# Patient Record
Sex: Female | Born: 1940 | Race: White | Hispanic: No | Smoking: Never smoker
Health system: Southern US, Community
[De-identification: ages and names within clinical notes are randomized; demographics above are authoritative.]

## PROBLEM LIST (undated history)

## (undated) DIAGNOSIS — IMO0002 Reserved for concepts with insufficient information to code with codable children: Secondary | ICD-10-CM

## (undated) DIAGNOSIS — E119 Type 2 diabetes mellitus without complications: Secondary | ICD-10-CM

## (undated) DIAGNOSIS — D649 Anemia, unspecified: Secondary | ICD-10-CM

## (undated) DIAGNOSIS — J302 Other seasonal allergic rhinitis: Secondary | ICD-10-CM

## (undated) DIAGNOSIS — T753XXA Motion sickness, initial encounter: Secondary | ICD-10-CM

## (undated) DIAGNOSIS — I219 Acute myocardial infarction, unspecified: Secondary | ICD-10-CM

## (undated) DIAGNOSIS — K219 Gastro-esophageal reflux disease without esophagitis: Secondary | ICD-10-CM

## (undated) DIAGNOSIS — N189 Chronic kidney disease, unspecified: Secondary | ICD-10-CM

## (undated) DIAGNOSIS — H269 Unspecified cataract: Secondary | ICD-10-CM

## (undated) DIAGNOSIS — I10 Essential (primary) hypertension: Secondary | ICD-10-CM

## (undated) DIAGNOSIS — T7840XA Allergy, unspecified, initial encounter: Secondary | ICD-10-CM

## (undated) DIAGNOSIS — M1990 Unspecified osteoarthritis, unspecified site: Secondary | ICD-10-CM

## (undated) DIAGNOSIS — Z5189 Encounter for other specified aftercare: Secondary | ICD-10-CM

## (undated) HISTORY — PX: BACK SURGERY: SHX140

## (undated) HISTORY — DX: Type 2 diabetes mellitus without complications: E11.9

## (undated) HISTORY — PX: ABDOMINAL HYSTERECTOMY: SHX81

## (undated) HISTORY — PX: BREAST CYST ASPIRATION: SHX578

## (undated) HISTORY — DX: Unspecified cataract: H26.9

## (undated) HISTORY — DX: Acute myocardial infarction, unspecified: I21.9

## (undated) HISTORY — DX: Allergy, unspecified, initial encounter: T78.40XA

## (undated) HISTORY — PX: OTHER SURGICAL HISTORY: SHX169

## (undated) HISTORY — PX: SPINE SURGERY: SHX786

## (undated) HISTORY — DX: Encounter for other specified aftercare: Z51.89

## (undated) HISTORY — DX: Reserved for concepts with insufficient information to code with codable children: IMO0002

## (undated) HISTORY — PX: DILATION AND CURETTAGE OF UTERUS: SHX78

---
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
## 1960-10-11 HISTORY — PX: NOSE SURGERY: SHX723

## 2005-01-19 ENCOUNTER — Ambulatory Visit: Payer: Self-pay | Admitting: Family Medicine

## 2005-04-23 ENCOUNTER — Ambulatory Visit: Payer: Self-pay | Admitting: Internal Medicine

## 2005-04-23 LAB — HM COLONOSCOPY

## 2006-02-18 ENCOUNTER — Ambulatory Visit: Payer: Self-pay | Admitting: Family Medicine

## 2006-06-30 ENCOUNTER — Ambulatory Visit: Payer: Self-pay | Admitting: Gastroenterology

## 2006-08-02 ENCOUNTER — Ambulatory Visit: Payer: Self-pay | Admitting: Gastroenterology

## 2007-04-11 ENCOUNTER — Ambulatory Visit: Payer: Self-pay | Admitting: Family Medicine

## 2007-09-14 ENCOUNTER — Ambulatory Visit: Payer: Self-pay | Admitting: Family Medicine

## 2007-09-20 ENCOUNTER — Ambulatory Visit: Payer: Self-pay | Admitting: Family Medicine

## 2007-10-25 ENCOUNTER — Ambulatory Visit: Payer: Self-pay | Admitting: Pain Medicine

## 2007-11-07 ENCOUNTER — Ambulatory Visit: Payer: Self-pay | Admitting: Pain Medicine

## 2007-11-23 ENCOUNTER — Ambulatory Visit: Payer: Self-pay | Admitting: Physician Assistant

## 2007-12-05 ENCOUNTER — Ambulatory Visit: Payer: Self-pay | Admitting: Pain Medicine

## 2007-12-19 ENCOUNTER — Ambulatory Visit: Payer: Self-pay | Admitting: Pain Medicine

## 2008-01-09 ENCOUNTER — Ambulatory Visit: Payer: Self-pay | Admitting: Physician Assistant

## 2008-01-23 ENCOUNTER — Ambulatory Visit: Payer: Self-pay | Admitting: Pain Medicine

## 2008-02-13 ENCOUNTER — Ambulatory Visit: Payer: Self-pay | Admitting: Pain Medicine

## 2008-04-05 ENCOUNTER — Ambulatory Visit: Payer: Self-pay | Admitting: Urology

## 2008-04-16 ENCOUNTER — Ambulatory Visit: Payer: Self-pay | Admitting: Family Medicine

## 2008-04-26 ENCOUNTER — Ambulatory Visit: Payer: Self-pay | Admitting: Urology

## 2009-04-09 LAB — HM DEXA SCAN

## 2009-04-17 ENCOUNTER — Ambulatory Visit: Payer: Self-pay | Admitting: Family Medicine

## 2010-07-06 ENCOUNTER — Ambulatory Visit: Payer: Self-pay | Admitting: Family Medicine

## 2011-08-31 ENCOUNTER — Ambulatory Visit: Payer: Self-pay | Admitting: Family Medicine

## 2012-09-05 ENCOUNTER — Ambulatory Visit: Payer: Self-pay | Admitting: Family Medicine

## 2012-09-19 ENCOUNTER — Ambulatory Visit: Payer: Self-pay | Admitting: Family Medicine

## 2013-05-11 ENCOUNTER — Inpatient Hospital Stay: Payer: Self-pay | Admitting: Cardiology

## 2013-05-11 ENCOUNTER — Other Ambulatory Visit: Payer: Self-pay | Admitting: Cardiology

## 2013-05-11 LAB — BASIC METABOLIC PANEL
Anion Gap: 2 — ABNORMAL LOW (ref 7–16)
Chloride: 105 mmol/L (ref 98–107)
Co2: 33 mmol/L — ABNORMAL HIGH (ref 21–32)
EGFR (African American): 56 — ABNORMAL LOW
Glucose: 103 mg/dL — ABNORMAL HIGH (ref 65–99)
Sodium: 140 mmol/L (ref 136–145)

## 2013-05-11 LAB — CBC WITH DIFFERENTIAL/PLATELET
Eosinophil %: 3.2 %
HCT: 39.1 % (ref 35.0–47.0)
HGB: 13.8 g/dL (ref 12.0–16.0)
Lymphocyte %: 22.4 %
MCV: 95 fL (ref 80–100)
Monocyte #: 0.8 x10 3/mm (ref 0.2–0.9)
Monocyte %: 9 %
Neutrophil #: 5.7 10*3/uL (ref 1.4–6.5)
Neutrophil %: 64.6 %
Platelet: 321 10*3/uL (ref 150–440)
RBC: 4.13 10*6/uL (ref 3.80–5.20)
RDW: 13.5 % (ref 11.5–14.5)
WBC: 8.8 10*3/uL (ref 3.6–11.0)

## 2013-05-11 LAB — CK TOTAL AND CKMB (NOT AT ARMC)
CK, Total: 71 U/L (ref 21–215)
CK, Total: 54 U/L (ref 21–215)
CK-MB: 1.4 ng/mL (ref 0.5–3.6)
CK-MB: 1.3 ng/mL (ref 0.5–3.6)

## 2013-05-11 LAB — CK: CK, Total: 77 U/L (ref 21–215)

## 2013-05-11 LAB — TROPONIN I: Troponin-I: 4.05 ng/mL — ABNORMAL HIGH

## 2013-05-12 LAB — CK TOTAL AND CKMB (NOT AT ARMC): CK, Total: 45 U/L (ref 21–215)

## 2013-05-12 LAB — TROPONIN I: Troponin-I: 3.1 ng/mL — ABNORMAL HIGH

## 2013-05-13 LAB — BASIC METABOLIC PANEL
BUN: 15 mg/dL (ref 7–18)
Calcium, Total: 8.4 mg/dL — ABNORMAL LOW (ref 8.5–10.1)
Creatinine: 0.99 mg/dL (ref 0.60–1.30)
EGFR (African American): >60
Glucose: 95 mg/dL (ref 65–99)
Osmolality: 284 (ref 275–301)

## 2013-05-13 LAB — CBC WITH DIFFERENTIAL/PLATELET
Basophil %: 0.9 %
Eosinophil #: 0.3 10*3/uL (ref 0.0–0.7)
Eosinophil %: 2.9 %
HGB: 12.2 g/dL (ref 12.0–16.0)
Lymphocyte #: 2.3 10*3/uL (ref 1.0–3.6)
Lymphocyte %: 21.1 %
MCH: 33.2 pg (ref 26.0–34.0)
MCV: 96 fL (ref 80–100)
Monocyte %: 9.4 %
Platelet: 295 10*3/uL (ref 150–440)
RBC: 3.66 10*6/uL — ABNORMAL LOW (ref 3.80–5.20)
RDW: 13.6 % (ref 11.5–14.5)

## 2013-05-14 DIAGNOSIS — I2510 Atherosclerotic heart disease of native coronary artery without angina pectoris: Secondary | ICD-10-CM | POA: Insufficient documentation

## 2013-05-16 DIAGNOSIS — Z9889 Other specified postprocedural states: Secondary | ICD-10-CM | POA: Insufficient documentation

## 2013-05-17 DIAGNOSIS — Z951 Presence of aortocoronary bypass graft: Secondary | ICD-10-CM | POA: Insufficient documentation

## 2013-05-17 HISTORY — PX: CORONARY ARTERY BYPASS GRAFT: SHX141

## 2013-05-20 DIAGNOSIS — I519 Heart disease, unspecified: Secondary | ICD-10-CM | POA: Insufficient documentation

## 2013-05-21 DIAGNOSIS — D696 Thrombocytopenia, unspecified: Secondary | ICD-10-CM | POA: Insufficient documentation

## 2013-05-21 DIAGNOSIS — IMO0002 Reserved for concepts with insufficient information to code with codable children: Secondary | ICD-10-CM | POA: Insufficient documentation

## 2013-06-08 DIAGNOSIS — J90 Pleural effusion, not elsewhere classified: Secondary | ICD-10-CM | POA: Insufficient documentation

## 2013-06-29 ENCOUNTER — Encounter: Payer: Self-pay | Admitting: Cardiology

## 2013-07-11 ENCOUNTER — Encounter: Payer: Self-pay | Admitting: Cardiology

## 2013-08-11 ENCOUNTER — Encounter: Payer: Self-pay | Admitting: Cardiology

## 2013-10-11 HISTORY — PX: EYE SURGERY: SHX253

## 2013-12-13 ENCOUNTER — Ambulatory Visit: Payer: Self-pay | Admitting: Ophthalmology

## 2013-12-13 DIAGNOSIS — Z01810 Encounter for preprocedural cardiovascular examination: Secondary | ICD-10-CM

## 2013-12-13 DIAGNOSIS — I10 Essential (primary) hypertension: Secondary | ICD-10-CM

## 2013-12-13 LAB — HEMOGLOBIN: HGB: 13.9 g/dL (ref 12.0–16.0)

## 2013-12-13 LAB — POTASSIUM: POTASSIUM: 4.5 mmol/L (ref 3.5–5.1)

## 2013-12-24 ENCOUNTER — Ambulatory Visit: Payer: Self-pay | Admitting: Ophthalmology

## 2014-01-09 ENCOUNTER — Ambulatory Visit: Payer: Self-pay | Admitting: Ophthalmology

## 2014-01-09 LAB — POTASSIUM: Potassium: 4.3 mmol/L (ref 3.5–5.1)

## 2014-01-14 ENCOUNTER — Ambulatory Visit: Payer: Self-pay | Admitting: Ophthalmology

## 2014-02-21 ENCOUNTER — Ambulatory Visit: Payer: Self-pay | Admitting: Family Medicine

## 2014-02-21 LAB — HM MAMMOGRAPHY

## 2014-04-15 DIAGNOSIS — I219 Acute myocardial infarction, unspecified: Secondary | ICD-10-CM | POA: Insufficient documentation

## 2014-04-15 DIAGNOSIS — I252 Old myocardial infarction: Secondary | ICD-10-CM | POA: Insufficient documentation

## 2014-05-08 LAB — HEMOGLOBIN A1C: Hgb A1c MFr Bld: 5.5 % (ref 4.0–6.0)

## 2014-05-10 LAB — BASIC METABOLIC PANEL
BUN: 19 mg/dL (ref 4–21)
Creatinine: 1.2 mg/dL — AB (ref 0.5–1.1)
GLUCOSE: 94 mg/dL
POTASSIUM: 4.8 mmol/L (ref 3.4–5.3)
Sodium: 138 mmol/L (ref 137–147)

## 2014-05-10 LAB — CBC AND DIFFERENTIAL
HCT: 43 % (ref 36–46)
Hemoglobin: 15.2 g/dL (ref 12.0–16.0)
NEUTROS ABS: 3 /uL
PLATELETS: 317 10*3/uL (ref 150–399)
WBC: 6.1 10^3/mL

## 2014-05-10 LAB — HEPATIC FUNCTION PANEL
ALT: 13 U/L (ref 7–35)
AST: 21 U/L (ref 13–35)
Alkaline Phosphatase: 44 U/L (ref 25–125)
BILIRUBIN, TOTAL: 0.8 mg/dL

## 2014-05-10 LAB — LIPID PANEL
CHOLESTEROL: 185 mg/dL (ref 0–200)
HDL: 55 mg/dL (ref 35–70)
LDL Cholesterol: 106 mg/dL
Triglycerides: 118 mg/dL (ref 40–160)

## 2014-05-10 LAB — TSH: TSH: 1.34 u[IU]/mL (ref 0.41–5.90)

## 2014-10-23 DIAGNOSIS — Z951 Presence of aortocoronary bypass graft: Secondary | ICD-10-CM | POA: Diagnosis not present

## 2014-10-23 DIAGNOSIS — I214 Non-ST elevation (NSTEMI) myocardial infarction: Secondary | ICD-10-CM | POA: Diagnosis not present

## 2014-10-23 DIAGNOSIS — I25810 Atherosclerosis of coronary artery bypass graft(s) without angina pectoris: Secondary | ICD-10-CM | POA: Diagnosis not present

## 2014-10-23 DIAGNOSIS — I10 Essential (primary) hypertension: Secondary | ICD-10-CM | POA: Diagnosis not present

## 2015-01-31 NOTE — Discharge Summary (Signed)
PATIENT NAME:  Sheila Kim, GARROW MR#:  [id number] DATE OF BIRTH:  1941/08/12  DISCHARGE / TRANSFER SUMMARY  DATE OF ADMISSION:  05/11/2013 DATE OF DISCHARGE:  05/14/2013  PRIMARY CARE PHYSICIAN:  Venia Minks.  FINAL DIAGNOSES: 1.  Probable anterior myocardial infarction  2.  Hyperlipidemia.  3.  Gastroesophageal reflux disease.  4.  Osteoarthritis.   PROCEDURES: Cardiac catheterization, with selective coronary arteriography on 05/14/2013.   HISTORY OF PRESENT ILLNESS: Please see Admission H and [place] COURSE: The patient was admitted on 05/11/2013 with a recent history of chest pain while vacationing in [place], [place]. The patient returned home only to experience a recurrent episode of chest pain at rest and with minimal exertion. Initial troponin was 4.05 and the patient was admitted to telemetry. Admission EKG was notable for T wave inversions in the anterior leads.   The patient underwent cardiac catheterization on 05/14/2013 which revealed a very complex high-grade 99% stenosis in the mid-left anterior descending coronary artery involving the takeoff of the second anterolateral branch. In addition, there was a 95% stenosis in the ostium of the first anterolateral branch, a 75% stenosis in the proximal left circumflex, and an 80% stenosis in the mid-posterior descending artery.   In summary, the patient has significant three-vessel coronary artery disease. Left ventriculography revealed preserved left ventricular function with mild apical wall akinesis. The patient is being transferred to [facility] for urgent coronary artery bypass graft surgery. She is transferred in stable condition.    ____________________________ Isaias Cowman, MD ap:dm D: 05/14/2013 08:45:00 ET T: 05/14/2013 09:08:24 ET JOB#: [id number]  cc: Isaias Cowman, MD, <Dictator> Isaias Cowman MD ELECTRONICALLY SIGNED 06/06/2013 15:59

## 2015-01-31 NOTE — Consult Note (Signed)
Chief Complaint:  Subjective/Chief Complaint Doing well denies further cp symptoms. States to Sheila Kim the risks and benefits to cath.   VITAL SIGNS/ANCILLARY NOTES: **Vital Signs.:   03-Aug-14 12:32  Pulse Pulse 80  Systolic BP Systolic BP 257  Diastolic BP (mmHg) Diastolic BP (mmHg) 76  Mean BP 92  *Intake and Output.:   03-Aug-14 11:38  Grand Totals Intake:   Output:  300    Net:  -300 24 Hr.:  -300  Urine ml     Out:  300  Urinary Method  Up to BR  Stool  large bowel movement   Brief Assessment:  GEN well developed, well nourished, no acute distress   Cardiac Regular  murmur present  --Gallop   Respiratory normal resp effort  clear BS   Gastrointestinal Normal   Gastrointestinal details normal Soft   Lab Results: Routine Chem:  03-Aug-14 04:52   Glucose, Serum 95  BUN 15  Creatinine (comp) 0.99  Sodium, Serum 142  Potassium, Serum 3.9  Chloride, Serum  109  CO2, Serum 29  Calcium (Total), Serum  8.4  Anion Gap  4  Osmolality (calc) 284  eGFR (African American) >60  eGFR (Non-African American)  56 (eGFR values <66m/min/1.73 m2 may be an indication of chronic kidney disease (CKD). Calculated eGFR is useful in patients with stable renal function. The eGFR calculation will not be reliable in acutely ill patients when serum creatinine is changing rapidly. It is not useful in  patients on dialysis. The eGFR calculation may not be applicable to patients at the low and high extremes of body sizes, pregnant women, and vegetarians.)  Routine Hem:  03-Aug-14 04:52   WBC (CBC) 11.0  RBC (CBC)  3.66  Hemoglobin (CBC) 12.2  Hematocrit (CBC) 35.1  Platelet Count (CBC) 295  MCV 96  MCH 33.2  MCHC 34.7  RDW 13.6  Neutrophil % 65.7  Lymphocyte % 21.1  Monocyte % 9.4  Eosinophil % 2.9  Basophil % 0.9  Neutrophil #  7.2  Lymphocyte # 2.3  Monocyte #  1.0  Eosinophil # 0.3  Basophil # 0.1 (Result(s) reported on 13 May 2013 at 0[facility])   Radiology  Results: XRay:    01-Aug-14 13:48, Chest PA and Lateral  Chest PA and Lateral   REASON FOR EXAM:    SOB  COMMENTS:       PROCEDURE: DXR - DXR CHEST PA (OR AP) AND LATERAL  - May 11 2013  1:48PM     RESULT: Atherosclerotic calcification is present within the aorta. The   heart size is normal. There is some lobulation on the rightside of the   diaphragm. There is no edema, infiltrate, effusion or pneumothorax. The   bony structures appear intact. Cardiac monitoring electrodes are present.    IMPRESSION:   1. No acute cardiopulmonary disease evident.    Dictation Site: 2    Verified By: GSundra Aland M.D., MD   Assessment/Plan:  Invasive Device Daily Assessment of Necessity:  Does the patient currently have any of the following indwelling devices? none   Assessment/Plan:  Assessment IMP S/P Acute MI anterior Abn Ekg HTN Possible hyperlipidemia Elevated troponin .   Plan PLAN Tele ASA 81 mg daily Plavix 75 daily Low dose B-blockers ACE low dose DVT PROPHYLASIX Hold off on NTG since no cp currently ECHO Cardiac cath MOnday with AP   Electronic Signatures: CYolonda Kida(MD)  (Signed 04-Aug-14 07:02)  Authored: Chief Complaint, VITAL SIGNS/ANCILLARY NOTES, Brief Assessment,  Lab Results, Radiology Results, Assessment/Plan   Last Updated: 04-Aug-14 07:02 by Lujean Amel D (MD)

## 2015-01-31 NOTE — H&P (Signed)
PATIENT NAME:  Sheila Kim, Sheila Kim MR#:  [id number] DATE OF BIRTH:  [birth year]  DATE OF ADMISSION:  05/11/2013  PRIMARY CARE PHYSICIAN: Jerrell Belfast, MD  CHIEF COMPLAINT: Chest pain.   REASON FOR ADMISSION: The patient admitted for probable anterior myocardial infarction.   HISTORY OF PRESENT ILLNESS: The patient is a 74 year old female without history of prior coronary artery disease. The patient apparently was in her usual state of health until recently, when she was vacationing in [place], [place], from 04/27/2013 to 05/08/2013. The patient started to experience chest discomfort when she first boarded the airplane to [place], [place]. While she there, she was experiencing exertional substernal chest discomfort. On her return to the airport, the patient had another prolonged episode of chest pain which lasted several hours. After returning home, the patient has had some recurrent episodes of chest pain with exertion. Yesterday, she experienced an episode of chest pain which lasted less than 10 minutes while walking on the treadmill. She has noted associated mild exertional dyspnea   SOCIAL HISTORY: The patient is married. She lives with her husband. She has 2 children. She has never smoked.   FAMILY HISTORY: Father died status post MI at age 11. Mother died at age 85 with a history of heart disease.   REVIEW OF SYSTEMS:  CONSTITUTIONAL: No fever or chills.  EYES: No blurry vision.  EARS: No hearing loss.  RESPIRATORY: No shortness of breath.  CARDIOVASCULAR: Chest pain as described above.  GASTROINTESTINAL: No nausea, vomiting or diarrhea.  GENITOURINARY: No dysuria or hematuria.  ENDOCRINE: No polyuria or polydipsia.  MUSCULOSKELETAL: No arthralgias or myalgias.  NEUROLOGICAL: No focal muscle weakness or numbness.  PSYCHOLOGICAL: No depression or anxiety.   PHYSICAL EXAMINATION:  VITAL SIGNS: Weight 117.4, height 4 feet 11 inches, BMI 24. Blood pressure 122/62 left arm, pulse 64  and regular.  HEENT: Pupils equally reactive to light and accommodation.  NECK: Supple without thyromegaly.  LUNGS: Clear.  CARDIOVASCULAR: Normal JVP. Normal PMI. Regular rate and rhythm. Normal S1, S2. No appreciable gallop, murmur or rub.  ABDOMEN: Soft and nontender. Pulses were intact bilaterally.  MUSCULOSKELETAL: Normal muscle tone.  NEUROLOGICAL: The patient is alert and oriented x3. Motor and sensory both grossly intact.   ACCESSORY DATA: EKG reveals sinus rhythm at a rate of 65 BPM with evidence of septal infarct and T wave inversions in the anterior leads.   IMPRESSION: A 74 year old female with 1-week history of recurrent episodes of chest pain with exertion and a prolonged episode at rest. Electrocardiogram reveals evidence for old anteroseptal myocardial infarction with T wave inversions suggestive of an evolving myocardial infarction. Laboratory work today included a troponin of 4.05 with negative CPK-MB. Myocardial infarction may have occurred over a week ago, with residual elevation in troponin; however, the patient has continued to experience exertional chest pain.   RECOMMENDATIONS:  1. Admit to telemetry. 2. Cycle cardiac enzymes.  3. A 2-D echocardiogram to evaluate left ventricular function.  4. Proceed to cardiac catheterization with selective coronary arteriography on 05/14/2013. The risks, benefits and alternatives were explained and informed written consent obtained.   ____________________________ Isaias Cowman, MD ap:OSi D: 05/11/2013 11:55:30 ET T: 05/11/2013 12:21:15 ET JOB#: [id number]  cc: Isaias Cowman, MD, <Dictator> Isaias Cowman MD ELECTRONICALLY SIGNED 06/06/2013 15:59

## 2015-01-31 NOTE — Consult Note (Signed)
Chief Complaint:  Subjective/Chief Complaint Denies cp no sob feels ok at rest   VITAL SIGNS/ANCILLARY NOTES: **Vital Signs.:   02-Aug-14 11:49  Vital Signs Type Q 4hr  Temperature Temperature (F) 97.8  Celsius 36.5  Temperature Source oral  Pulse Pulse 71  Respirations Respirations 16  Systolic BP Systolic BP 025  Diastolic BP (mmHg) Diastolic BP (mmHg) 82  Mean BP 100  Pulse Ox % Pulse Ox % 99  Pulse Ox Activity Level  At rest  Oxygen Delivery Room Air/ 21 %  *Intake and Output.:   02-Aug-14 11:51  Grand Totals Intake:   Output:  500    Net:  -78 24 Hr.:  -140  Urine ml     Out:  500  Urinary Method  Up to BR  Stool  moderate amount of stool   Brief Assessment:  GEN well developed, well nourished, no acute distress   Cardiac Regular  murmur present  --Gallop   Respiratory normal resp effort  clear BS   Gastrointestinal Normal   Gastrointestinal details normal Soft   Lab Results: LabObservation:  01-Aug-14 14:10   OBSERVATION Reason for Test  Routine Chem:  01-Aug-14 12:43   Result Comment TROPONIN - RESULTS VERIFIED BY REPEAT TESTING.  - C/ SUSAN PIESTO @1435  05/11/13 BY K[facility]  - READ-BACK PROCESS PERFORMED.  Result(s) reported on 11 May 2013 at 02:37PM.  Glucose, Serum  103  BUN  22  Creatinine (comp) 1.12  Sodium, Serum 140  Potassium, Serum 4.3  Chloride, Serum 105  CO2, Serum  33  Calcium (Total), Serum 9.1  Anion Gap  2  Osmolality (calc) 283  eGFR (African American)  56  eGFR (Non-African American)  48 (eGFR values <35m/min/1.73 m2 may be an indication of chronic kidney disease (CKD). Calculated eGFR is useful in patients with stable renal function. The eGFR calculation will not be reliable in acutely ill patients when serum creatinine is changing rapidly. It is not useful in  patients on dialysis. The eGFR calculation may not be applicable to patients at the low and high extremes of body sizes, pregnant women, and vegetarians.)    20:54    Result Comment TROPONIN - RESULTS VERIFIED BY REPEAT TESTING.  - ELEVATED TROPONIN PREVIOUSLY CALLED AT  - 1042 05/11/13.PMH  Result(s) reported on 11 May 2013 at 09:54PM.  02-Aug-14 04:52   Result Comment TROPONIN - RESULTS VERIFIED BY REPEAT TESTING.  - ELEVATED TROPONIN PREVIOUSLY CALLED AT  - 1042 05/11/13.PMH  Result(s) reported on 12 May 2013 at 05:57AM.  Cardiac:  01-Aug-14 12:43   Troponin I  3.61 (0.00-0.05 0.05 ng/mL or less: NEGATIVE  Repeat testing in 3-6 hrs  if clinically indicated. >0.05 ng/mL: POTENTIAL  MYOCARDIAL INJURY. Repeat  testing in 3-6 hrs if  clinically indicated. NOTE: An increase or decrease  of 30% or more on serial  testing suggests a  clinically important change)  CK, Total 71  CPK-MB, Serum 1.4 (Result(s) reported on 11 May 2013 at 01:11PM.)    20:54   Troponin I  4.10 (0.00-0.05 0.05 ng/mL or less: NEGATIVE  Repeat testing in 3-6 hrs  if clinically indicated. >0.05 ng/mL: POTENTIAL  MYOCARDIAL INJURY. Repeat  testing in 3-6 hrs if  clinically indicated. NOTE: An increase or decrease  of 30% or more on serial  testing suggests a  clinically important change)  CK, Total 54  CPK-MB, Serum 1.3 (Result(s) reported on 11 May 2013 at 09:44PM.)  02-Aug-14 04:52   Troponin I  3.10 (  0.00-0.05 0.05 ng/mL or less: NEGATIVE  Repeat testing in 3-6 hrs  if clinically indicated. >0.05 ng/mL: POTENTIAL  MYOCARDIAL INJURY. Repeat  testing in 3-6 hrs if  clinically indicated. NOTE: An increase or decrease  of 30% or more on serial  testing suggests a  clinically important change)  CK, Total 45  CPK-MB, Serum 1.3 (Result(s) reported on 12 May 2013 at 05:56AM.)  Routine Hem:  01-Aug-14 12:43   WBC (CBC) 8.8  RBC (CBC) 4.13  Hemoglobin (CBC) 13.8  Hematocrit (CBC) 39.1  Platelet Count (CBC) 321  MCV 95  MCH 33.4  MCHC 35.3  RDW 13.5  Neutrophil % 64.6  Lymphocyte % 22.4  Monocyte % 9.0  Eosinophil % 3.2  Basophil % 0.8  Neutrophil # 5.7   Lymphocyte # 2.0  Monocyte # 0.8  Eosinophil # 0.3  Basophil # 0.1 (Result(s) reported on 11 May 2013 at 01:11PM.)   Radiology Results: XRay:    01-Aug-14 13:48, Chest PA and Lateral  Chest PA and Lateral   REASON FOR EXAM:    SOB  COMMENTS:       PROCEDURE: DXR - DXR CHEST PA (OR AP) AND LATERAL  - May 11 2013  1:48PM     RESULT: Atherosclerotic calcification is present within the aorta. The   heart size is normal. There is some lobulation on the rightside of the   diaphragm. There is no edema, infiltrate, effusion or pneumothorax. The   bony structures appear intact. Cardiac monitoring electrodes are present.    IMPRESSION:   1. No acute cardiopulmonary disease evident.    Dictation Site: 2    Verified By: Sundra Aland, M.D., MD   Assessment/Plan:  Invasive Device Daily Assessment of Necessity:  Does the patient currently have any of the following indwelling devices? none   Assessment/Plan:  Assessment IMP S/P Acute MI anterior Abn Ekg HTN Possible hyperlipidemia Elevated troponin .   Plan PLAN Tele ASA 81 mg daily Plavix 75 daily Low dose B-blockers ACE low dose DVT PROPHYLASIX Hold off on NTG since no cp currently ECHO Cardiac cath MOnday with AP   Electronic Signatures: Yolonda Kida (MD)  (Signed 02-Aug-14 23:17)  Authored: Chief Complaint, VITAL SIGNS/ANCILLARY NOTES, Brief Assessment, Lab Results, Radiology Results, Assessment/Plan   Last Updated: 02-Aug-14 23:17 by Lujean Amel D (MD)

## 2015-02-01 NOTE — Op Note (Signed)
PATIENT NAME:  Sheila Kim, Sheila Kim MR#:  [id number] DATE OF BIRTH:  1941/01/13  DATE OF PROCEDURE:  12/24/2013  PREOPERATIVE DIAGNOSIS: Cataract, right eye.   POSTOPERATIVE DIAGNOSIS: Cataract, right eye.   PROCEDURE PERFORMED: Extracapsular cataract extraction using phacoemulsification with placement of Alcon SN6CWS, 23.5-diopter posterior chamber lens, serial number [id number].   SURGEON: Loura Back. Clio Gerhart, M.D.   ANESTHESIA: 4% lidocaine and 0.75% Marcaine in a 50-50 mixture with 10 units/mL of Hylenex added, given as a peribulbar.   ANESTHESIOLOGIST: Amy M. Rice, MD  COMPLICATIONS: None.   ESTIMATED BLOOD LOSS: Less than 1 mL.   DESCRIPTION OF PROCEDURE: The patient was brought to the operating room and given a peribulbar block after IV sedation. She was then prepped and draped in the usual fashion. BECAUSE OF A LATEX ALLERGY, ALL LATEX-FREE MATERIALS WERE USED. The patient was prepped and draped in the usual fashion. The vertical rectus muscles were imbricated using 5-0 silk sutures, bridle sutures, and cautery was performed at the limbus superiorly. Partial-thickness scleral groove was made at the posterior surgical limbus and dissected anteriorly into clear cornea with an Alcon crescent knife. The anterior chamber was entered superonasally through clear cornea and through the lamellar dissection with a 2.6 mm keratome. DisCoVisc was used to place the aqueous. Continuous tear capsulorrhexis was carried out. Phacoemulsification was carried out in divide-and-conquer technique. Ultrasound time was 1 minute 23 seconds with an average power of 25.5%, CDE of 36.38. The intraocular lens was inserted into the capsule. Capsular bag was inflated with DisCoVisc and the intraocular lens was inserted in the capsular bag using an AcrySert delivery system. Irrigation-aspiration was used to remove the residual DisCoVisc. The wound was inflated with balanced salt and Miochol was injected through the  paracentesis track. Cefuroxime 0.1 mg containing 1 mg of drug was also injected via the paracentesis track. The wound was checked for leaks. None were found. The bridle sutures were removed and 2 drops of Vigamox were placed in the eye. A shield was placed on the eye and the patient was discharged to the recovery room in good condition.    ____________________________ Loura Back Consuelo Thayne, MD sad:jcm D: 12/24/2013 13:45:13 ET T: 12/24/2013 21:35:23 ET JOB#: [id number]  cc: Remo Lipps A. Franciszek Platten, MD, <Dictator> Martie Lee MD ELECTRONICALLY SIGNED 12/31/2013 13:55

## 2015-02-01 NOTE — Op Note (Signed)
PATIENT NAME:  Sheila Kim, Sheila Kim MR#:  [id number] DATE OF BIRTH:  01-Aug-1941  DATE OF PROCEDURE:  01/14/2014   PREOPERATIVE DIAGNOSIS: Cataract, left eye.   POSTOPERATIVE DIAGNOSIS: Cataract, left eye.  PROCEDURE PERFORMED: Extracapsular cataract extraction using phacoemulsification with placement of Alcon SN6CWS, 23.0-diopter posterior chamber lens, serial number [id number].   SURGEON: Loura Back. Galaxy Borden, M.D.   ASSISTANT:  None.  ANESTHESIA: 4% lidocaine with 0.75% Marcaine with 50-50 mixture of 10 units/mL of Hylenex added, given as a peribulbar.   ANESTHESIOLOGIST: Dr. Benjamine Mola.   COMPLICATIONS: None.   ESTIMATED BLOOD LOSS: Less than 1 mL.   DESCRIPTION OF PROCEDURE: Latex-free material was used throughout the procedure due LATEX ALLERGY.  The patient was brought to the operating room, placed supine and given IV sedation. A peribulbar block was then performed. The patient was then prepped and draped in the usual fashion. The vertical rectus muscles were imbricated using 5-0 silk sutures, bridle sutures. Hemostasis was obtained at the limbus with cautery and a partial thickness scleral groove was made at the surgical limbus. This was dissected anteriorly into clear cornea with an Alcon crescent knife. The anterior chamber was entered superotemporally through clear cornea with a paracentesis knife and through the lamellar dissection with a 2.6 mm keratome. DisCoVisc was used to replace the aqueous and a continuous tear of circular capsulorrhexis was carried out. Hydrodissection was used to loosen the nucleus and phacoemulsification was carried out in a divide-and-conquer technique. Ultrasound time was 1 minute and 34 seconds. Average power was 23.8 and CDE was 43.17. Irrigation and aspiration were used to remove the residual cortex. The capsular bag was inflated with DisCoVisc and the intraocular lens was inserted into the capsular bag under direct visualization using a Librarian, academic.  Irrigation and aspiration was used to remove residual DisCoVisc. The wound was inflated with balanced salt and Miochol was injected via the paracentesis track.  0.1 mL of cefuroxime containing 1 mg of drug was injected through the paracentesis as well. The wound was checked for leaks and none were found. The bridle sutures were removed. Two drops of Vigamox were placed in the eye, a shield was placed on the eye, and the patient was discharged to the recovery area in good condition.    ____________________________ Loura Back Kyrese Gartman, MD sad:dmm D: 01/14/2014 12:09:45 ET T: 01/14/2014 12:24:52 ET JOB#: [id number]  cc: Remo Lipps A. Zaeda Mcferran, MD, <Dictator> Martie Lee MD ELECTRONICALLY SIGNED 01/21/2014 12:23

## 2015-02-12 DIAGNOSIS — K449 Diaphragmatic hernia without obstruction or gangrene: Secondary | ICD-10-CM

## 2015-02-12 DIAGNOSIS — N289 Disorder of kidney and ureter, unspecified: Secondary | ICD-10-CM | POA: Insufficient documentation

## 2015-02-12 DIAGNOSIS — E559 Vitamin D deficiency, unspecified: Secondary | ICD-10-CM | POA: Insufficient documentation

## 2015-02-12 DIAGNOSIS — Z981 Arthrodesis status: Secondary | ICD-10-CM | POA: Insufficient documentation

## 2015-02-12 DIAGNOSIS — J309 Allergic rhinitis, unspecified: Secondary | ICD-10-CM | POA: Insufficient documentation

## 2015-02-12 DIAGNOSIS — E7800 Pure hypercholesterolemia, unspecified: Secondary | ICD-10-CM | POA: Insufficient documentation

## 2015-02-12 DIAGNOSIS — I2510 Atherosclerotic heart disease of native coronary artery without angina pectoris: Secondary | ICD-10-CM | POA: Insufficient documentation

## 2015-02-12 DIAGNOSIS — M810 Age-related osteoporosis without current pathological fracture: Secondary | ICD-10-CM | POA: Insufficient documentation

## 2015-02-12 DIAGNOSIS — M1990 Unspecified osteoarthritis, unspecified site: Secondary | ICD-10-CM | POA: Insufficient documentation

## 2015-02-12 DIAGNOSIS — R739 Hyperglycemia, unspecified: Secondary | ICD-10-CM | POA: Insufficient documentation

## 2015-02-12 DIAGNOSIS — K219 Gastro-esophageal reflux disease without esophagitis: Secondary | ICD-10-CM | POA: Insufficient documentation

## 2015-02-12 DIAGNOSIS — N182 Chronic kidney disease, stage 2 (mild): Secondary | ICD-10-CM | POA: Insufficient documentation

## 2015-02-12 DIAGNOSIS — I130 Hypertensive heart and chronic kidney disease with heart failure and stage 1 through stage 4 chronic kidney disease, or unspecified chronic kidney disease: Secondary | ICD-10-CM | POA: Insufficient documentation

## 2015-02-12 DIAGNOSIS — N189 Chronic kidney disease, unspecified: Secondary | ICD-10-CM | POA: Insufficient documentation

## 2015-02-12 DIAGNOSIS — R7303 Prediabetes: Secondary | ICD-10-CM | POA: Insufficient documentation

## 2015-02-12 DIAGNOSIS — I10 Essential (primary) hypertension: Secondary | ICD-10-CM | POA: Insufficient documentation

## 2015-02-12 DIAGNOSIS — K5790 Diverticulosis of intestine, part unspecified, without perforation or abscess without bleeding: Secondary | ICD-10-CM | POA: Insufficient documentation

## 2015-02-12 DIAGNOSIS — K529 Noninfective gastroenteritis and colitis, unspecified: Secondary | ICD-10-CM | POA: Insufficient documentation

## 2015-02-12 DIAGNOSIS — Z8614 Personal history of Methicillin resistant Staphylococcus aureus infection: Secondary | ICD-10-CM | POA: Insufficient documentation

## 2015-03-18 ENCOUNTER — Ambulatory Visit (INDEPENDENT_AMBULATORY_CARE_PROVIDER_SITE_OTHER): Payer: Medicare Other | Admitting: Family Medicine

## 2015-03-18 ENCOUNTER — Encounter: Payer: Self-pay | Admitting: Family Medicine

## 2015-03-18 VITALS — BP 120/80 | HR 74 | Temp 98.1°F | Resp 16 | Ht <= 58 in | Wt 111.0 lb

## 2015-03-18 DIAGNOSIS — R739 Hyperglycemia, unspecified: Secondary | ICD-10-CM

## 2015-03-18 DIAGNOSIS — Z0000 Encounter for general adult medical examination without abnormal findings: Secondary | ICD-10-CM | POA: Diagnosis not present

## 2015-03-18 DIAGNOSIS — I10 Essential (primary) hypertension: Secondary | ICD-10-CM | POA: Diagnosis not present

## 2015-03-18 DIAGNOSIS — E7800 Pure hypercholesterolemia, unspecified: Secondary | ICD-10-CM

## 2015-03-18 DIAGNOSIS — E559 Vitamin D deficiency, unspecified: Secondary | ICD-10-CM

## 2015-03-18 DIAGNOSIS — N182 Chronic kidney disease, stage 2 (mild): Secondary | ICD-10-CM

## 2015-03-18 NOTE — Progress Notes (Signed)
Patient ID: Sheila Kim, female   DOB: Aug 29, 1941, 74 y.o.   MRN: [id number] Patient: Sheila Kim, Female    DOB: August 13, 1941, 74 y.o.   MRN: [id number] Visit Date: 03/18/2015  Today's Provider: Margarita Rana, MD   Chief Complaint  Patient presents with  . Medicare Wellness   Subjective:    Annual wellness visit Sheila Kim is a 74 y.o. female who presents today for her Subsequent Annual Wellness Visit. She feels well. She reports exercising 6 days a week. She reports she is sleeping fairly well. 04/09/2009 BMD: osteoporosis 02/21/2014 Mammo: BI-RADS 1 07/16/2014 Prevnar 13 08/21/2005 Pneumovax 07/01/2011 Tdap 10/01/2011 Zoster    Review of Systems  Constitutional: Negative.   HENT: Negative.   Eyes: Positive for redness and itching.  Respiratory: Negative.   Cardiovascular: Negative.   Gastrointestinal: Negative.   Endocrine: Negative.   Genitourinary: Positive for enuresis.  Musculoskeletal: Positive for myalgias and back pain.  Skin: Negative.   Allergic/Immunologic: Positive for environmental allergies.  Neurological: Positive for dizziness and tremors.  Hematological: Negative.   Psychiatric/Behavioral: Negative.     History   Social History  . Marital Status: Married    Spouse Name: Hassell Done  . Number of Children: 2  . Years of Education: N/A   Occupational History  . retired    Social History Main Topics  . Smoking status: Never Smoker   . Smokeless tobacco: Never Used  . Alcohol Use: No  . Drug Use: No  . Sexual Activity: Not on file   Other Topics Concern  . Not on file   Social History Narrative    Patient Active Problem List   Diagnosis Date Noted  . Allergic rhinitis 02/12/2015  . Atherosclerosis of coronary artery 02/12/2015  . Chronic kidney disease (CKD), stage II (mild) 02/12/2015  . Colitis 02/12/2015  . DD (diverticular disease) 02/12/2015  . Gastroesophageal reflux disease with hiatal hernia 02/12/2015  .  Personal history of methicillin resistant Staphylococcus aureus 02/12/2015  . H/O arthrodesis 02/12/2015  . Hypercholesteremia 02/12/2015  . Blood glucose elevated 02/12/2015  . BP (high blood pressure) 02/12/2015  . Heart & renal disease, hypertensive, with heart failure 02/12/2015  . Arthritis, degenerative 02/12/2015  . OP (osteoporosis) 02/12/2015  . Impaired renal function 02/12/2015  . Avitaminosis D 02/12/2015  . Heart attack 04/15/2014  . Pleural cavity effusion 06/08/2013  . Change in blood platelet count 05/21/2013  . Disorder of right ventricle of heart 05/20/2013  . H/O coronary artery bypass surgery 05/17/2013  . Arteriosclerosis of coronary artery 05/14/2013    Past Surgical History  Procedure Laterality Date  . 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 Back surgery    . Ureteropelvic junction obstruction    . Abdominal hysterectomy    . Nose surgery  1962    deviated septum, hemorrhaged- pt had to return to hospital  . Tonsillectomy  1944  . Dilation and curettage of uterus    . Eye surgery Bilateral 2015    cataract    Her family history includes COPD in her father; Diabetes in her father, mother, and sister; Heart attack in her father and mother; Heart disease in her mother and sister; Stroke in her brother.    Previous Medications   ACETAMINOPHEN (TYLENOL) 500 MG TABLET    Take by mouth.   ALPHA-D-GALACTOSIDASE (BEANO PO)    Take by mouth.   ALPHA-D-GALACTOSIDASE (BEANO TO GO) TABS    Take by mouth.  ASPIRIN 81 MG TABLET    Take by mouth.   CALCIUM CITRATE PO    Take by mouth.   CHOLECALCIFEROL (VITAMIN D) 1000 UNITS TABLET    Take by mouth.   COENZYME Q10 (COQ10) 100 MG CAPS    Take by mouth.   CYANOCOBALAMIN 1000 MCG TABLET    Take by mouth.   DIFLUPREDNATE 0.05 % EMUL    Apply to eye.   FERROUS SULFATE 325 (65 FE) MG TABLET    Take by mouth.   FOLIC ACID (FOLVITE) 1 MG TABLET    Take by mouth.   FUROSEMIDE (LASIX) 40 MG TABLET     Take by mouth.   KETOTIFEN (ZADITOR) 0.025 % OPHTHALMIC SOLUTION    1 drop 2 (two) times daily.   LIDOCAINE (LIDODERM) 5 %    LIDODERM, 5% (External Patch)  one to three Patch Patch Patch may apply up to 3 x 12 hours then off 12 hours for 0 days  Quantity: 30;  Refills: 5   Ordered :28-Sep-2013  [name] ;  Started 28-Sep-2013 Active   LORATADINE (CLARITIN) 10 MG TABLET    Take by mouth.   MELATONIN EXTRA STRENGTH PO    Take 0.25 tablets by mouth at bedtime as needed.   MOMETASONE (NASONEX) 50 MCG/ACT NASAL SPRAY    2 sprays every evening.   MULTIPLE VITAMINS-MINERALS ([facility] SPECTRAVITE ADULT 50+ PO)    Education officer, community - Historical Medication  daily Active   NEPAFENAC (ILEVRO) 0.3 % OPHTHALMIC SUSPENSION    Apply to eye.   PANTOPRAZOLE (PROTONIX) 40 MG TABLET    Take by mouth.   POLYETHYLENE GLYCOL 3350 POWD    Take by mouth.   POTASSIUM CHLORIDE SA (K-DUR,KLOR-CON) 20 MEQ TABLET    Take by mouth.   PROBIOTIC PRODUCT ([facility]S SENIOR PROBIOTIC) CAPS    Take by mouth.   PROPRANOLOL (INDERAL) 40 MG TABLET    Take by mouth.   PSYLLIUM HUSK POWD    Take by mouth.   PYRIDOXINE (B-6) 100 MG TABLET    Take by mouth.   RANITIDINE (ZANTAC) 150 MG TABLET    Take by mouth.   ROSUVASTATIN (CRESTOR) 5 MG TABLET    Take by mouth.    Patient Care Team: Margarita Rana, MD as PCP - General (Family Medicine)     Objective:   Vitals: BP 120/80 mmHg  Pulse 74  Temp(Src) 98.1 F (36.7 C) (Oral)  Resp 16  Ht 4' (1.219 m)  Wt 111 lb (50.349 kg)  BMI 33.88 kg/m2  Physical Exam  Constitutional: She is oriented to person, place, and time. She appears well-developed and well-nourished.  HENT:  Head: Normocephalic and atraumatic.  Right Ear: Tympanic membrane, external ear and ear canal normal.  Left Ear: Tympanic membrane, external ear and ear canal normal.  Nose: Nose normal.  Mouth/Throat: Uvula is midline, oropharynx is clear and moist and mucous membranes are normal.  Eyes:  Conjunctivae, EOM and lids are normal. Pupils are equal, round, and reactive to light.  Neck: Trachea normal and normal range of motion. Neck supple. Carotid bruit is not present. No thyroid mass and no thyromegaly present.  Cardiovascular: Normal rate, regular rhythm and normal heart sounds.   Pulmonary/Chest: Effort normal and breath sounds normal.  Abdominal: Soft. Normal appearance and bowel sounds are normal. There is no hepatosplenomegaly. There is no tenderness.  Genitourinary: No breast swelling, tenderness or discharge.  Musculoskeletal: Normal range of motion.  Lymphadenopathy:  She has no cervical adenopathy.    She has no axillary adenopathy.  Neurological: She is alert and oriented to person, place, and time. She has normal strength. No cranial nerve deficit.  Skin: Skin is warm, dry and intact.  Well healed surgical scar on sternum  Psychiatric: She has a normal mood and affect. Her speech is normal and behavior is normal. Judgment and thought content normal. Cognition and memory are normal.    Activities of Daily Living In your present state of health, do you have any difficulty performing the following activities: 03/18/2015 03/18/2015  Hearing? - N  Vision? N N  Difficulty concentrating or making decisions? N N  Walking or climbing stairs? N N  Dressing or bathing? N N  Doing errands, shopping? N N    Fall Risk Assessment Fall Risk  03/18/2015  Falls in the past year? No     Depression Screen PHQ 2/9 Scores 03/18/2015  PHQ - 2 Score 0    Cognitive Testing - 6-CIT    Year: 0 4 points  Month: 0 3 points  Memorize "Pia Mau, [address], [place]"  Time (within 1 hour:) 0 3 points  Count backwars from 20: 0 2 4 points  Name months of year: 0 2 4 points  Repeat Address: 0 2 4 6 8 10  points   Total Score: 0/28  Interpretation : Normal (0-7) Abnormal (8-28)    Assessment & Plan:    1. Essential hypertension - TSH - Comprehensive metabolic panel - CBC  with Differential/Platelet  2. Chronic kidney disease (CKD), stage II (mild)    Stable  3. Blood glucose elevated  - Hemoglobin A1c  4. Avitaminosis D  - Vit D  25 hydroxy (rtn osteoporosis monitoring)  5. Hypercholesteremia  - Lipid panel  6. Medicare annual wellness visit, subsequent    Stable. Patient advised to continue eating healthy and exercise daily.  - MM DIGITAL SCREENING BILATERAL   Annual Wellness Visit  Reviewed patient's Family Medical History Reviewed and updated list of patient's medical providers Assessment of cognitive impairment was done Assessed patient's functional ability Established a written schedule for health screening Bee Completed and Reviewed  Exercise Activities and Dietary recommendations Goals    None       There is no immunization history on file for this patient.  Health Maintenance  Topic Date Due  . TETANUS/TDAP  12/04/1957  . ZOSTAVAX  12/04/1998  . PNA vac Low Risk Adult (1 of 2 - PCV13) 12/05/2003  . COLONOSCOPY  04/24/2015  . INFLUENZA VACCINE  05/12/2015  . DEXA SCAN  Completed      Discussed health benefits of physical activity, and encouraged her to engage in regular exercise appropriate for her age and condition.    ------------------------------------------------------------------------------------------------------------   Patient seen and examined by Dr. Jerrell Belfast, and note scribed by Philbert Riser. Dimas, CMA.    I have reviewed the document for accuracy and completeness and I agree with above. Jerrell Belfast, MD

## 2015-03-19 LAB — CBC WITH DIFFERENTIAL/PLATELET
BASOS ABS: 0.1 10*3/uL (ref 0.0–0.2)
Basos: 1 %
EOS (ABSOLUTE): 0.3 10*3/uL (ref 0.0–0.4)
Eos: 5 %
Hematocrit: 41.6 % (ref 34.0–46.6)
Hemoglobin: 14.6 g/dL (ref 11.1–15.9)
IMMATURE GRANS (ABS): 0 10*3/uL (ref 0.0–0.1)
IMMATURE GRANULOCYTES: 0 %
LYMPHS ABS: 2.3 10*3/uL (ref 0.7–3.1)
Lymphs: 35 %
MCH: 33.5 pg — AB (ref 26.6–33.0)
MCHC: 35.1 g/dL (ref 31.5–35.7)
MCV: 95 fL (ref 79–97)
Monocytes Absolute: 0.7 10*3/uL (ref 0.1–0.9)
Monocytes: 10 %
NEUTROS ABS: 3.2 10*3/uL (ref 1.4–7.0)
NEUTROS PCT: 49 %
PLATELETS: 308 10*3/uL (ref 150–379)
RBC: 4.36 x10E6/uL (ref 3.77–5.28)
RDW: 13.3 % (ref 12.3–15.4)
WBC: 6.5 10*3/uL (ref 3.4–10.8)

## 2015-03-19 LAB — HEMOGLOBIN A1C
Est. average glucose Bld gHb Est-mCnc: 117 mg/dL
Hgb A1c MFr Bld: 5.7 % — ABNORMAL HIGH (ref 4.8–5.6)

## 2015-03-20 ENCOUNTER — Telehealth: Payer: Self-pay

## 2015-03-20 LAB — LIPID PANEL
CHOL/HDL RATIO: 4 ratio (ref 0.0–4.4)
CHOLESTEROL TOTAL: 186 mg/dL (ref 100–199)
HDL: 46 mg/dL (ref >39)
LDL CALC: 102 mg/dL — AB (ref 0–99)
TRIGLYCERIDES: 192 mg/dL — AB (ref 0–149)

## 2015-03-20 LAB — COMPREHENSIVE METABOLIC PANEL
ALT: 12 IU/L (ref 0–32)
AST: 20 IU/L (ref 0–40)
Albumin/Globulin Ratio: 2.2 (ref 1.1–2.5)
Albumin: 4.2 g/dL (ref 3.5–4.8)
Alkaline Phosphatase: 49 IU/L (ref 39–117)
BILIRUBIN TOTAL: 0.7 mg/dL (ref 0.0–1.2)
BUN/Creatinine Ratio: 19 (ref 11–26)
BUN: 21 mg/dL (ref 8–27)
CHLORIDE: 97 mmol/L (ref 97–108)
CO2: 26 mmol/L (ref 18–29)
Calcium: 9.8 mg/dL (ref 8.7–10.3)
Creatinine, Ser: 1.08 mg/dL — ABNORMAL HIGH (ref 0.57–1.00)
GFR calc Af Amer: 58 mL/min/{1.73_m2} — ABNORMAL LOW (ref >59)
GFR, EST NON AFRICAN AMERICAN: 50 mL/min/{1.73_m2} — AB (ref >59)
GLUCOSE: 94 mg/dL (ref 65–99)
Globulin, Total: 1.9 g/dL (ref 1.5–4.5)
Potassium: 5 mmol/L (ref 3.5–5.2)
Sodium: 141 mmol/L (ref 134–144)
Total Protein: 6.1 g/dL (ref 6.0–8.5)

## 2015-03-20 LAB — TSH: TSH: 1.26 u[IU]/mL (ref 0.450–4.500)

## 2015-03-20 LAB — VITAMIN D 25 HYDROXY (VIT D DEFICIENCY, FRACTURES): VIT D 25 HYDROXY: 72.9 ng/mL (ref 30.0–100.0)

## 2015-03-20 NOTE — Telephone Encounter (Signed)
-----   Message from Margarita Rana, MD sent at 03/20/2015  6:30 AM EDT ----- Labs look great. HgA1c 5.7. Please notify patient. Thanks.

## 2015-03-20 NOTE — Telephone Encounter (Signed)
Informed pt. Renaldo Fiddler, CMA

## 2015-03-25 ENCOUNTER — Ambulatory Visit
Admission: RE | Admit: 2015-03-25 | Discharge: 2015-03-25 | Disposition: A | Discharge status: Home / Self Care | Payer: Medicare Other | Source: Ambulatory Visit | Attending: Family Medicine | Admitting: Family Medicine

## 2015-03-25 DIAGNOSIS — Z1231 Encounter for screening mammogram for malignant neoplasm of breast: Secondary | ICD-10-CM | POA: Diagnosis present

## 2015-03-25 DIAGNOSIS — Z0000 Encounter for general adult medical examination without abnormal findings: Secondary | ICD-10-CM | POA: Diagnosis present

## 2015-06-19 ENCOUNTER — Other Ambulatory Visit: Payer: Self-pay | Admitting: Family Medicine

## 2015-06-19 DIAGNOSIS — K449 Diaphragmatic hernia without obstruction or gangrene: Secondary | ICD-10-CM

## 2015-06-19 DIAGNOSIS — K219 Gastro-esophageal reflux disease without esophagitis: Secondary | ICD-10-CM

## 2015-06-19 DIAGNOSIS — E7800 Pure hypercholesterolemia, unspecified: Secondary | ICD-10-CM

## 2015-07-26 ENCOUNTER — Ambulatory Visit (INDEPENDENT_AMBULATORY_CARE_PROVIDER_SITE_OTHER): Payer: Medicare Other

## 2015-07-26 DIAGNOSIS — Z23 Encounter for immunization: Secondary | ICD-10-CM | POA: Diagnosis not present

## 2015-09-17 ENCOUNTER — Ambulatory Visit (INDEPENDENT_AMBULATORY_CARE_PROVIDER_SITE_OTHER): Payer: Medicare Other | Admitting: Family Medicine

## 2015-09-17 ENCOUNTER — Encounter: Payer: Self-pay | Admitting: Family Medicine

## 2015-09-17 VITALS — BP 128/88 | HR 72 | Temp 98.1°F | Resp 16 | Wt 117.0 lb

## 2015-09-17 DIAGNOSIS — R739 Hyperglycemia, unspecified: Secondary | ICD-10-CM

## 2015-09-17 DIAGNOSIS — Z1211 Encounter for screening for malignant neoplasm of colon: Secondary | ICD-10-CM

## 2015-09-17 DIAGNOSIS — B354 Tinea corporis: Secondary | ICD-10-CM

## 2015-09-17 LAB — POCT GLYCOSYLATED HEMOGLOBIN (HGB A1C)
Est. average glucose Bld gHb Est-mCnc: 111
Hemoglobin A1C: 5.5

## 2015-09-17 MED ORDER — NYSTATIN 100000 UNIT/GM EX CREA: 1.0000 "application " | TOPICAL_CREAM | Freq: Two times a day (BID) | CUTANEOUS | Status: DC

## 2015-09-17 NOTE — Progress Notes (Signed)
Subjective:    Patient ID: Sheila Kim, female    DOB: [birth year], 74 y.o.   MRN: [id number]  Hypertension This is a chronic problem. The problem is controlled (Monitors it at home. Has been excellent. ). Associated symptoms include anxiety, headaches, malaise/fatigue, neck pain, peripheral edema and sweats. Pertinent negatives include no blurred vision, chest pain, orthopnea, palpitations or shortness of breath. There are no associated agents to hypertension. Risk factors for coronary artery disease include dyslipidemia, family history and post-menopausal state. Past treatments include beta blockers (Propanolol 40 mg.). The current treatment provides moderate improvement. There is no history of angina or CVA.  Hyperglycemia This is a chronic (Last A1C 03/20/2015 and was 5.7%) problem. The problem has been gradually improving. Associated symptoms include headaches, neck pain, a rash and a visual change. Pertinent negatives include no chest pain, fever or urinary symptoms. Nothing aggravates the symptoms. She has tried nothing (except lifesytle.  ) for the symptoms.  Rash This is a new problem. The current episode started more than 1 month ago (since summer. Has tried OTC remedies and comes back. ). The problem has been waxing and waning since onset. The affected locations include the chest (between her breasts. ). The rash is characterized by redness. She was exposed to nothing (aggravated by heat.). Pertinent negatives include no fever or shortness of breath. Treatments tried: baby powder, ant-fungal cream, gold-bond powder, Listerene. The treatment provided mild relief. There is no history of asthma or eczema.      Review of Systems  Constitutional: Positive for malaise/fatigue. Negative for fever, activity change, appetite change and unexpected weight change.  Eyes: Negative for blurred vision.  Respiratory: Negative for apnea and shortness of breath.   Cardiovascular: Negative for  chest pain, palpitations and orthopnea.  Musculoskeletal: Positive for neck pain.  Skin: Positive for rash.  Neurological: Positive for headaches.  Hematological: Bruises/bleeds easily.   BP 128/88 mmHg  Pulse 72  Temp(Src) 98.1 F (36.7 C) (Oral)  Resp 16  Wt 117 lb (53.071 kg)   Patient Active Problem List   Diagnosis Date Noted  . Allergic rhinitis 02/12/2015  . Atherosclerosis of coronary artery 02/12/2015  . Chronic kidney disease (CKD), stage II (mild) 02/12/2015  . Colitis 02/12/2015  . DD (diverticular disease) 02/12/2015  . Gastroesophageal reflux disease with hiatal hernia 02/12/2015  . Personal history of methicillin resistant Staphylococcus aureus 02/12/2015  . H/O arthrodesis 02/12/2015  . Hypercholesteremia 02/12/2015  . Blood glucose elevated 02/12/2015  . BP (high blood pressure) 02/12/2015  . Heart & renal disease, hypertensive, 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[place]) 02/12/2015  . Arthritis, degenerative 02/12/2015  . OP (osteoporosis) 02/12/2015  . Impaired renal function 02/12/2015  . Avitaminosis D 02/12/2015  . Heart attack ([place]) 04/15/2014  . Myocardial infarction ([place]) 04/15/2014  . Pleural cavity effusion 06/08/2013  . Change in blood platelet count 05/21/2013  . Thrombocytopenia ([place]) 05/21/2013  . Disorder of right ventricle of heart 05/20/2013  . Heart disease 05/20/2013  . H/O coronary artery bypass surgery 05/17/2013  . History of knee surgery 05/16/2013  . Arteriosclerosis of coronary artery 05/14/2013   No past medical history on file. Current Outpatient Prescriptions on File Prior to Visit  Medication Sig  . acetaminophen (TYLENOL) 500 MG tablet Take by mouth.  . Alpha-D-Galactosidase (BEANO PO) Take by mouth.  . Alpha-D-Galactosidase (BEANO TO GO) TABS Take by mouth.  Marland Kitchen aspirin 81 MG tablet Take by mouth.  Marland Kitchen CALCIUM CITRATE PO Take by mouth.  . cholecalciferol (  VITAMIN D) 1000 UNITS tablet Take by mouth.  . Coenzyme Q10 (COQ10) 100 MG CAPS Take by  mouth.  . CRESTOR 5 MG tablet TAKE 1 TABLET DAILY EXCEPT THURSDAY AND SUNDAY  . cyanocobalamin 1000 MCG tablet Take by mouth.  . ferrous sulfate 325 (65 FE) MG tablet Take by mouth.  . folic acid (FOLVITE) 1 MG tablet Take by mouth.  . furosemide (LASIX) 40 MG tablet Take by mouth.  Marland Kitchen ketotifen (ZADITOR) 0.025 % ophthalmic solution 1 drop 2 (two) times daily.  Marland Kitchen lidocaine (LIDODERM) 5 % LIDODERM, 5% (External Patch)  one to three Patch Patch Patch may apply up to 3 x 12 hours then off 12 hours for 0 days  Quantity: 30;  Refills: 5   Ordered :28-Sep-2013  [name] ;  Started 28-Sep-2013 Active  . loratadine (CLARITIN) 10 MG tablet Take by mouth.  . MELATONIN EXTRA STRENGTH PO Take 0.25 tablets by mouth at bedtime as needed.  . mometasone (NASONEX) 50 MCG/ACT nasal spray 2 sprays every evening.  . Multiple Vitamins-Minerals ([employer]S SPECTRAVITE ADULT 50+ PO) Education officer, community - Historical Medication  daily Active  . pantoprazole (PROTONIX) 40 MG tablet TAKE 1 TABLET TWO TIMES DAILY  . Polyethylene Glycol 3350 POWD Take by mouth.  . potassium chloride SA (K-DUR,KLOR-CON) 20 MEQ tablet Take by mouth.  . Probiotic Product ([employer]S SENIOR PROBIOTIC) CAPS Take by mouth.  . propranolol (INDERAL) 40 MG tablet Take by mouth.  . Psyllium Husk POWD Take by mouth.  . pyridoxine (B-6) 100 MG tablet Take by mouth.  . ranitidine (ZANTAC) 150 MG tablet Take by mouth.  . Difluprednate 0.05 % EMUL Apply to eye.  . nepafenac (ILEVRO) 0.3 % ophthalmic suspension Apply to eye.   No current facility-administered medications on file prior to visit.   Allergies  Allergen Reactions  . Alcohol     Gastric pain  . Gemfibrozil     Stomach pains, urine retention and muscle weakness.  . Gelatin Capsule Empty  [Gelatin]     Stomach pains  . Meloxicam     Drastically alters taste  . Nsaids Other (See Comments)    Tolerates aspirin stomach pains and reflux  . Oxycodone Itching    Trembling  .  Simvastatin Other (See Comments)    Feet and leg pain and general body weakness.  . Sulfa Antibiotics Nausea And Vomiting and Other (See Comments)  . Clarithromycin Rash and Nausea And Vomiting    Terrible taste in mouth and stomach ache.  . Latex Rash  . Lopressor  [Metoprolol] Palpitations  . Niacin Other (See Comments) and Rash    Flushing  . Ofloxacin Rash and Nausea Only   Past Surgical History  Procedure Laterality Date  . 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 Back surgery    . Ureteropelvic junction obstruction    . Abdominal hysterectomy    . Nose surgery  1962    deviated septum, hemorrhaged- pt had to return to hospital  . Tonsillectomy  1944  . Dilation and curettage of uterus    . Eye surgery Bilateral 2015    cataract  . Breast cyst aspiration Left    Social History   Social History  . Marital Status: Married    Spouse Name: Hassell Done  . Number of Children: 2  . Years of Education: N/A   Occupational History  . retired    Social History Main Topics  . Smoking status:  Never Smoker   . Smokeless tobacco: Never Used  . Alcohol Use: No  . Drug Use: No  . Sexual Activity: Not on file   Other Topics Concern  . Not on file   Social History Narrative   Family History  Problem Relation Age of Onset  . Heart attack Mother     3 MI's  . Diabetes Mother   . Heart disease Mother   . Diabetes Father   . Heart attack Father   . COPD Father   . Stroke Brother   . Diabetes Sister   . Heart disease Sister     2 stents      Objective:   Physical Exam  Constitutional: She is oriented to person, place, and time. She appears well-developed and well-nourished.  Cardiovascular: Normal rate and regular rhythm.   Pulmonary/Chest: Effort normal and breath sounds normal.  Neurological: She is alert and oriented to person, place, and time.  Skin: Skin is warm. Rash noted. There is erythema.  Between breasts, satellite lesions noted.      Psychiatric: She has a normal mood and affect. Her behavior is normal. Judgment and thought content normal.   BP 128/88 mmHg  Pulse 72  Temp(Src) 98.1 F (36.7 C) (Oral)  Resp 16  Wt 117 lb (53.071 kg)        Assessment & Plan:  1. Blood glucose elevated Improved.  She has done a great job with diet and exercise.   Recheck in 3 months. Continue healthy lifestyle.  - POCT glycosylated hemoglobin (Hb A1C) - nystatin cream (MYCOSTATIN); Apply 1 application topically 2 (two) times daily.  Dispense: 30 g; Refill: 0 Results for orders placed or performed in visit on 09/17/15  POCT glycosylated hemoglobin (Hb A1C)  Result Value Ref Range   Hemoglobin A1C 5.5    Est. average glucose Bld gHb Est-mCnc 111     2. Tinea corporis New problem Will treat as above. Patient instructed to call back if condition worsens or does not improve.     3. Colon cancer screening Needs colonoscopy. Cleared by cardiologist.   - Ambulatory referral to Gastroenterology  Margarita Rana, MD

## 2015-09-18 ENCOUNTER — Other Ambulatory Visit: Payer: Self-pay

## 2015-09-18 ENCOUNTER — Telehealth: Payer: Self-pay

## 2015-09-18 NOTE — Telephone Encounter (Signed)
Gastroenterology Pre-Procedure Review  Request Date: 10/03/15 Requesting Physician: Dr. Venia Minks  PATIENT REVIEW QUESTIONS: The patient responded to the following health history questions as indicated:    1. Are you having any GI issues? no 2. Do you have a personal history of Polyps? no 3. Do you have a family history of Colon Cancer or Polyps? no 4. 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 Joint replacements in the past 12 months?no 6. Major health problems in the past 3 months?no 7. Any artificial heart valves, MVP, or defibrillator?yes (quad bypass 2014)    MEDICATIONS & ALLERGIES:    Patient reports the following regarding taking any anticoagulation/antiplatelet therapy:   Plavix, Coumadin, Eliquis, Xarelto, Lovenox, Pradaxa, Brilinta, or Effient? no Aspirin? yes (ASA 81mg )  Patient confirms/reports the following medications:  Current Outpatient Prescriptions  Medication Sig Dispense Refill  . acetaminophen (TYLENOL) 500 MG tablet Take by mouth.    . Alpha-D-Galactosidase (BEANO PO) Take by mouth.    . Alpha-D-Galactosidase (BEANO TO GO) TABS Take by mouth.    Marland Kitchen aspirin 81 MG tablet Take by mouth.    Marland Kitchen CALCIUM CITRATE PO Take by mouth.    . cholecalciferol (VITAMIN D) 1000 UNITS tablet Take by mouth.    . Coenzyme Q10 (COQ10) 100 MG CAPS Take by mouth.    . CRESTOR 5 MG tablet TAKE 1 TABLET DAILY EXCEPT THURSDAY AND SUNDAY 90 tablet 2  . cyanocobalamin 1000 MCG tablet Take by mouth.    . Difluprednate 0.05 % EMUL Apply to eye.    . ferrous sulfate 325 (65 FE) MG tablet Take by mouth.    . folic acid (FOLVITE) 1 MG tablet Take by mouth.    . furosemide (LASIX) 40 MG tablet Take by mouth.    Marland Kitchen ketotifen (ZADITOR) 0.025 % ophthalmic solution 1 drop 2 (two) times daily.    Marland Kitchen lidocaine (LIDODERM) 5 % LIDODERM, 5% (External Patch)  one to three Patch Patch Patch may apply up to 3 x 12 hours then off 12 hours for 0 days  Quantity: 30;  Refills: 5   Ordered :28-Sep-2013  [name]  ;  Started 28-Sep-2013 Active    . loratadine (CLARITIN) 10 MG tablet Take by mouth.    . MELATONIN EXTRA STRENGTH PO Take 0.25 tablets by mouth at bedtime as needed.    . mometasone (NASONEX) 50 MCG/ACT nasal spray 2 sprays every evening.    . Multiple Vitamins-Minerals ([employer]S SPECTRAVITE ADULT 50+ PO) Education officer, community - Historical Medication  daily Active    . nepafenac (ILEVRO) 0.3 % ophthalmic suspension Apply to eye.    . nystatin cream (MYCOSTATIN) Apply 1 application topically 2 (two) times daily. 30 g 0  . pantoprazole (PROTONIX) 40 MG tablet TAKE 1 TABLET TWO TIMES DAILY 180 tablet 3  . Polyethyl Glycol-Propyl Glycol (SYSTANE) 0.4-0.3 % SOLN Apply to eye.    . Polyethylene Glycol 3350 POWD Take by mouth.    . potassium chloride SA (K-DUR,KLOR-CON) 20 MEQ tablet Take by mouth.    . Probiotic Product ([employer]S SENIOR PROBIOTIC) CAPS Take by mouth.    . propranolol (INDERAL) 40 MG tablet Take by mouth.    . Psyllium Husk POWD Take by mouth.    . pyridoxine (B-6) 100 MG tablet Take by mouth.    . ranitidine (ZANTAC) 150 MG tablet Take by mouth.     No current facility-administered medications for this visit.    Patient confirms/reports the following allergies:  Allergies  Allergen Reactions  .  Alcohol     Gastric pain  . Gemfibrozil     Stomach pains, urine retention and muscle weakness.  . Gelatin Capsule Empty  [Gelatin]     Stomach pains  . Meloxicam     Drastically alters taste  . Nsaids Other (See Comments)    Tolerates aspirin stomach pains and reflux  . Oxycodone Itching    Trembling  . Simvastatin Other (See Comments)    Feet and leg pain and general body weakness.  . Sulfa Antibiotics Nausea And Vomiting and Other (See Comments)  . Clarithromycin Rash and Nausea And Vomiting    Terrible taste in mouth and stomach ache.  . Latex Rash  . Lopressor  [Metoprolol] Palpitations  . Niacin Other (See Comments) and Rash    Flushing  . Ofloxacin Rash  and Nausea Only    No orders of the defined types were placed in this encounter.    AUTHORIZATION INFORMATION Primary Insurance: 1D#: Group #:  Secondary Insurance: 1D#: Group #:  SCHEDULE INFORMATION: Date: 10/03/15 Time: Location: [place]

## 2015-09-26 ENCOUNTER — Telehealth: Payer: Self-pay | Admitting: Gastroenterology

## 2015-09-26 NOTE — Telephone Encounter (Signed)
No authorization is required with [employer]. Decision ID #: [id number].

## 2015-09-29 ENCOUNTER — Encounter: Payer: Self-pay | Admitting: *Deleted

## 2015-09-29 NOTE — Anesthesia Preprocedure Evaluation (Addendum)
Anesthesia Evaluation  Patient identified by MRN, date of birth, ID band Patient awake    Reviewed: Allergy & Precautions, H&P , NPO status , Patient's Chart, lab work & pertinent test results, reviewed documented beta blocker date and time   Airway Mallampati: II  TM Distance: >3 FB Neck ROM: full    Dental no notable dental hx.    Pulmonary neg pulmonary ROS,    Pulmonary exam normal breath sounds clear to auscultation       Cardiovascular Exercise Tolerance: Good hypertension, + CAD and + Past MI   Rhythm:regular Rate:Normal  Per cardiology note, pt is active using the treadmill 6 times per week.  No chest pain.  No med changes at last cardiology visit 04/2015   Neuro/Psych negative neurological ROS  negative psych ROS   GI/Hepatic Neg liver ROS, GERD  ,  Endo/Other  negative endocrine ROS  Renal/GU Renal disease (stage 2 CRI)  negative genitourinary   Musculoskeletal   Abdominal   Peds  Hematology negative hematology ROS (+)   Anesthesia Other Findings   Reproductive/Obstetrics negative OB ROS                             Anesthesia Physical Anesthesia Plan  ASA: III  Anesthesia Plan: MAC   Post-op Pain Management:    Induction:   Airway Management Planned:   Additional Equipment:   Intra-op Plan:   Post-operative Plan:   Informed Consent: I have reviewed the patients History and Physical, chart, labs and discussed the procedure including the risks, benefits and alternatives for the proposed anesthesia with the patient or authorized representative who has indicated his/her understanding and acceptance.   Dental Advisory Given  Plan Discussed with: CRNA  Anesthesia Plan Comments:         Anesthesia Quick Evaluation

## 2015-09-30 NOTE — Discharge Instructions (Signed)

## 2015-10-03 ENCOUNTER — Ambulatory Visit: Payer: Medicare Other | Admitting: Anesthesiology

## 2015-10-03 ENCOUNTER — Ambulatory Visit
Admission: RE | Admit: 2015-10-03 | Discharge: 2015-10-03 | Disposition: A | Discharge status: Home / Self Care | Payer: Medicare Other | Source: Ambulatory Visit | Attending: Gastroenterology | Admitting: Gastroenterology

## 2015-10-03 ENCOUNTER — Encounter
Admission: RE | Disposition: A | Discharge status: Home / Self Care | Payer: Self-pay | Source: Ambulatory Visit | Attending: Gastroenterology

## 2015-10-03 ENCOUNTER — Encounter: Payer: Self-pay | Admitting: Anesthesiology

## 2015-10-03 DIAGNOSIS — Z1211 Encounter for screening for malignant neoplasm of colon: Secondary | ICD-10-CM | POA: Insufficient documentation

## 2015-10-03 DIAGNOSIS — Z833 Family history of diabetes mellitus: Secondary | ICD-10-CM | POA: Insufficient documentation

## 2015-10-03 DIAGNOSIS — Z825 Family history of asthma and other chronic lower respiratory diseases: Secondary | ICD-10-CM | POA: Insufficient documentation

## 2015-10-03 DIAGNOSIS — Z9841 Cataract extraction status, right eye: Secondary | ICD-10-CM | POA: Insufficient documentation

## 2015-10-03 DIAGNOSIS — K219 Gastro-esophageal reflux disease without esophagitis: Secondary | ICD-10-CM | POA: Insufficient documentation

## 2015-10-03 DIAGNOSIS — Z882 Allergy status to sulfonamides status: Secondary | ICD-10-CM | POA: Diagnosis not present

## 2015-10-03 DIAGNOSIS — K641 Second degree hemorrhoids: Secondary | ICD-10-CM | POA: Diagnosis not present

## 2015-10-03 DIAGNOSIS — I252 Old myocardial infarction: Secondary | ICD-10-CM | POA: Insufficient documentation

## 2015-10-03 DIAGNOSIS — Z888 Allergy status to other drugs, medicaments and biological substances status: Secondary | ICD-10-CM | POA: Diagnosis not present

## 2015-10-03 DIAGNOSIS — Z9842 Cataract extraction status, left eye: Secondary | ICD-10-CM | POA: Diagnosis not present

## 2015-10-03 DIAGNOSIS — Z951 Presence of aortocoronary bypass graft: Secondary | ICD-10-CM | POA: Diagnosis not present

## 2015-10-03 DIAGNOSIS — K5730 Diverticulosis of large intestine without perforation or abscess without bleeding: Secondary | ICD-10-CM | POA: Diagnosis not present

## 2015-10-03 DIAGNOSIS — N189 Chronic kidney disease, unspecified: Secondary | ICD-10-CM | POA: Diagnosis not present

## 2015-10-03 DIAGNOSIS — Z79899 Other long term (current) drug therapy: Secondary | ICD-10-CM | POA: Diagnosis not present

## 2015-10-03 DIAGNOSIS — I129 Hypertensive chronic kidney disease with stage 1 through stage 4 chronic kidney disease, or unspecified chronic kidney disease: Secondary | ICD-10-CM | POA: Diagnosis not present

## 2015-10-03 DIAGNOSIS — Z7982 Long term (current) use of aspirin: Secondary | ICD-10-CM | POA: Insufficient documentation

## 2015-10-03 DIAGNOSIS — Z885 Allergy status to narcotic agent status: Secondary | ICD-10-CM | POA: Insufficient documentation

## 2015-10-03 DIAGNOSIS — Z8249 Family history of ischemic heart disease and other diseases of the circulatory system: Secondary | ICD-10-CM | POA: Diagnosis not present

## 2015-10-03 DIAGNOSIS — Z90710 Acquired absence of both cervix and uterus: Secondary | ICD-10-CM | POA: Diagnosis not present

## 2015-10-03 DIAGNOSIS — Z823 Family history of stroke: Secondary | ICD-10-CM | POA: Diagnosis not present

## 2015-10-03 DIAGNOSIS — Z91040 Latex allergy status: Secondary | ICD-10-CM | POA: Diagnosis not present

## 2015-10-03 HISTORY — DX: Essential (primary) hypertension: I10

## 2015-10-03 HISTORY — DX: Unspecified osteoarthritis, unspecified site: M19.90

## 2015-10-03 HISTORY — DX: Gastro-esophageal reflux disease without esophagitis: K21.9

## 2015-10-03 HISTORY — PX: COLONOSCOPY WITH PROPOFOL: SHX5780

## 2015-10-03 HISTORY — DX: Chronic kidney disease, unspecified: N18.9

## 2015-10-03 HISTORY — DX: Motion sickness, initial encounter: T75.3XXA

## 2015-10-03 HISTORY — DX: Anemia, unspecified: D64.9

## 2015-10-03 HISTORY — DX: Other seasonal allergic rhinitis: J30.2

## 2015-10-03 SURGERY — COLONOSCOPY WITH PROPOFOL
Anesthesia: Monitor Anesthesia Care | Wound class: Contaminated

## 2015-10-03 MED ORDER — LACTATED RINGERS IV SOLN
INTRAVENOUS | Status: DC
  Administered 2015-10-03: 09:00:00 via INTRAVENOUS

## 2015-10-03 MED ORDER — LIDOCAINE HCL (CARDIAC) 20 MG/ML IV SOLN
INTRAVENOUS | Status: DC | PRN
  Administered 2015-10-03: 40 mg via INTRAVENOUS

## 2015-10-03 MED ORDER — STERILE WATER FOR IRRIGATION IR SOLN
Status: DC | PRN
  Administered 2015-10-03: 09:00:00

## 2015-10-03 MED ORDER — PROPOFOL 10 MG/ML IV BOLUS
INTRAVENOUS | Status: DC | PRN
  Administered 2015-10-03: 20 mg via INTRAVENOUS
  Administered 2015-10-03: 10 mg via INTRAVENOUS
  Administered 2015-10-03: 20 mg via INTRAVENOUS
  Administered 2015-10-03 (×2): 10 mg via INTRAVENOUS
  Administered 2015-10-03: 50 mg via INTRAVENOUS
  Administered 2015-10-03: 10 mg via INTRAVENOUS

## 2015-10-03 SURGICAL SUPPLY — 28 items

## 2015-10-03 NOTE — H&P (Signed)
[facility]  [address], [place], [place] [address] Phone: [phone] Fax : [phone]  Primary Care Physician:  Margarita Rana, MD Primary Gastroenterologist:  Dr. Allen Norris  Pre-Procedure History & Physical: HPI:  Sheila Kim is a 74 y.o. female is here for a screening colonoscopy.   Past Medical History  Diagnosis Date  . Hypertension   . Anemia     history  . Arthritis     hands, spine  . Chronic kidney disease     reports one kidney "doesn't work well"  . GERD (gastroesophageal reflux disease)   . Seasonal allergies   . Motion sickness     boats    Past Surgical History  Procedure Laterality Date  . 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 Back surgery    . Ureteropelvic junction obstruction    . Abdominal hysterectomy    . Nose surgery  1962    deviated septum, hemorrhaged- pt had to return to hospital  . Tonsillectomy  1944  . Dilation and curettage of uterus    . Eye surgery Bilateral 2015    cataract  . Breast cyst aspiration Left     Prior to Admission medications   Medication Sig Start Date End Date Taking? Authorizing Provider  Alpha-D-Galactosidase (BEANO PO) Take by mouth.   Yes Historical Provider, MD  furosemide (LASIX) 40 MG tablet Take by mouth. 10/21/14  Yes Historical Provider, MD  ketotifen (ZADITOR) 0.025 % ophthalmic solution 1 drop 2 (two) times daily.   Yes Historical Provider, MD  lidocaine (LIDODERM) 5 % LIDODERM, 5% (External Patch)  one to three Patch Patch Patch may apply up to 3 x 12 hours then off 12 hours for 0 days  Quantity: 30;  Refills: 5   Ordered :28-Sep-2013  [name] ;  Started 28-Sep-2013 Active 09/28/13  Yes Historical Provider, MD  loratadine (CLARITIN) 10 MG tablet Take by mouth. 06/07/11  Yes Historical Provider, MD  MELATONIN EXTRA STRENGTH PO Take 0.25 tablets by mouth at bedtime as needed.   Yes Historical Provider, MD  mometasone (NASONEX) 50 MCG/ACT nasal spray 2  sprays every evening.   Yes Historical Provider, MD  nystatin cream (MYCOSTATIN) Apply 1 application topically 2 (two) times daily. 09/17/15  Yes Margarita Rana, MD  pantoprazole (PROTONIX) 40 MG tablet TAKE 1 TABLET TWO TIMES DAILY 06/19/15  Yes Margarita Rana, MD  Polyethyl Glycol-Propyl Glycol (SYSTANE) 0.4-0.3 % SOLN Apply to eye.   Yes Historical Provider, MD  Probiotic Product ([employer] SENIOR PROBIOTIC) CAPS Take by mouth.   Yes Historical Provider, MD  propranolol (INDERAL) 40 MG tablet Take by mouth. 10/21/14  Yes Historical Provider, MD  ranitidine (ZANTAC) 150 MG tablet Take by mouth.   Yes Historical Provider, MD  acetaminophen (TYLENOL) 500 MG tablet Take by mouth.    Historical Provider, MD  Alpha-D-Galactosidase (BEANO TO GO) TABS Take by mouth.    Historical Provider, MD  aspirin 81 MG tablet Take by mouth.    Historical Provider, MD  CALCIUM CITRATE PO Take by mouth.    Historical Provider, MD  cholecalciferol (VITAMIN D) 1000 UNITS tablet Take by mouth.    Historical Provider, MD  Coenzyme Q10 (COQ10) 100 MG CAPS Take by mouth.    Historical Provider, MD  CRESTOR 5 MG tablet TAKE 1 TABLET DAILY EXCEPT THURSDAY AND SUNDAY 06/19/15   Margarita Rana, MD  cyanocobalamin 1000 MCG tablet Take by mouth.    Historical Provider, MD  Difluprednate 0.05 % EMUL Apply to eye.    Historical Provider, MD  ferrous sulfate 325 (65 FE) MG tablet Take by mouth. 06/07/11   Historical Provider, MD  folic acid (FOLVITE) 1 MG tablet Take by mouth.    Historical Provider, MD  Multiple Vitamins-Minerals ([employer] SPECTRAVITE ADULT 50+ PO) Education officer, community - Historical Medication  daily Active    Historical Provider, MD  nepafenac (ILEVRO) 0.3 % ophthalmic suspension Apply to eye.    Historical Provider, MD  Polyethylene Glycol 3350 POWD Take by mouth.    Historical Provider, MD  potassium chloride SA (K-DUR,KLOR-CON) 20 MEQ tablet Take by mouth. 09/16/14   Historical Provider, MD  Psyllium Husk POWD  Take by mouth.    Historical Provider, MD  pyridoxine (B-6) 100 MG tablet Take by mouth.    Historical Provider, MD    Allergies as of 09/18/2015 - Review Complete 09/18/2015  Allergen Reaction Noted  . Alcohol  02/12/2015  . Gemfibrozil  02/12/2015  . Gelatin capsule empty  [gelatin]  02/12/2015  . Meloxicam  02/12/2015  . Nsaids Other (See Comments) 03/18/2015  . Oxycodone Itching 02/12/2015  . Simvastatin Other (See Comments) 02/12/2015  . Sulfa antibiotics Nausea And Vomiting and Other (See Comments) 02/12/2015  . Clarithromycin Rash and Nausea And Vomiting 02/12/2015  . Latex Rash 02/12/2015  . Lopressor  [metoprolol] Palpitations 02/12/2015  . Niacin Other (See Comments) and Rash 02/12/2015  . Ofloxacin Rash and Nausea Only 02/12/2015    Family History  Problem Relation Age of Onset  . Heart attack Mother     3 MI's  . Diabetes Mother   . Heart disease Mother   . Diabetes Father   . Heart attack Father   . COPD Father   . Stroke Brother   . Diabetes Sister   . Heart disease Sister     2 stents    Social History   Social History  . Marital Status: Married    Spouse Name: Hassell Done  . Number of Children: 2  . Years of Education: N/A   Occupational History  . retired    Social History Main Topics  . Smoking status: Never Smoker   . Smokeless tobacco: Never Used  . Alcohol Use: No  . Drug Use: No  . Sexual Activity: Not on file   Other Topics Concern  . Not on file   Social History Narrative    Review of Systems: See HPI, otherwise negative ROS  Physical Exam: BP 145/81 mmHg  Pulse 53  Temp(Src) 97 F (36.1 C) (Temporal)  Resp 17  Ht 4\' 10"  (1.473 m)  Wt 117 lb (53.071 kg)  BMI 24.46 kg/m2  SpO2 100% General:   Alert,  pleasant and cooperative in NAD Head:  Normocephalic and atraumatic. Neck:  Supple; no masses or thyromegaly. Lungs:  Clear throughout to auscultation.    Heart:  Regular rate and rhythm. Abdomen:  Soft, nontender and  nondistended. Normal bowel sounds, without guarding, and without rebound.   Neurologic:  Alert and  oriented x4;  grossly normal neurologically.  Impression/Plan: Sheila Kim is now here to undergo a screening colonoscopy.  Risks, benefits, and alternatives regarding colonoscopy have been reviewed with the patient.  Questions have been answered.  All parties agreeable.

## 2015-10-03 NOTE — Anesthesia Procedure Notes (Signed)
Procedure Name: MAC Performed by: Yadir Zentner Pre-anesthesia Checklist: Patient identified, Emergency Drugs available, Suction available, Timeout performed and Patient being monitored Patient Re-evaluated:Patient Re-evaluated prior to inductionOxygen Delivery Method: Nasal cannula Placement Confirmation: positive ETCO2     

## 2015-10-03 NOTE — Op Note (Signed)
[facility] Patient Name: Sheila Kim Procedure Date: 10/03/2015 9:13 AM MRN: [id number] Account #: [id number] Date of Birth: 1941-02-20 Admit Type: Outpatient Age: 74 Room: [facility] OR ROOM 01 Gender: Female Note Status: Finalized Procedure:         Colonoscopy Indications:       Screening for colorectal malignant neoplasm Providers:         Lucilla Lame, MD Referring MD:      Jerrell Belfast, MD (Referring MD) Medicines:         Propofol per Anesthesia Complications:     No immediate complications. Procedure:         Pre-Anesthesia Assessment:                    - Prior to the procedure, a History and Physical was                     performed, and patient medications and allergies were                     reviewed. The patient's tolerance of previous anesthesia                     was also reviewed. The risks and benefits of the procedure                     and the sedation options and risks were discussed with the                     patient. All questions were answered, and informed consent                     was obtained. Prior Anticoagulants: The patient has taken                     no previous anticoagulant or antiplatelet agents. ASA                     Grade Assessment: II - A patient with mild systemic                     disease. After reviewing the risks and benefits, the                     patient was deemed in satisfactory condition to undergo                     the procedure.                    After obtaining informed consent, the colonoscope was                     passed under direct vision. Throughout the procedure, the                     patient's blood pressure, pulse, and oxygen saturations                     were monitored continuously. The Olympus CF-HQ190L                     Colonoscope (S#. [id number]) was introduced through the anus  and advanced to the the cecum, identified by appendiceal                      orifice and ileocecal valve. The colonoscopy was performed                     without difficulty. The patient tolerated the procedure                     well. The quality of the bowel preparation was excellent. Findings:      The perianal and digital rectal examinations were normal.      Multiple small-mouthed diverticula were found in the sigmoid colon and       in the descending colon.      Non-bleeding internal hemorrhoids were found during retroflexion. The       hemorrhoids were Grade II (internal hemorrhoids that prolapse but reduce       spontaneously). Impression:        - Diverticulosis in the sigmoid colon and in the                     descending colon.                    - Non-bleeding internal hemorrhoids.                    - No specimens collected. Recommendation:    - Return to referring physician as previously scheduled. Procedure Code(s): --- Professional ---                    [phone], Colonoscopy, flexible; diagnostic, including                     collection of specimen(s) by brushing or washing, when                     performed (separate procedure) Diagnosis Code(s): --- Professional ---                    Z12.11, Encounter for screening for malignant neoplasm of                     colon CPT copyright 2014 American Medical Association. All rights reserved. The codes documented in this report are preliminary and upon coder review may  be revised to meet current compliance requirements. Lucilla Lame, MD 10/03/2015 9:33:17 AM This report has been signed electronically. Number of Addenda: 0 Note Initiated On: 10/03/2015 9:13 AM Scope Withdrawal Time: 0 hours 6 minutes 21 seconds  Total Procedure Duration: 0 hours 9 minutes 4 seconds       [facility]

## 2015-10-03 NOTE — Transfer of Care (Signed)
Immediate Anesthesia Transfer of Care Note  Patient: Sheila Kim  Procedure(s) Performed: Procedure(s): COLONOSCOPY WITH PROPOFOL (N/A)  Patient Location: PACU  Anesthesia Type: MAC  Level of Consciousness: awake, alert  and patient cooperative  Airway and Oxygen Therapy: Patient Spontanous Breathing and Patient connected to supplemental oxygen  Post-op Assessment: Post-op Vital signs reviewed, Patient's Cardiovascular Status Stable, Respiratory Function Stable, Patent Airway and No signs of Nausea or vomiting  Post-op Vital Signs: Reviewed and stable  Complications: No apparent anesthesia complications

## 2015-10-03 NOTE — Anesthesia Postprocedure Evaluation (Signed)
Anesthesia Post Note  Patient: Sheila Kim  Procedure(s) Performed: Procedure(s) (LRB): COLONOSCOPY WITH PROPOFOL (N/A)  Patient location during evaluation: PACU Anesthesia Type: MAC Level of consciousness: awake and alert Pain management: pain level controlled Vital Signs Assessment: post-procedure vital signs reviewed and stable Respiratory status: spontaneous breathing, nonlabored ventilation, respiratory function stable and patient connected to nasal cannula oxygen Cardiovascular status: blood pressure returned to baseline and stable Postop Assessment: no signs of nausea or vomiting Anesthetic complications: no    Alisa Graff

## 2015-10-15 DIAGNOSIS — I25810 Atherosclerosis of coronary artery bypass graft(s) without angina pectoris: Secondary | ICD-10-CM | POA: Diagnosis not present

## 2015-10-15 DIAGNOSIS — E7800 Pure hypercholesterolemia, unspecified: Secondary | ICD-10-CM | POA: Diagnosis not present

## 2015-10-15 DIAGNOSIS — I213 ST elevation (STEMI) myocardial infarction of unspecified site: Secondary | ICD-10-CM | POA: Diagnosis not present

## 2015-10-15 DIAGNOSIS — I214 Non-ST elevation (NSTEMI) myocardial infarction: Secondary | ICD-10-CM | POA: Diagnosis not present

## 2015-10-15 DIAGNOSIS — I10 Essential (primary) hypertension: Secondary | ICD-10-CM | POA: Diagnosis not present

## 2015-11-12 ENCOUNTER — Encounter: Payer: Self-pay | Admitting: Family Medicine

## 2016-03-05 ENCOUNTER — Other Ambulatory Visit: Payer: Self-pay | Admitting: Family Medicine

## 2016-03-05 DIAGNOSIS — I10 Essential (primary) hypertension: Secondary | ICD-10-CM

## 2016-03-17 ENCOUNTER — Ambulatory Visit (INDEPENDENT_AMBULATORY_CARE_PROVIDER_SITE_OTHER): Payer: Medicare Other | Admitting: Family Medicine

## 2016-03-17 ENCOUNTER — Encounter: Payer: Self-pay | Admitting: Family Medicine

## 2016-03-17 VITALS — BP 138/72 | HR 76 | Temp 98.5°F | Resp 16 | Wt 120.0 lb

## 2016-03-17 DIAGNOSIS — E559 Vitamin D deficiency, unspecified: Secondary | ICD-10-CM | POA: Diagnosis not present

## 2016-03-17 DIAGNOSIS — I10 Essential (primary) hypertension: Secondary | ICD-10-CM

## 2016-03-17 DIAGNOSIS — E7800 Pure hypercholesterolemia, unspecified: Secondary | ICD-10-CM

## 2016-03-17 DIAGNOSIS — Z1231 Encounter for screening mammogram for malignant neoplasm of breast: Secondary | ICD-10-CM

## 2016-03-17 DIAGNOSIS — R739 Hyperglycemia, unspecified: Secondary | ICD-10-CM

## 2016-03-17 LAB — POCT GLYCOSYLATED HEMOGLOBIN (HGB A1C): Hemoglobin A1C: 6

## 2016-03-17 NOTE — Progress Notes (Signed)
Patient ID: Sheila Kim, female   DOB: [birth year], 75 y.o.   MRN: [id number]         Patient: Sheila Kim Female    DOB: December 17, 1940   75 y.o.   MRN: [id number] Visit Date: 03/17/2016  Today's Provider: Margarita Rana, MD   Chief Complaint  Patient presents with  . Hyperglycemia  . Hypertension  . Hyperlipidemia   Subjective:    HPI    Hyperglycemia Follow-up:   Lab Results  Component Value Date   HGBA1C 6.0 03/17/2016   HGBA1C 5.5 09/17/2015   HGBA1C 5.7* 03/19/2015   Last seen for hyperglycemia 6 months ago.  Management since then includes None. She reports fair compliance with treatment. She is not having side effects.  Current symptoms include none and have been stable. Home blood sugar records: Not being checked at home.  Episodes of hypoglycemia? no   Weight trend: stable Current diet: in general, a "healthy" diet   Current exercise: Pt exercises regularly  ------------------------------------------------------------------------   Hypertension, follow-up:  BP Readings from Last 3 Encounters:  03/17/16 138/72  10/03/15 145/81  09/17/15 128/88    She was last seen for hypertension 6 months ago.  Management since that visit includes None .She reports excellent compliance with treatment. She is not having side effects.  She is exercising. She is adherent to low salt diet.   Outside blood pressures are 110-120's/ 60/70's. She is experiencing lower extremity edema.  Patient denies chest pain, fatigue and palpitations.   Cardiovascular risk factors include advanced age (older than 63 for men, 77 for women), dyslipidemia and hypertension.  ------------------------------------------------------------------------    Lipid/Cholesterol, Follow-up:   Last seen for this 6 months ago.  Management since that visit includes None.  Last Lipid Panel:    Component Value Date/Time   CHOL 186 03/19/2015 0811   CHOL 185 05/10/2014   TRIG  192* 03/19/2015 0811   HDL 46 03/19/2015 0811   HDL 55 05/10/2014   CHOLHDL 4.0 03/19/2015 0811   LDLCALC 102* 03/19/2015 0811   LDLCALC 106 05/10/2014    She reports excellent compliance with treatment. She is not having side effects.   Wt Readings from Last 3 Encounters:  03/17/16 120 lb (54.432 kg)  10/03/15 117 lb (53.071 kg)  09/17/15 117 lb (53.071 kg)    ------------------------------------------------------------------------    Allergies  Allergen Reactions  . Alcohol     Gastric pain  . Gemfibrozil     Stomach pains, urine retention and muscle weakness.  . Gelatin Capsule Empty  [Gelatin]     Stomach pains  . Meloxicam     Drastically alters taste  . Nsaids Other (See Comments)    Tolerates aspirin stomach pains and reflux  . Oxycodone Itching    Trembling  . Simvastatin Other (See Comments)    Feet and leg pain and general body weakness.  . Sulfa Antibiotics Nausea And Vomiting and Other (See Comments)  . Clarithromycin Rash and Nausea And Vomiting    Terrible taste in mouth and stomach ache.  . Lopressor  [Metoprolol] Palpitations  . Niacin Other (See Comments) and Rash    Flushing  . Ofloxacin Rash and Nausea Only   Current Meds  Medication Sig  . acetaminophen (TYLENOL) 500 MG tablet Take by mouth.  . Alpha-D-Galactosidase (BEANO TO GO) TABS Take by mouth.  Marland Kitchen aspirin 81 MG tablet Take by mouth.  Marland Kitchen CALCIUM CITRATE PO Take by mouth.  . cholecalciferol (VITAMIN D) 1000 UNITS tablet  Take by mouth.  . Coenzyme Q10 (COQ10) 100 MG CAPS Take by mouth.  . CRESTOR 5 MG tablet TAKE 1 TABLET DAILY EXCEPT THURSDAY AND SUNDAY  . cyanocobalamin 1000 MCG tablet Take by mouth.  . Difluprednate 0.05 % EMUL Apply to eye.  . ferrous sulfate 325 (65 FE) MG tablet Take by mouth.  . folic acid (FOLVITE) 1 MG tablet Take by mouth.  . furosemide (LASIX) 40 MG tablet TAKE 1/2 TABLET BY MOUTH TWICE A DAY  . ketotifen (ZADITOR) 0.025 % ophthalmic solution 1 drop 2 (two)  times daily.  Marland Kitchen KLOR-CON M20 20 MEQ tablet TAKE 1 TABLET TWICE A DAY  . lidocaine (LIDODERM) 5 % LIDODERM, 5% (External Patch)  one to three Patch Patch Patch may apply up to 3 x 12 hours then off 12 hours for 0 days  Quantity: 30;  Refills: 5   Ordered :28-Sep-2013  [name] ;  Started 28-Sep-2013 Active  . loratadine (CLARITIN) 10 MG tablet Take by mouth.  . MELATONIN EXTRA STRENGTH PO Take 0.25 tablets by mouth at bedtime as needed.  . mometasone (NASONEX) 50 MCG/ACT nasal spray 2 sprays every evening.  . Multiple Vitamins-Minerals ([employer] SPECTRAVITE ADULT 50+ PO) Education officer, community - Historical Medication  daily Active  . nepafenac (ILEVRO) 0.3 % ophthalmic suspension Apply to eye.  . nystatin cream (MYCOSTATIN) Apply 1 application topically 2 (two) times daily.  . pantoprazole (PROTONIX) 40 MG tablet TAKE 1 TABLET TWO TIMES DAILY  . Polyethyl Glycol-Propyl Glycol (SYSTANE) 0.4-0.3 % SOLN Apply to eye.  . Polyethylene Glycol 3350 POWD Take by mouth.  . Probiotic Product ([employer]S SENIOR PROBIOTIC) CAPS Take by mouth.  . propranolol (INDERAL) 40 MG tablet TAKE 1/2 TABLET BY MOUTH TWICE A DAY  . Psyllium Husk POWD Take by mouth.  . pyridoxine (B-6) 100 MG tablet Take by mouth.  . ranitidine (ZANTAC) 150 MG tablet Take by mouth.  . [DISCONTINUED] Alpha-D-Galactosidase (BEANO PO) Take by mouth.    Review of Systems  Constitutional: Negative.   Respiratory: Negative.   Cardiovascular: Positive for leg swelling. Negative for chest pain and palpitations.  Gastrointestinal: Negative.   Musculoskeletal: Positive for myalgias (Occasional muscle cramps. ). Negative for back pain, joint swelling, arthralgias, gait problem, neck pain and neck stiffness.  Neurological: Positive for tremors. Negative for dizziness, weakness, light-headedness, numbness and headaches.    Social History  Substance Use Topics  . Smoking status: Never Smoker   . Smokeless tobacco: Never Used  .  Alcohol Use: No   Objective:   BP 138/72 mmHg  Pulse 76  Temp(Src) 98.5 F (36.9 C) (Oral)  Resp 16  Wt 120 lb (54.432 kg)  Physical Exam  Constitutional: She is oriented to person, place, and time. She appears well-developed and well-nourished.  Cardiovascular: Normal rate, regular rhythm and normal heart sounds.   Pulmonary/Chest: Effort normal and breath sounds normal.  Neurological: She is alert and oriented to person, place, and time.  Skin: Skin is warm and dry.  Psychiatric: She has a normal mood and affect. Her behavior is normal. Judgment and thought content normal.        Assessment & Plan:     1. Essential hypertension Stable; continue current medications.  - CBC with Differential/Platelet - TSH  2. Blood glucose elevated A1C slightly elevated at 6.0%; no chance in medications.  Will work on lifestyle changes.  Recheck in six months with Tawanna Sat.  - POCT glycosylated hemoglobin (Hb A1C) - Comprehensive metabolic  panel  3. Hypercholesteremia Stable; - Lipid panel  4. Avitaminosis D Stable will check labs.  - CBC with Differential/Platelet - VITAMIN D 25 Hydroxy (Vit-D Deficiency, Fractures)  5. Encounter for screening mammogram for breast cancer Ordered Mammogram   - MM Digital Screening  Patient was seen and examined by Jerrell Belfast, MD, and note scribed by Ashley Royalty, CMA.  I have reviewed the document for accuracy and completeness and I agree with above. - Jerrell Belfast, MD       Margarita Rana, MD  [facility]

## 2016-03-23 DIAGNOSIS — I10 Essential (primary) hypertension: Secondary | ICD-10-CM | POA: Diagnosis not present

## 2016-03-23 DIAGNOSIS — R739 Hyperglycemia, unspecified: Secondary | ICD-10-CM | POA: Diagnosis not present

## 2016-03-23 DIAGNOSIS — E7800 Pure hypercholesterolemia, unspecified: Secondary | ICD-10-CM | POA: Diagnosis not present

## 2016-03-23 DIAGNOSIS — E559 Vitamin D deficiency, unspecified: Secondary | ICD-10-CM | POA: Diagnosis not present

## 2016-03-24 LAB — COMPREHENSIVE METABOLIC PANEL
ALT: 17 IU/L (ref 0–32)
AST: 19 IU/L (ref 0–40)
Albumin/Globulin Ratio: 1.7 (ref 1.2–2.2)
Albumin: 3.9 g/dL (ref 3.5–4.8)
Alkaline Phosphatase: 51 IU/L (ref 39–117)
BUN/Creatinine Ratio: 21 (ref 12–28)
BUN: 23 mg/dL (ref 8–27)
Bilirubin Total: 0.7 mg/dL (ref 0.0–1.2)
CO2: 26 mmol/L (ref 18–29)
Calcium: 9.7 mg/dL (ref 8.7–10.3)
Chloride: 99 mmol/L (ref 96–106)
Creatinine, Ser: 1.11 mg/dL — ABNORMAL HIGH (ref 0.57–1.00)
GFR calc Af Amer: 55 mL/min/{1.73_m2} — ABNORMAL LOW (ref >59)
GFR calc non Af Amer: 48 mL/min/{1.73_m2} — ABNORMAL LOW (ref >59)
Globulin, Total: 2.3 g/dL (ref 1.5–4.5)
Glucose: 100 mg/dL — ABNORMAL HIGH (ref 65–99)
Potassium: 4.7 mmol/L (ref 3.5–5.2)
Sodium: 140 mmol/L (ref 134–144)
Total Protein: 6.2 g/dL (ref 6.0–8.5)

## 2016-03-24 LAB — CBC WITH DIFFERENTIAL/PLATELET
BASOS: 1 %
Basophils Absolute: 0.1 10*3/uL (ref 0.0–0.2)
EOS (ABSOLUTE): 0.4 10*3/uL (ref 0.0–0.4)
EOS: 6 %
HEMATOCRIT: 41.8 % (ref 34.0–46.6)
Hemoglobin: 14.4 g/dL (ref 11.1–15.9)
IMMATURE GRANS (ABS): 0 10*3/uL (ref 0.0–0.1)
IMMATURE GRANULOCYTES: 0 %
Lymphocytes Absolute: 1.8 10*3/uL (ref 0.7–3.1)
Lymphs: 30 %
MCH: 33 pg (ref 26.6–33.0)
MCHC: 34.4 g/dL (ref 31.5–35.7)
MCV: 96 fL (ref 79–97)
MONOS ABS: 0.5 10*3/uL (ref 0.1–0.9)
Monocytes: 9 %
NEUTROS PCT: 54 %
Neutrophils Absolute: 3.1 10*3/uL (ref 1.4–7.0)
Platelets: 290 10*3/uL (ref 150–379)
RBC: 4.36 x10E6/uL (ref 3.77–5.28)
RDW: 13.1 % (ref 12.3–15.4)
WBC: 5.9 10*3/uL (ref 3.4–10.8)

## 2016-03-24 LAB — LIPID PANEL
Chol/HDL Ratio: 4.4 ratio units (ref 0.0–4.4)
Cholesterol, Total: 223 mg/dL — ABNORMAL HIGH (ref 100–199)
HDL: 51 mg/dL (ref >39)
LDL Calculated: 134 mg/dL — ABNORMAL HIGH (ref 0–99)
Triglycerides: 191 mg/dL — ABNORMAL HIGH (ref 0–149)
VLDL Cholesterol Cal: 38 mg/dL (ref 5–40)

## 2016-03-24 LAB — TSH: TSH: 0.865 u[IU]/mL (ref 0.450–4.500)

## 2016-03-24 LAB — VITAMIN D 25 HYDROXY (VIT D DEFICIENCY, FRACTURES): Vit D, 25-Hydroxy: 55.6 ng/mL (ref 30.0–100.0)

## 2016-03-25 ENCOUNTER — Telehealth: Payer: Self-pay

## 2016-03-25 NOTE — Telephone Encounter (Signed)
Pt advised as directed below.  She is going to work on lifestyle changes first.  She had a question about her kidney function.  She was worried that it was a little off.   Thanks,   -Mickel Baas

## 2016-03-25 NOTE — Telephone Encounter (Signed)
-----   Message from Margarita Rana, MD sent at 03/24/2016  1:11 PM EDT ----- Labs stable except cholesterol not as good as previous.  Suspect related to stress of last 6 months. Can continue current medications, adjust lifestyle and recheck in 3 to 6 months or increase medication now. Thanks.

## 2016-03-25 NOTE — Telephone Encounter (Signed)
Just a little off.   Stay well hydrated and recheck in 3 months.   Not concerning. See if frequently as the kidneys get older. Thanks.

## 2016-03-25 NOTE — Telephone Encounter (Signed)
Informed pt as below. Elexia Friedt Drozdowski, CMA  

## 2016-03-30 ENCOUNTER — Ambulatory Visit
Admission: RE | Admit: 2016-03-30 | Discharge: 2016-03-30 | Disposition: A | Discharge status: Home / Self Care | Payer: Medicare Other | Source: Ambulatory Visit | Attending: Family Medicine | Admitting: Family Medicine

## 2016-03-30 DIAGNOSIS — Z1231 Encounter for screening mammogram for malignant neoplasm of breast: Secondary | ICD-10-CM | POA: Insufficient documentation

## 2016-04-08 DIAGNOSIS — H35372 Puckering of macula, left eye: Secondary | ICD-10-CM | POA: Diagnosis not present

## 2016-06-07 ENCOUNTER — Other Ambulatory Visit: Payer: Self-pay | Admitting: Family Medicine

## 2016-06-07 DIAGNOSIS — E7800 Pure hypercholesterolemia, unspecified: Secondary | ICD-10-CM

## 2016-06-07 NOTE — Telephone Encounter (Signed)
Jenni's pt.   Thanks,   -Annah Jasko  

## 2016-06-08 ENCOUNTER — Other Ambulatory Visit: Payer: Self-pay | Admitting: Family Medicine

## 2016-06-08 DIAGNOSIS — K449 Diaphragmatic hernia without obstruction or gangrene: Principal | ICD-10-CM

## 2016-06-08 DIAGNOSIS — K219 Gastro-esophageal reflux disease without esophagitis: Secondary | ICD-10-CM

## 2016-06-09 NOTE — Telephone Encounter (Signed)
Jenni's pt.   Thanks,   -Laura  

## 2016-07-06 ENCOUNTER — Encounter: Payer: Self-pay | Admitting: Family Medicine

## 2016-07-06 ENCOUNTER — Ambulatory Visit (INDEPENDENT_AMBULATORY_CARE_PROVIDER_SITE_OTHER): Payer: Medicare Other | Admitting: Family Medicine

## 2016-07-06 VITALS — BP 110/78 | HR 68 | Temp 97.9°F | Wt 119.2 lb

## 2016-07-06 DIAGNOSIS — A09 Infectious gastroenteritis and colitis, unspecified: Secondary | ICD-10-CM

## 2016-07-06 DIAGNOSIS — R197 Diarrhea, unspecified: Secondary | ICD-10-CM

## 2016-07-06 MED ORDER — DIPHENOXYLATE-ATROPINE 2.5-0.025 MG PO TABS
1.0000 | ORAL_TABLET | Freq: Four times a day (QID) | ORAL | 0 refills | Status: DC | PRN
Start: 2016-07-06 — End: 2016-09-15

## 2016-07-06 NOTE — Patient Instructions (Signed)
Food Choices to Help Relieve Diarrhea, Adult When you have diarrhea, the foods you eat and your eating habits are very important. Choosing the right foods and drinks can help relieve diarrhea. Also, because diarrhea can last up to 7 days, you need to replace lost fluids and electrolytes (such as sodium, potassium, and chloride) in order to help prevent dehydration.  WHAT GENERAL GUIDELINES DO I NEED TO FOLLOW?  Slowly drink 1 cup (8 oz) of fluid for each episode of diarrhea. If you are getting enough fluid, your urine will be clear or pale yellow.  Eat starchy foods. Some good choices include white rice, white toast, pasta, low-fiber cereal, baked potatoes (without the skin), saltine crackers, and bagels.  Avoid large servings of any cooked vegetables.  Limit fruit to two servings per day. A serving is  cup or 1 small piece.  Choose foods with less than 2 g of fiber per serving.  Limit fats to less than 8 tsp (38 g) per day.  Avoid fried foods.  Eat foods that have probiotics in them. Probiotics can be found in certain dairy products.  Avoid foods and beverages that may increase the speed at which food moves through the stomach and intestines (gastrointestinal tract). Things to avoid include:  High-fiber foods, such as dried fruit, raw fruits and vegetables, nuts, seeds, and whole grain foods.  Spicy foods and high-fat foods.  Foods and beverages sweetened with high-fructose corn syrup, honey, or sugar alcohols such as xylitol, sorbitol, and mannitol. WHAT FOODS ARE RECOMMENDED? Grains White rice. White, French, or pita breads (fresh or toasted), including plain rolls, buns, or bagels. White pasta. Saltine, soda, or graham crackers. Pretzels. Low-fiber cereal. Cooked cereals made with water (such as cornmeal, farina, or cream cereals). Plain muffins. Matzo. Melba toast. Zwieback.  Vegetables Potatoes (without the skin). Strained tomato and vegetable juices. Most well-cooked and canned  vegetables without seeds. Tender lettuce. Fruits Cooked or canned applesauce, apricots, cherries, fruit cocktail, grapefruit, peaches, pears, or plums. Fresh bananas, apples without skin, cherries, grapes, cantaloupe, grapefruit, peaches, oranges, or plums.  Meat and Other Protein Products Baked or boiled chicken. Eggs. Tofu. Fish. Seafood. Smooth peanut butter. Ground or well-cooked tender beef, ham, veal, lamb, pork, or poultry.  Dairy Plain yogurt, kefir, and unsweetened liquid yogurt. Lactose-free milk, buttermilk, or soy milk. Plain hard cheese. Beverages Sport drinks. Clear broths. Diluted fruit juices (except prune). Regular, caffeine-free sodas such as ginger ale. Water. Decaffeinated teas. Oral rehydration solutions. Sugar-free beverages not sweetened with sugar alcohols. Other Bouillon, broth, or soups made from recommended foods.  The items listed above may not be a complete list of recommended foods or beverages. Contact your dietitian for more options. WHAT FOODS ARE NOT RECOMMENDED? Grains Whole grain, whole wheat, bran, or rye breads, rolls, pastas, crackers, and cereals. Wild or brown rice. Cereals that contain more than 2 g of fiber per serving. Corn tortillas or taco shells. Cooked or dry oatmeal. Granola. Popcorn. Vegetables Raw vegetables. Cabbage, broccoli, Brussels sprouts, artichokes, baked beans, beet greens, corn, kale, legumes, peas, sweet potatoes, and yams. Potato skins. Cooked spinach and cabbage. Fruits Dried fruit, including raisins and dates. Raw fruits. Stewed or dried prunes. Fresh apples with skin, apricots, mangoes, pears, raspberries, and strawberries.  Meat and Other Protein Products Chunky peanut butter. Nuts and seeds. Beans and lentils. Bacon.  Dairy High-fat cheeses. Milk, chocolate milk, and beverages made with milk, such as milk shakes. Cream. Ice cream. Sweets and Desserts Sweet rolls, doughnuts, and sweet breads.   Pancakes and waffles. Fats and  Oils Butter. Cream sauces. Margarine. Salad oils. Plain salad dressings. Olives. Avocados.  Beverages Caffeinated beverages (such as coffee, tea, soda, or energy drinks). Alcoholic beverages. Fruit juices with pulp. Prune juice. Soft drinks sweetened with high-fructose corn syrup or sugar alcohols. Other Coconut. Hot sauce. Chili powder. Mayonnaise. Gravy. Cream-based or milk-based soups.  The items listed above may not be a complete list of foods and beverages to avoid. Contact your dietitian for more information. WHAT SHOULD I DO IF I BECOME DEHYDRATED? Diarrhea can sometimes lead to dehydration. Signs of dehydration include dark urine and dry mouth and skin. If you think you are dehydrated, you should rehydrate with an oral rehydration solution. These solutions can be purchased at pharmacies, retail stores, or online.  Drink -1 cup (120-240 mL) of oral rehydration solution each time you have an episode of diarrhea. If drinking this amount makes your diarrhea worse, try drinking smaller amounts more often. For example, drink 1-3 tsp (5-15 mL) every 5-10 minutes.  A general rule for staying hydrated is to drink 1-2 L of fluid per day. Talk to your health care provider about the specific amount you should be drinking each day. Drink enough fluids to keep your urine clear or pale yellow.   This information is not intended to replace advice given to you by your health care provider. Make sure you discuss any questions you have with your health care provider.   Document Released: 12/18/2003 Document Revised: 10/18/2014 Document Reviewed: 08/20/2013 Elsevier Interactive Patient Education 2016 [employer] Diarrhea Diarrhea is frequent loose and watery bowel movements. It can cause you to feel weak and dehydrated. Dehydration can cause you to become tired and thirsty, have a dry mouth, and have decreased urination that often is dark yellow. Diarrhea is a sign of another problem, most often an  infection that will not last long. In most cases, diarrhea typically lasts 2-3 days. However, it can last longer if it is a sign of something more serious. It is important to treat your diarrhea as directed by your caregiver to lessen or prevent future episodes of diarrhea. CAUSES  Some common causes include:  Gastrointestinal infections caused by viruses, bacteria, or parasites.  Food poisoning or food allergies.  Certain medicines, such as antibiotics, chemotherapy, and laxatives.  Artificial sweeteners and fructose.  Digestive disorders. HOME CARE INSTRUCTIONS  Ensure adequate fluid intake (hydration): Have 1 cup (8 oz) of fluid for each diarrhea episode. Avoid fluids that contain simple sugars or sports drinks, fruit juices, whole milk products, and sodas. Your urine should be clear or pale yellow if you are drinking enough fluids. Hydrate with an oral rehydration solution that you can purchase at pharmacies, retail stores, and online. You can prepare an oral rehydration solution at home by mixing the following ingredients together:   - tsp table salt.   tsp baking soda.   tsp salt substitute containing potassium chloride.  1  tablespoons sugar.  1 L (34 oz) of water.  Certain foods and beverages may increase the speed at which food moves through the gastrointestinal (GI) tract. These foods and beverages should be avoided and include:  Caffeinated and alcoholic beverages.  High-fiber foods, such as raw fruits and vegetables, nuts, seeds, and whole grain breads and cereals.  Foods and beverages sweetened with sugar alcohols, such as xylitol, sorbitol, and mannitol.  Some foods may be well tolerated and may help thicken stool including:  Starchy foods, such as rice, toast, pasta,   low-sugar cereal, oatmeal, grits, baked potatoes, crackers, and bagels.  Bananas.  Applesauce.  Add probiotic-rich foods to help increase healthy bacteria in the GI tract, such as yogurt and  fermented milk products.  Wash your hands well after each diarrhea episode.  Only take over-the-counter or prescription medicines as directed by your caregiver.  Take a warm bath to relieve any burning or pain from frequent diarrhea episodes. SEEK IMMEDIATE MEDICAL CARE IF:   You are unable to keep fluids down.  You have persistent vomiting.  You have blood in your stool, or your stools are black and tarry.  You do not urinate in 6-8 hours, or there is only a small amount of very dark urine.  You have abdominal pain that increases or localizes.  You have weakness, dizziness, confusion, or light-headedness.  You have a severe headache.  Your diarrhea gets worse or does not get better.  You have a fever or persistent symptoms for more than 2-3 days.  You have a fever and your symptoms suddenly get worse. MAKE SURE YOU:   Understand these instructions.  Will watch your condition.  Will get help right away if you are not doing well or get worse.   This information is not intended to replace advice given to you by your health care provider. Make sure you discuss any questions you have with your health care provider.   Document Released: 09/17/2002 Document Revised: 10/18/2014 Document Reviewed: 06/04/2012 Elsevier Interactive Patient Education 2016 Elsevier Inc.  

## 2016-07-06 NOTE — Progress Notes (Signed)
Patient: Sheila Kim Female    DOB: [birth year]   75 y.o.   MRN: [id number] Visit Date: 07/06/2016  Today's Provider: Vernie Murders, PA   Chief Complaint  Patient presents with  . Diarrhea   Subjective:    Diarrhea   This is a new problem. The current episode started in the past 7 days. The problem occurs 5 to 10 times per day. The problem has been gradually worsening. The stool consistency is described as bloody, mucous and watery. The patient states that diarrhea awakens her from sleep. Associated symptoms include abdominal pain (cramping). Associated symptoms comments: Abdominal cramps . Exacerbated by: eating. She has tried anti-motility drug for the symptoms. The treatment provided no relief.   Past Medical History:  Diagnosis Date  . Anemia    history  . Arthritis    hands, spine  . Chronic kidney disease    reports one kidney "doesn't work well"  . GERD (gastroesophageal reflux disease)   . Hypertension   . Motion sickness    boats  . Seasonal allergies    Past Surgical History:  Procedure Laterality Date  . ABDOMINAL HYSTERECTOMY    . BACK SURGERY    . BREAST CYST ASPIRATION Left   . COLONOSCOPY WITH PROPOFOL N/A 10/03/2015   Procedure: COLONOSCOPY WITH PROPOFOL;  Surgeon: Lucilla Lame, MD;  Location: [facility];  Service: Endoscopy;  Laterality: N/A;  . 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 DILATION AND CURETTAGE OF UTERUS    . EYE SURGERY Bilateral 2015   cataract  . NOSE SURGERY  1962   deviated septum, hemorrhaged- pt had to return to hospital  . [place]  . ureteropelvic junction obstruction     Family History  Problem Relation Age of Onset  . Heart attack Mother     3 MI's  . Diabetes Mother   . Heart disease Mother   . Diabetes Father   . Heart attack Father   . COPD Father   . Stroke Brother   . Diabetes Sister   . Heart disease Sister     2 stents  . Breast cancer Neg Hx    Allergies    Allergen Reactions  . Alcohol     Gastric pain  . Gelatin Capsule Empty  [Gelatin]     Stomach pains  . Gemfibrozil     Stomach pains, urine retention and muscle weakness.  . Meloxicam     Drastically alters taste  . Nsaids Other (See Comments)    Tolerates aspirin stomach pains and reflux  . Oxycodone Itching    Trembling  . Simvastatin Other (See Comments)    Feet and leg pain and general body weakness.  . Sulfa Antibiotics Nausea And Vomiting and Other (See Comments)  . Clarithromycin Rash and Nausea And Vomiting    Terrible taste in mouth and stomach ache.  . Lopressor  [Metoprolol] Palpitations  . Niacin Other (See Comments) and Rash    Flushing  . Ofloxacin Rash and Nausea Only  .   Previous Medications   ACETAMINOPHEN (TYLENOL) 500 MG TABLET    Take by mouth.   ALPHA-D-GALACTOSIDASE (BEANO TO GO) TABS    Take by mouth.   ASPIRIN 81 MG TABLET    Take by mouth.   CALCIUM CITRATE PO    Take by mouth.   CHOLECALCIFEROL (VITAMIN D) 1000 UNITS TABLET    Take by mouth.   COENZYME Q10 (COQ10) 100 MG  CAPS    Take by mouth.   CYANOCOBALAMIN 1000 MCG TABLET    Take by mouth.   DIFLUPREDNATE 0.05 % EMUL    Apply to eye.   FERROUS SULFATE 325 (65 FE) MG TABLET    Take by mouth.   FOLIC ACID (FOLVITE) 1 MG TABLET    Take by mouth.   FUROSEMIDE (LASIX) 40 MG TABLET    TAKE 1/2 TABLET BY MOUTH TWICE A DAY   KETOTIFEN (ZADITOR) 0.025 % OPHTHALMIC SOLUTION    1 drop 2 (two) times daily.   KLOR-CON M20 20 MEQ TABLET    TAKE 1 TABLET TWICE A DAY   LIDOCAINE (LIDODERM) 5 %    LIDODERM, 5% (External Patch)  one to three Patch Patch Patch may apply up to 3 x 12 hours then off 12 hours for 0 days  Quantity: 30;  Refills: 5   Ordered :28-Sep-2013  [name] ;  Started 28-Sep-2013 Active   LORATADINE (CLARITIN) 10 MG TABLET    Take by mouth.   MELATONIN EXTRA STRENGTH PO    Take 0.25 tablets by mouth at bedtime as needed.   MOMETASONE (NASONEX) 50 MCG/ACT NASAL SPRAY    2 sprays every  evening.   MULTIPLE VITAMINS-MINERALS ([facility] SPECTRAVITE ADULT 50+ PO)    Education officer, community - Historical Medication  daily Active   NEPAFENAC (ILEVRO) 0.3 % OPHTHALMIC SUSPENSION    Apply to eye.   NYSTATIN CREAM (MYCOSTATIN)    Apply 1 application topically 2 (two) times daily.   PANTOPRAZOLE (PROTONIX) 40 MG TABLET    TAKE 1 TABLET TWO TIMES DAILY   POLYETHYL GLYCOL-PROPYL GLYCOL (SYSTANE) 0.4-0.3 % SOLN    Apply to eye.   POLYETHYLENE GLYCOL 3350 POWD    Take by mouth.   PROBIOTIC PRODUCT ([facility] SENIOR PROBIOTIC) CAPS    Take by mouth.   PROPRANOLOL (INDERAL) 40 MG TABLET    TAKE 1/2 TABLET BY MOUTH TWICE A DAY   PSYLLIUM HUSK POWD    Take by mouth.   PYRIDOXINE (B-6) 100 MG TABLET    Take by mouth.   RANITIDINE (ZANTAC) 150 MG TABLET    Take by mouth.   ROSUVASTATIN (CRESTOR) 5 MG TABLET    TAKE 1 TABLET DAILY EXCEPT THURSDAY AND SUNDAY    Review of Systems  Constitutional: Negative.   Respiratory: Negative.   Cardiovascular: Negative.   Gastrointestinal: Positive for abdominal pain (cramping) and diarrhea.    Social History  Substance Use Topics  . Smoking status: Never Smoker  . Smokeless tobacco: Never Used  . Alcohol use No   Objective:   BP 110/78 (BP Location: Right Arm, Patient Position: Sitting, Cuff Size: Normal)   Pulse 68   Temp 97.9 F (36.6 C) (Oral)   Wt 119 lb 3.2 oz (54.1 kg)   BMI 24.91 kg/m    Wt Readings from Last 3 Encounters:  07/06/16 119 lb 3.2 oz (54.1 kg)  03/17/16 120 lb (54.4 kg)  10/03/15 117 lb (53.1 kg)    Physical Exam  Constitutional: She is oriented to person, place, and time. She appears well-developed and well-nourished. No distress.  HENT:  Head: Normocephalic and atraumatic.  Right Ear: Hearing normal.  Left Ear: Hearing normal.  Nose: Nose normal.  Eyes: Conjunctivae and lids are normal. Right eye exhibits no discharge. Left eye exhibits no discharge. No scleral icterus.  Cardiovascular: Normal rate and  regular rhythm.   Pulmonary/Chest: Effort normal and breath sounds normal. No  respiratory distress.  Abdominal: She exhibits no mass. There is tenderness. There is no rebound and no guarding.  Slightly hyperactive bowel sounds. No masses or guarding. Some left upper quadrant and periumbilical tenderness.  Musculoskeletal: Normal range of motion.  Neurological: She is alert and oriented to person, place, and time.  Skin: Skin is intact. No lesion and no rash noted.  Psychiatric: She has a normal mood and affect. Her speech is normal and behavior is normal. Thought content normal.   Assessment & Plan:     1. Diarrhea of presumed infectious origin Onset over the past 7 days without any help from probiotic supplement. Has had more problems with constipation in the past. Colonoscopy by Dr. Allen Norris (gastroenterologist) showed some diverticula and internal hemorrhoids. Has had some colitis issues and worried about infection process now. No fever. Will get CBC, CMP and stool culture to rule out infectious diarrhea with dehydration. Recheck pending reports. - CBC with Differential/Platelet - Comprehensive metabolic panel - Stool Culture

## 2016-07-07 LAB — COMPREHENSIVE METABOLIC PANEL
A/G RATIO: 1.7 (ref 1.2–2.2)
ALBUMIN: 4.6 g/dL (ref 3.5–4.8)
ALT: 17 IU/L (ref 0–32)
AST: 26 IU/L (ref 0–40)
Alkaline Phosphatase: 60 IU/L (ref 39–117)
BUN / CREAT RATIO: 17 (ref 12–28)
BUN: 22 mg/dL (ref 8–27)
Bilirubin Total: 1.3 mg/dL — ABNORMAL HIGH (ref 0.0–1.2)
CO2: 26 mmol/L (ref 18–29)
CREATININE: 1.3 mg/dL — AB (ref 0.57–1.00)
Calcium: 10 mg/dL (ref 8.7–10.3)
Chloride: 92 mmol/L — ABNORMAL LOW (ref 96–106)
GFR calc Af Amer: 46 mL/min/{1.73_m2} — ABNORMAL LOW (ref >59)
GFR calc non Af Amer: 40 mL/min/{1.73_m2} — ABNORMAL LOW (ref >59)
GLOBULIN, TOTAL: 2.7 g/dL (ref 1.5–4.5)
Glucose: 113 mg/dL — ABNORMAL HIGH (ref 65–99)
POTASSIUM: 3.6 mmol/L (ref 3.5–5.2)
SODIUM: 135 mmol/L (ref 134–144)
Total Protein: 7.3 g/dL (ref 6.0–8.5)

## 2016-07-07 LAB — CBC WITH DIFFERENTIAL/PLATELET
BASOS: 0 %
Basophils Absolute: 0 10*3/uL (ref 0.0–0.2)
EOS (ABSOLUTE): 0.4 10*3/uL (ref 0.0–0.4)
Eos: 4 %
HEMATOCRIT: 45.5 % (ref 34.0–46.6)
HEMOGLOBIN: 16.1 g/dL — AB (ref 11.1–15.9)
Immature Grans (Abs): 0 10*3/uL (ref 0.0–0.1)
Immature Granulocytes: 0 %
LYMPHS ABS: 2.5 10*3/uL (ref 0.7–3.1)
Lymphs: 23 %
MCH: 33 pg (ref 26.6–33.0)
MCHC: 35.4 g/dL (ref 31.5–35.7)
MCV: 93 fL (ref 79–97)
MONOS ABS: 1.5 10*3/uL — AB (ref 0.1–0.9)
Monocytes: 14 %
NEUTROS ABS: 6.5 10*3/uL (ref 1.4–7.0)
Neutrophils: 59 %
Platelets: 363 10*3/uL (ref 150–379)
RBC: 4.88 x10E6/uL (ref 3.77–5.28)
RDW: 12.7 % (ref 12.3–15.4)
WBC: 11 10*3/uL — ABNORMAL HIGH (ref 3.4–10.8)

## 2016-07-08 ENCOUNTER — Telehealth: Payer: Self-pay

## 2016-07-08 MED ORDER — DOXYCYCLINE HYCLATE 100 MG PO TABS: 100.0000 mg | ORAL_TABLET | Freq: Two times a day (BID) | ORAL | 0 refills | Status: DC

## 2016-07-08 NOTE — Telephone Encounter (Signed)
Advised patient of results. Tried to send in Cipro, and it gave me a red flag due to her allergy list. Ok to send in Cipro? Please advise. Thanks!

## 2016-07-08 NOTE — Telephone Encounter (Signed)
Sent in Doxy as below. Patient was advised.

## 2016-07-08 NOTE — Telephone Encounter (Signed)
-----   Message from Margo Common, [place] sent at 07/08/2016  8:05 AM EDT ----- Some increase in creatinine and GFR down - indicates stress on renal function. Needs extra fluid intake. WBC count elevated and recommend Cipro 500 mg BID #14. Awaiting stool culture report.

## 2016-07-08 NOTE — Telephone Encounter (Signed)
Has had reaction to Ofloxacin (same quinolone class as ciprofloxacin). Switch to Doxycycline 100 mg BID #20.

## 2016-07-12 ENCOUNTER — Telehealth: Payer: Self-pay

## 2016-07-12 LAB — STOOL CULTURE: E coli, Shiga toxin Assay: NEGATIVE

## 2016-07-12 NOTE — Telephone Encounter (Signed)
Patient advised.

## 2016-07-12 NOTE — Telephone Encounter (Signed)
-----   Message from Margo Common, [place] sent at 07/12/2016  4:39 PM EDT ----- All stool tests are negative. Recheck CBC in 1 week for resolution of elevated WBC count after finishing the antibiotic.

## 2016-07-26 ENCOUNTER — Ambulatory Visit (INDEPENDENT_AMBULATORY_CARE_PROVIDER_SITE_OTHER): Payer: Medicare Other | Admitting: Family Medicine

## 2016-07-26 ENCOUNTER — Encounter: Payer: Self-pay | Admitting: Family Medicine

## 2016-07-26 VITALS — BP 138/78 | HR 63 | Temp 97.6°F | Resp 14 | Wt 117.8 lb

## 2016-07-26 DIAGNOSIS — R197 Diarrhea, unspecified: Secondary | ICD-10-CM | POA: Diagnosis not present

## 2016-07-26 DIAGNOSIS — D72829 Elevated white blood cell count, unspecified: Secondary | ICD-10-CM | POA: Diagnosis not present

## 2016-07-26 DIAGNOSIS — K5730 Diverticulosis of large intestine without perforation or abscess without bleeding: Secondary | ICD-10-CM | POA: Diagnosis not present

## 2016-07-26 NOTE — Progress Notes (Signed)
Patient: Sheila Kim Female    DOB: 08/12/1941   75 y.o.   MRN: [id number] Visit Date: 07/26/2016  Today's Provider: Vernie Murders, PA   Chief Complaint  Patient presents with  . Follow-up   Subjective:    HPI Patient is here today to follow up from labs done on 07/06/2016. Labs showed patient's WBC was elevated. Patient started Doxycycline 100 mg BID on 07/08/2016. She reports excellent compliance with treatment plan. Patient reports diarrhea has returned since completing medication.   Patient Active Problem List   Diagnosis Date Noted  . Special screening for malignant neoplasms, colon   . Tinea corporis 09/17/2015  . Colon cancer screening 09/17/2015  . Allergic rhinitis 02/12/2015  . Atherosclerosis of coronary artery 02/12/2015  . Chronic kidney disease (CKD), stage II (mild) 02/12/2015  . Colitis 02/12/2015  . DD (diverticular disease) 02/12/2015  . Gastroesophageal reflux disease with hiatal hernia 02/12/2015  . Personal history of methicillin resistant Staphylococcus aureus 02/12/2015  . H/O arthrodesis 02/12/2015  . Hypercholesteremia 02/12/2015  . Blood glucose elevated 02/12/2015  . BP (high blood pressure) 02/12/2015  . Heart & renal disease, hypertensive, 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[place]) 02/12/2015  . Arthritis, degenerative 02/12/2015  . OP (osteoporosis) 02/12/2015  . Impaired renal function 02/12/2015  . Avitaminosis D 02/12/2015  . Heart attack 04/15/2014  . Myocardial infarction 04/15/2014  . Pleural cavity effusion 06/08/2013  . Change in blood platelet count 05/21/2013  . Thrombocytopenia ([place]) 05/21/2013  . Disorder of right ventricle of heart 05/20/2013  . Heart disease 05/20/2013  . H/O coronary artery bypass surgery 05/17/2013  . History of knee surgery 05/16/2013  . Arteriosclerosis of coronary artery 05/14/2013   Past Surgical History:  Procedure Laterality Date  . ABDOMINAL HYSTERECTOMY    . BACK SURGERY    . BREAST CYST ASPIRATION  Left   . COLONOSCOPY WITH PROPOFOL N/A 10/03/2015   Procedure: COLONOSCOPY WITH PROPOFOL;  Surgeon: Lucilla Lame, MD;  Location: [facility];  Service: Endoscopy;  Laterality: N/A;  . 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 DILATION AND CURETTAGE OF UTERUS    . EYE SURGERY Bilateral 2015   cataract  . NOSE SURGERY  1962   deviated septum, hemorrhaged- pt had to return to hospital  . [place]  . ureteropelvic junction obstruction     Family History  Problem Relation Age of Onset  . Heart attack Mother     3 MI's  . Diabetes Mother   . Heart disease Mother   . Diabetes Father   . Heart attack Father   . COPD Father   . Stroke Brother   . Diabetes Sister   . Heart disease Sister     2 stents  . Breast cancer Neg Hx    Allergies  Allergen Reactions  . Alcohol     Gastric pain  . Gelatin Capsule Empty  [Gelatin]     Stomach pains  . Gemfibrozil     Stomach pains, urine retention and muscle weakness.  . Meloxicam     Drastically alters taste  . Nsaids Other (See Comments)    Tolerates aspirin stomach pains and reflux  . Oxycodone Itching    Trembling  . Simvastatin Other (See Comments)    Feet and leg pain and general body weakness.  . Sulfa Antibiotics Nausea And Vomiting and Other (See Comments)  . Clarithromycin Rash and Nausea And Vomiting    Terrible taste in mouth and  stomach ache.  . Lopressor  [Metoprolol] Palpitations  . Niacin Other (See Comments) and Rash    Flushing  . Ofloxacin Rash and Nausea Only     Previous Medications   ACETAMINOPHEN (TYLENOL) 500 MG TABLET    Take by mouth.   ALPHA-D-GALACTOSIDASE (BEANO TO GO) TABS    Take by mouth.   ASPIRIN 81 MG TABLET    Take by mouth.   CALCIUM CITRATE PO    Take by mouth.   CHOLECALCIFEROL (VITAMIN D) 1000 UNITS TABLET    Take by mouth.   COENZYME Q10 (COQ10) 100 MG CAPS    Take by mouth.   CYANOCOBALAMIN 1000 MCG TABLET    Take by mouth.   DIFLUPREDNATE 0.05 % EMUL     Apply to eye.   DIPHENOXYLATE-ATROPINE (LOMOTIL) 2.5-0.025 MG TABLET    Take 1 tablet by mouth 4 (four) times daily as needed for diarrhea or loose stools.   FERROUS SULFATE 325 (65 FE) MG TABLET    Take by mouth.   FOLIC ACID (FOLVITE) 1 MG TABLET    Take by mouth.   FUROSEMIDE (LASIX) 40 MG TABLET    TAKE 1/2 TABLET BY MOUTH TWICE A DAY   KETOTIFEN (ZADITOR) 0.025 % OPHTHALMIC SOLUTION    1 drop 2 (two) times daily.   KLOR-CON M20 20 MEQ TABLET    TAKE 1 TABLET TWICE A DAY   LIDOCAINE (LIDODERM) 5 %    LIDODERM, 5% (External Patch)  one to three Patch Patch Patch may apply up to 3 x 12 hours then off 12 hours for 0 days  Quantity: 30;  Refills: 5   Ordered :28-Sep-2013  [name] ;  Started 28-Sep-2013 Active   LORATADINE (CLARITIN) 10 MG TABLET    Take by mouth.   MELATONIN EXTRA STRENGTH PO    Take 0.25 tablets by mouth at bedtime as needed.   MOMETASONE (NASONEX) 50 MCG/ACT NASAL SPRAY    2 sprays every evening.   MULTIPLE VITAMINS-MINERALS ([employer]S SPECTRAVITE ADULT 50+ PO)    Education officer, community - Historical Medication  daily Active   NEPAFENAC (ILEVRO) 0.3 % OPHTHALMIC SUSPENSION    Apply to eye.   NYSTATIN CREAM (MYCOSTATIN)    Apply 1 application topically 2 (two) times daily.   PANTOPRAZOLE (PROTONIX) 40 MG TABLET    TAKE 1 TABLET TWO TIMES DAILY   POLYETHYL GLYCOL-PROPYL GLYCOL (SYSTANE) 0.4-0.3 % SOLN    Apply to eye.   POLYETHYLENE GLYCOL 3350 POWD    Take by mouth.   PROBIOTIC PRODUCT ([employer]S SENIOR PROBIOTIC) CAPS    Take by mouth.   PROPRANOLOL (INDERAL) 40 MG TABLET    TAKE 1/2 TABLET BY MOUTH TWICE A DAY   PSYLLIUM HUSK POWD    Take by mouth.   PYRIDOXINE (B-6) 100 MG TABLET    Take by mouth.   RANITIDINE (ZANTAC) 150 MG TABLET    Take by mouth.   ROSUVASTATIN (CRESTOR) 5 MG TABLET    TAKE 1 TABLET DAILY EXCEPT THURSDAY AND SUNDAY    Review of Systems  Constitutional: Negative.   Respiratory: Negative.   Cardiovascular: Negative.   Gastrointestinal:  Positive for diarrhea.    Social History  Substance Use Topics  . Smoking status: Never Smoker  . Smokeless tobacco: Never Used  . Alcohol use No   Objective:   BP 138/78 (BP Location: Right Arm, Patient Position: Sitting, Cuff Size: Normal)   Pulse 63   Temp 97.6 F (36.4  C) (Oral)   Resp 14   Wt 117 lb 12.8 oz (53.4 kg)   BMI 24.62 kg/m   Physical Exam  Constitutional: She is oriented to person, place, and time. She appears well-developed and well-nourished. No distress.  HENT:  Head: Normocephalic and atraumatic.  Right Ear: Hearing normal.  Left Ear: Hearing normal.  Nose: Nose normal.  Eyes: Conjunctivae and lids are normal. Right eye exhibits no discharge. Left eye exhibits no discharge. No scleral icterus.  Neck: Neck supple.  Cardiovascular: Normal rate and regular rhythm.   Pulmonary/Chest: Effort normal and breath sounds normal. No respiratory distress.  Abdominal: Soft. Bowel sounds are normal. There is tenderness.  Slight left abdominal soreness with occasional discomfort in all areas.  Musculoskeletal: Normal range of motion.  Neurological: She is alert and oriented to person, place, and time.  Skin: Skin is intact. No lesion and no rash noted.  Psychiatric: She has a normal mood and affect. Her speech is normal and behavior is normal. Thought content normal.      Assessment & Plan:     1. Diarrhea, unspecified type Diarrhea stopped while taking the Doxycycline. WBC count on CBC was elevated but stool tests/culture was negative. Will recheck labs for return to normal. Had yellow diarrhea restart 2 days ago (finished the Doxycycline 1 week ago). Recommend she stop the Miralax and Metamucil for now. May need recheck with Dr. Allen Norris (gastroenterologist) if no better soon. - CBC with Differential/Platelet - Comprehensive metabolic panel  2. 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 Allen Norris December 2016. Multiple small mouthed  diverticula in the sigmoid and descending colon. Recheck labs since diarrhea restarted with some left abdomen discomfort. May need recheck with Dr. Allen Norris. - CBC with Differential/Platelet  3. Leukocytosis, unspecified type WBC count was 11,000 with creatinine 1.30 on 07-09-16.  Suspected secondary to diverticular disease with infection. Denies fever. Will recheck CBC and CMP to check for resolution. - CBC with Differential/Platelet - Comprehensive metabolic panel

## 2016-07-27 LAB — CBC WITH DIFFERENTIAL/PLATELET
BASOS ABS: 0 10*3/uL (ref 0.0–0.2)
Basos: 0 %
EOS (ABSOLUTE): 0.4 10*3/uL (ref 0.0–0.4)
EOS: 5 %
HEMATOCRIT: 42.8 % (ref 34.0–46.6)
HEMOGLOBIN: 15 g/dL (ref 11.1–15.9)
Immature Grans (Abs): 0 10*3/uL (ref 0.0–0.1)
Immature Granulocytes: 0 %
LYMPHS: 21 %
Lymphocytes Absolute: 1.7 10*3/uL (ref 0.7–3.1)
MCH: 32.9 pg (ref 26.6–33.0)
MCHC: 35 g/dL (ref 31.5–35.7)
MCV: 94 fL (ref 79–97)
MONOCYTES: 12 %
Monocytes Absolute: 1 10*3/uL — ABNORMAL HIGH (ref 0.1–0.9)
NEUTROS ABS: 4.9 10*3/uL (ref 1.4–7.0)
Neutrophils: 62 %
Platelets: 352 10*3/uL (ref 150–379)
RBC: 4.56 x10E6/uL (ref 3.77–5.28)
RDW: 12.5 % (ref 12.3–15.4)
WBC: 8 10*3/uL (ref 3.4–10.8)

## 2016-07-27 LAB — COMPREHENSIVE METABOLIC PANEL
ALBUMIN: 4.5 g/dL (ref 3.5–4.8)
ALT: 15 IU/L (ref 0–32)
AST: 20 IU/L (ref 0–40)
Albumin/Globulin Ratio: 2 (ref 1.2–2.2)
Alkaline Phosphatase: 52 IU/L (ref 39–117)
BILIRUBIN TOTAL: 1.2 mg/dL (ref 0.0–1.2)
BUN / CREAT RATIO: 18 (ref 12–28)
BUN: 23 mg/dL (ref 8–27)
CO2: 27 mmol/L (ref 18–29)
CREATININE: 1.29 mg/dL — AB (ref 0.57–1.00)
Calcium: 9.6 mg/dL (ref 8.7–10.3)
Chloride: 94 mmol/L — ABNORMAL LOW (ref 96–106)
GFR calc non Af Amer: 40 mL/min/{1.73_m2} — ABNORMAL LOW (ref >59)
GFR, EST AFRICAN AMERICAN: 46 mL/min/{1.73_m2} — AB (ref >59)
GLOBULIN, TOTAL: 2.2 g/dL (ref 1.5–4.5)
GLUCOSE: 106 mg/dL — AB (ref 65–99)
Potassium: 3.5 mmol/L (ref 3.5–5.2)
Sodium: 139 mmol/L (ref 134–144)
Total Protein: 6.7 g/dL (ref 6.0–8.5)

## 2016-07-28 ENCOUNTER — Telehealth: Payer: Self-pay | Admitting: Physician Assistant

## 2016-07-28 NOTE — Telephone Encounter (Signed)
Pt is calling lab results.  Thanks,  [employer]

## 2016-07-29 NOTE — Telephone Encounter (Signed)
See result note attached to lab reports.

## 2016-07-29 NOTE — Telephone Encounter (Signed)
LMTCB

## 2016-07-29 NOTE — Telephone Encounter (Signed)
Patient advised.

## 2016-07-29 NOTE — Telephone Encounter (Signed)
-----   Message from [employer], [place] sent at 07/29/2016 10:37 AM EDT ----- WBC count back to normal. Kidney function stable. If changes in medications have not started to clear diarrhea and left abdominal discomfort, should schedule recheck with Dr. Allen Norris.

## 2016-08-06 ENCOUNTER — Ambulatory Visit (INDEPENDENT_AMBULATORY_CARE_PROVIDER_SITE_OTHER): Payer: Medicare Other

## 2016-08-06 DIAGNOSIS — Z23 Encounter for immunization: Secondary | ICD-10-CM | POA: Diagnosis not present

## 2016-09-15 ENCOUNTER — Ambulatory Visit (INDEPENDENT_AMBULATORY_CARE_PROVIDER_SITE_OTHER): Payer: Medicare Other | Admitting: Physician Assistant

## 2016-09-15 ENCOUNTER — Encounter: Payer: Self-pay | Admitting: Physician Assistant

## 2016-09-15 VITALS — BP 138/86 | HR 68 | Temp 98.4°F | Resp 16 | Ht 58.5 in | Wt 123.0 lb

## 2016-09-15 DIAGNOSIS — Z0000 Encounter for general adult medical examination without abnormal findings: Secondary | ICD-10-CM | POA: Diagnosis not present

## 2016-09-15 DIAGNOSIS — D696 Thrombocytopenia, unspecified: Secondary | ICD-10-CM

## 2016-09-15 DIAGNOSIS — R739 Hyperglycemia, unspecified: Secondary | ICD-10-CM | POA: Diagnosis not present

## 2016-09-15 DIAGNOSIS — N182 Chronic kidney disease, stage 2 (mild): Secondary | ICD-10-CM

## 2016-09-15 DIAGNOSIS — J0140 Acute pansinusitis, unspecified: Secondary | ICD-10-CM

## 2016-09-15 DIAGNOSIS — E7800 Pure hypercholesterolemia, unspecified: Secondary | ICD-10-CM | POA: Diagnosis not present

## 2016-09-15 MED ORDER — AMOXICILLIN-POT CLAVULANATE 875-125 MG PO TABS: 1.0000 | ORAL_TABLET | Freq: Two times a day (BID) | ORAL | 0 refills | Status: DC

## 2016-09-15 NOTE — Patient Instructions (Signed)

## 2016-09-15 NOTE — Progress Notes (Signed)
Patient: Sheila Kim, Female    DOB: 1940-12-05, 75 y.o.   MRN: [id number] Visit Date: 09/15/2016  Today's Provider: Mar Daring, PA-C   Chief Complaint  Patient presents with  . Medicare Wellness  . Sinusitis   Subjective:    Annual wellness visit Sheila Kim is a 76 y.o. female. She feels fairly well. She reports exercising daily. Walks on treadmill and strengthening exercises. She reports she is sleeping fairly well. ----------------------------------------------------------- Last mammogram- 03/30/2016- BI-RADS 1 Last colonoscopy- 10/03/2015- Diverticulosis and non-bleeding internal hemorrhoids. Last BMD- 04/09/2009- osteoporosis. Vaccines UTD.  Sinus Pain: Patient complains of facial pain, nasal congestion, post nasal drip, sinus pressure, sneezing, tooth pain and headache. Onset of symptoms was 1 week ago, gradually worsening since that time. Pt has tried Simply Saline and Tylenol, with some relief.   Review of Systems  Constitutional: Negative.   HENT: Positive for postnasal drip, rhinorrhea, sinus pain, sinus pressure and sneezing.   Eyes: Positive for itching.  Respiratory: Negative.   Cardiovascular: Positive for leg swelling.  Gastrointestinal: Negative.   Endocrine: Negative.   Genitourinary: Positive for enuresis.  Musculoskeletal: Positive for arthralgias, back pain and myalgias.  Skin: Negative.   Allergic/Immunologic: Positive for environmental allergies.  Neurological: Positive for tremors and headaches.  Hematological: Bruises/bleeds easily.  Psychiatric/Behavioral: Negative.     Social History   Social History  . Marital status: Widowed    Spouse name: Hassell Done  . Number of children: 2  . Years of education: N/A   Occupational History  . retired    Social History Main Topics  . Smoking status: Never Smoker  . Smokeless tobacco: Never Used  . Alcohol use No  . Drug use: No  . Sexual activity: Not on file    Other Topics Concern  . Not on file   Social History Narrative  . No narrative on file    Past Medical History:  Diagnosis Date  . Anemia    history  . Arthritis    hands, spine  . Chronic kidney disease    reports one kidney "doesn't work well"  . GERD (gastroesophageal reflux disease)   . Hypertension   . Motion sickness    boats  . Seasonal allergies      Patient Active Problem List   Diagnosis Date Noted  . Special screening for malignant neoplasms, colon   . Tinea corporis 09/17/2015  . Colon cancer screening 09/17/2015  . Allergic rhinitis 02/12/2015  . Atherosclerosis of coronary artery 02/12/2015  . Chronic kidney disease (CKD), stage II (mild) 02/12/2015  . Colitis 02/12/2015  . DD (diverticular disease) 02/12/2015  . Gastroesophageal reflux disease with hiatal hernia 02/12/2015  . Personal history of methicillin resistant Staphylococcus aureus 02/12/2015  . H/O arthrodesis 02/12/2015  . Hypercholesteremia 02/12/2015  . Blood glucose elevated 02/12/2015  . BP (high blood pressure) 02/12/2015  . Heart & renal disease, hypertensive, 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[place]) 02/12/2015  . Arthritis, degenerative 02/12/2015  . OP (osteoporosis) 02/12/2015  . Impaired renal function 02/12/2015  . Avitaminosis D 02/12/2015  . Heart attack 04/15/2014  . Myocardial infarction 04/15/2014  . Pleural cavity effusion 06/08/2013  . Change in blood platelet count 05/21/2013  . Thrombocytopenia ([place]) 05/21/2013  . Disorder of right ventricle of heart 05/20/2013  . Heart disease 05/20/2013  . H/O coronary artery bypass surgery 05/17/2013  . History of knee surgery 05/16/2013  . Arteriosclerosis of coronary artery 05/14/2013    Past Surgical History:  Procedure Laterality Date  . ABDOMINAL HYSTERECTOMY    . BACK SURGERY    . BREAST CYST ASPIRATION Left   . COLONOSCOPY WITH PROPOFOL N/A 10/03/2015   Procedure: COLONOSCOPY WITH PROPOFOL;  Surgeon: Lucilla Lame, MD;  Location:  [place];  Service: Endoscopy;  Laterality: N/A;  . 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 DILATION AND CURETTAGE OF UTERUS    . EYE SURGERY Bilateral 2015   cataract  . NOSE SURGERY  1962   deviated septum, hemorrhaged- pt had to return to hospital  . [place]  . ureteropelvic junction obstruction      Her family history includes COPD in her father; Diabetes in her father, mother, and sister; Heart attack in her father and mother; Heart disease in her mother and sister; Stroke in her brother.      Current Outpatient Prescriptions:  .  acetaminophen (TYLENOL) 500 MG tablet, Take by mouth., Disp: , Rfl:  .  Alpha-D-Galactosidase (BEANO TO GO) TABS, Take by mouth., Disp: , Rfl:  .  aspirin 81 MG tablet, Take by mouth., Disp: , Rfl:  .  CALCIUM CITRATE PO, Take by mouth., Disp: , Rfl:  .  cholecalciferol (VITAMIN D) 1000 UNITS tablet, Take by mouth., Disp: , Rfl:  .  Coenzyme Q10 (COQ10) 100 MG CAPS, Take by mouth., Disp: , Rfl:  .  cyanocobalamin 1000 MCG tablet, Take by mouth., Disp: , Rfl:  .  ferrous sulfate 325 (65 FE) MG tablet, Take by mouth., Disp: , Rfl:  .  folic acid (FOLVITE) 1 MG tablet, Take by mouth., Disp: , Rfl:  .  furosemide (LASIX) 40 MG tablet, TAKE 1/2 TABLET BY MOUTH TWICE A DAY, Disp: 180 tablet, Rfl: 1 .  KLOR-CON M20 20 MEQ tablet, TAKE 1 TABLET TWICE A DAY, Disp: 180 tablet, Rfl: 1 .  lidocaine (LIDODERM) 5 %, LIDODERM, 5% (External Patch)  one to three Patch Patch Patch may apply up to 3 x 12 hours then off 12 hours for 0 days  Quantity: 30;  Refills: 5   Ordered :28-Sep-2013  [name] ;  Started 28-Sep-2013 Active, Disp: , Rfl:  .  loratadine (CLARITIN) 10 MG tablet, Take by mouth., Disp: , Rfl:  .  MELATONIN EXTRA STRENGTH PO, Take 0.25 tablets by mouth at bedtime as needed., Disp: , Rfl:  .  Multiple Vitamins-Minerals ([employer]S SPECTRAVITE ADULT 50+ PO), Education officer, community - Historical Medication   daily Active, Disp: , Rfl:  .  pantoprazole (PROTONIX) 40 MG tablet, TAKE 1 TABLET TWO TIMES DAILY, Disp: 180 tablet, Rfl: 12 .  Polyethyl Glycol-Propyl Glycol (SYSTANE) 0.4-0.3 % SOLN, Apply to eye., Disp: , Rfl:  .  Polyethylene Glycol 3350 POWD, Take by mouth., Disp: , Rfl:  .  Probiotic Product ([employer]S SENIOR PROBIOTIC) CAPS, Take by mouth., Disp: , Rfl:  .  propranolol (INDERAL) 40 MG tablet, TAKE 1/2 TABLET BY MOUTH TWICE A DAY, Disp: 180 tablet, Rfl: 1 .  Psyllium Husk POWD, Take by mouth., Disp: , Rfl:  .  pyridoxine (B-6) 100 MG tablet, Take by mouth., Disp: , Rfl:  .  ranitidine (ZANTAC) 150 MG tablet, Take by mouth., Disp: , Rfl:  .  rosuvastatin (CRESTOR) 5 MG tablet, TAKE 1 TABLET DAILY EXCEPT THURSDAY AND SUNDAY, Disp: 90 tablet, Rfl: 3 .  triamcinolone (NASACORT ALLERGY 24HR) 55 MCG/ACT AERO nasal inhaler, Place 2 sprays into the nose daily., Disp: , Rfl:  .  nystatin cream (  MYCOSTATIN), Apply 1 application topically 2 (two) times daily. (Patient not taking: Reported on 09/15/2016), Disp: 30 g, Rfl: 0  Patient Care Team: Mar Daring, PA-C as PCP - General (Family Medicine)     Objective:   Vitals: BP 138/86 (BP Location: Left Arm, Patient Position: Sitting, Cuff Size: Normal)   Pulse 68   Temp 98.4 F (36.9 C) (Oral)   Resp 16   Ht 4' 10.5" (1.486 m)   Wt 123 lb (55.8 kg)   BMI 25.27 kg/m   Physical Exam  Constitutional: She is oriented to person, place, and time. She appears well-developed and well-nourished. No distress.  HENT:  Head: Normocephalic and atraumatic.  Right Ear: Tympanic membrane, external ear and ear canal normal.  Left Ear: Tympanic membrane, external ear and ear canal normal.  Nose: Mucosal edema and rhinorrhea present. Right sinus exhibits maxillary sinus tenderness and frontal sinus tenderness. Left sinus exhibits maxillary sinus tenderness and frontal sinus tenderness.  Mouth/Throat: Uvula is midline and mucous membranes are normal.  Posterior oropharyngeal erythema present. No oropharyngeal exudate or posterior oropharyngeal edema.  Eyes: Conjunctivae and EOM are normal. Pupils are equal, round, and reactive to light. Right eye exhibits no discharge. Left eye exhibits no discharge. No scleral icterus.  Neck: Normal range of motion. Neck supple. No JVD present. Carotid bruit is not present. No tracheal deviation present. No thyromegaly present.  Cardiovascular: Normal rate, regular rhythm, normal heart sounds and intact distal pulses.  Exam reveals no gallop and no friction rub.   No murmur heard. Pulmonary/Chest: Effort normal and breath sounds normal. No respiratory distress. She has no wheezes. She has no rales. She exhibits no tenderness.  Abdominal: Soft. Bowel sounds are normal. She exhibits no distension and no mass. There is no tenderness. There is no rebound and no guarding.  Musculoskeletal: Normal range of motion. She exhibits no edema or tenderness.  Lymphadenopathy:    She has no cervical adenopathy.  Neurological: She is alert and oriented to person, place, and time.  Skin: Skin is warm and dry. No rash noted. She is not diaphoretic.  Psychiatric: She has a normal mood and affect. Her behavior is normal. Judgment and thought content normal.  Vitals reviewed.   Activities of Daily Living In your present state of health, do you have any difficulty performing the following activities: 10/03/2015  Hearing? N  Vision? N  Difficulty concentrating or making decisions? N  Walking or climbing stairs? N  Dressing or bathing? Y  Some recent data might be hidden    Fall Risk Assessment Fall Risk  09/15/2016 03/18/2015  Falls in the past year? No No     Depression Screen PHQ 2/9 Scores 09/15/2016 03/18/2015  PHQ - 2 Score 0 0    Cognitive Testing - 6-CIT  Correct? Score   What year is it? yes 0 0 or 4  What month is it? yes 0 0 or 3  Memorize:    Pia Mau,  [address],      What time is it?  (within 1 hour) yes 0 0 or 3  Count backwards from 20 yes 0 0, 2, or 4  Name the months of the year yes 0 0, 2, or 4  Repeat name & address above yes 0 0, 2, 4, 6, 8, or 10       TOTAL SCORE  0/28   Interpretation:  Normal  Normal (0-7) Abnormal (8-28)  Assessment & Plan:     Annual Wellness Visit  Reviewed patient's Family Medical History Reviewed and updated list of patient's medical providers Assessment of cognitive impairment was done Assessed patient's functional ability Established a written schedule for health screening [place] Completed and Reviewed  Exercise Activities and Dietary recommendations Goals    None      Immunization History  Administered Date(s) Administered  . Influenza, High Dose Seasonal PF 07/26/2015, 08/06/2016  . Pneumococcal Conjugate-13 07/16/2014  . Pneumococcal Polysaccharide-23 08/21/2005  . Tdap 07/01/2011  . Zoster 10/01/2011    Health Maintenance  Topic Date Due  . Samul Dada  06/30/2021  . INFLUENZA VACCINE  Completed  . DEXA SCAN  Completed  . ZOSTAVAX  Completed  . PNA vac Low Risk Adult  Completed     Discussed health benefits of physical activity, and encouraged her to engage in regular exercise appropriate for her age and condition.    1. Medicare annual wellness visit, subsequent Normal physical exam with the exception of the acute sinusitis.  2. Chronic kidney disease (CKD), stage II (mild) Will check labs as below and f/u pending results. - Comprehensive Metabolic Panel (CMET)  3. Hypercholesteremia Will check labs as below and f/u pending results. - Lipid Profile  4. Blood glucose elevated Will check labs as below and f/u pending results. - HgB A1c  5. Thrombocytopenia ([place]) Will check labs as below and f/u pending results. - CBC w/Diff/Platelet  6. Acute pansinusitis, recurrence not specified Worsening symptoms that have not responded to OTC medications. Will give  augmentin as below. Continue allergy medications. Stay well hydrated and get plenty of rest. Call if no symptom improvement or if symptoms worsen. - amoxicillin-clavulanate (AUGMENTIN) 875-125 MG tablet; Take 1 tablet by mouth 2 (two) times daily.  Dispense: 20 tablet; Refill: 0  ------------------------------------------------------------------------------------------------------------  Patient seen and examined by Fenton Malling, PA, and note scribed by Renaldo Fiddler, CMA.   Mar Daring, PA-C  [facility]

## 2016-09-17 LAB — LIPID PANEL
CHOLESTEROL TOTAL: 191 mg/dL (ref 100–199)
Chol/HDL Ratio: 3.8 ratio units (ref 0.0–4.4)
HDL: 50 mg/dL (ref >39)
LDL CALC: 104 mg/dL — AB (ref 0–99)
TRIGLYCERIDES: 184 mg/dL — AB (ref 0–149)
VLDL CHOLESTEROL CAL: 37 mg/dL (ref 5–40)

## 2016-09-17 LAB — CBC WITH DIFFERENTIAL/PLATELET
BASOS ABS: 0.1 10*3/uL (ref 0.0–0.2)
Basos: 1 %
EOS (ABSOLUTE): 0.4 10*3/uL (ref 0.0–0.4)
Eos: 5 %
HEMOGLOBIN: 13.9 g/dL (ref 11.1–15.9)
Hematocrit: 40.3 % (ref 34.0–46.6)
IMMATURE GRANS (ABS): 0 10*3/uL (ref 0.0–0.1)
Immature Granulocytes: 0 %
LYMPHS: 23 %
Lymphocytes Absolute: 1.9 10*3/uL (ref 0.7–3.1)
MCH: 32.8 pg (ref 26.6–33.0)
MCHC: 34.5 g/dL (ref 31.5–35.7)
MCV: 95 fL (ref 79–97)
MONOCYTES: 10 %
Monocytes Absolute: 0.8 10*3/uL (ref 0.1–0.9)
NEUTROS ABS: 5 10*3/uL (ref 1.4–7.0)
Neutrophils: 61 %
PLATELETS: 304 10*3/uL (ref 150–379)
RBC: 4.24 x10E6/uL (ref 3.77–5.28)
RDW: 13.8 % (ref 12.3–15.4)
WBC: 8.2 10*3/uL (ref 3.4–10.8)

## 2016-09-17 LAB — COMPREHENSIVE METABOLIC PANEL
ALK PHOS: 50 IU/L (ref 39–117)
ALT: 14 IU/L (ref 0–32)
AST: 19 IU/L (ref 0–40)
Albumin/Globulin Ratio: 1.8 (ref 1.2–2.2)
Albumin: 4.1 g/dL (ref 3.5–4.8)
BILIRUBIN TOTAL: 1 mg/dL (ref 0.0–1.2)
BUN/Creatinine Ratio: 15 (ref 12–28)
BUN: 16 mg/dL (ref 8–27)
CHLORIDE: 98 mmol/L (ref 96–106)
CO2: 28 mmol/L (ref 18–29)
CREATININE: 1.07 mg/dL — AB (ref 0.57–1.00)
Calcium: 9.4 mg/dL (ref 8.7–10.3)
GFR calc Af Amer: 58 mL/min/{1.73_m2} — ABNORMAL LOW (ref >59)
GFR calc non Af Amer: 50 mL/min/{1.73_m2} — ABNORMAL LOW (ref >59)
GLUCOSE: 100 mg/dL — AB (ref 65–99)
Globulin, Total: 2.3 g/dL (ref 1.5–4.5)
Potassium: 4.3 mmol/L (ref 3.5–5.2)
Sodium: 140 mmol/L (ref 134–144)
TOTAL PROTEIN: 6.4 g/dL (ref 6.0–8.5)

## 2016-09-17 LAB — HEMOGLOBIN A1C
ESTIMATED AVERAGE GLUCOSE: 114 mg/dL
HEMOGLOBIN A1C: 5.6 % (ref 4.8–5.6)

## 2016-11-30 ENCOUNTER — Other Ambulatory Visit: Payer: Self-pay

## 2016-11-30 MED ORDER — FUROSEMIDE 40 MG PO TABS: 20.0000 mg | ORAL_TABLET | Freq: Two times a day (BID) | ORAL | 1 refills | Status: DC

## 2017-02-19 ENCOUNTER — Other Ambulatory Visit: Payer: Self-pay | Admitting: Physician Assistant

## 2017-02-19 DIAGNOSIS — I10 Essential (primary) hypertension: Secondary | ICD-10-CM

## 2017-03-16 ENCOUNTER — Encounter: Payer: Self-pay | Admitting: Physician Assistant

## 2017-03-16 ENCOUNTER — Ambulatory Visit (INDEPENDENT_AMBULATORY_CARE_PROVIDER_SITE_OTHER): Payer: Medicare Other | Admitting: Physician Assistant

## 2017-03-16 VITALS — BP 110/70 | HR 60 | Temp 98.0°F | Resp 16 | Wt 128.0 lb

## 2017-03-16 DIAGNOSIS — Z6826 Body mass index (BMI) 26.0-26.9, adult: Secondary | ICD-10-CM

## 2017-03-16 DIAGNOSIS — Z1231 Encounter for screening mammogram for malignant neoplasm of breast: Secondary | ICD-10-CM | POA: Diagnosis not present

## 2017-03-16 DIAGNOSIS — I10 Essential (primary) hypertension: Secondary | ICD-10-CM | POA: Diagnosis not present

## 2017-03-16 DIAGNOSIS — E7800 Pure hypercholesterolemia, unspecified: Secondary | ICD-10-CM

## 2017-03-16 DIAGNOSIS — Z1239 Encounter for other screening for malignant neoplasm of breast: Secondary | ICD-10-CM

## 2017-03-16 DIAGNOSIS — D696 Thrombocytopenia, unspecified: Secondary | ICD-10-CM | POA: Diagnosis not present

## 2017-03-16 DIAGNOSIS — N182 Chronic kidney disease, stage 2 (mild): Secondary | ICD-10-CM | POA: Diagnosis not present

## 2017-03-16 DIAGNOSIS — R739 Hyperglycemia, unspecified: Secondary | ICD-10-CM

## 2017-03-16 LAB — POCT GLYCOSYLATED HEMOGLOBIN (HGB A1C): HEMOGLOBIN A1C: 5.6

## 2017-03-16 NOTE — Patient Instructions (Signed)
DASH Eating Plan DASH stands for "Dietary Approaches to Stop Hypertension." The DASH eating plan is a healthy eating plan that has been shown to reduce high blood pressure (hypertension). It may also reduce your risk for type 2 diabetes, heart disease, and stroke. The DASH eating plan may also help with weight loss. What are tips for following this plan? General guidelines  Avoid eating more than 2,300 mg (milligrams) of salt (sodium) a day. If you have hypertension, you may need to reduce your sodium intake to 1,500 mg a day.  Limit alcohol intake to no more than 1 drink a day for nonpregnant women and 2 drinks a day for men. One drink equals 12 oz of beer, 5 oz of wine, or 1 oz of hard liquor.  Work with your health care provider to maintain a healthy body weight or to lose weight. Ask what an ideal weight is for you.  Get at least 30 minutes of exercise that causes your heart to beat faster (aerobic exercise) most days of the week. Activities may include walking, swimming, or biking.  Work with your health care provider or diet and nutrition specialist (dietitian) to adjust your eating plan to your individual calorie needs. Reading food labels  Check food labels for the amount of sodium per serving. Choose foods with less than 5 percent of the Daily Value of sodium. Generally, foods with less than 300 mg of sodium per serving fit into this eating plan.  To find whole grains, look for the word "whole" as the first word in the ingredient list. Shopping  Buy products labeled as "low-sodium" or "no salt added."  Buy fresh foods. Avoid canned foods and premade or frozen meals. Cooking  Avoid adding salt when cooking. Use salt-free seasonings or herbs instead of table salt or sea salt. Check with your health care provider or pharmacist before using salt substitutes.  Do not fry foods. Cook foods using healthy methods such as baking, boiling, grilling, and broiling instead.  Cook with  heart-healthy oils, such as olive, canola, soybean, or sunflower oil. Meal planning   Eat a balanced diet that includes: ? 5 or more servings of fruits and vegetables each day. At each meal, try to fill half of your plate with fruits and vegetables. ? Up to 6-8 servings of whole grains each day. ? Less than 6 oz of lean meat, poultry, or fish each day. A 3-oz serving of meat is about the same size as a deck of cards. One egg equals 1 oz. ? 2 servings of low-fat dairy each day. ? A serving of nuts, seeds, or beans 5 times each week. ? Heart-healthy fats. Healthy fats called Omega-3 fatty acids are found in foods such as flaxseeds and coldwater fish, like sardines, salmon, and mackerel.  Limit how much you eat of the following: ? Canned or prepackaged foods. ? Food that is high in trans fat, such as fried foods. ? Food that is high in saturated fat, such as fatty meat. ? Sweets, desserts, sugary drinks, and other foods with added sugar. ? Full-fat dairy products.  Do not salt foods before eating.  Try to eat at least 2 vegetarian meals each week.  Eat more home-cooked food and less restaurant, buffet, and fast food.  When eating at a restaurant, ask that your food be prepared with less salt or no salt, if possible. What foods are recommended? The items listed may not be a complete list. Talk with your dietitian about what   dietary choices are best for you. Grains Whole-grain or whole-wheat bread. Whole-grain or whole-wheat pasta. Brown rice. Oatmeal. Quinoa. Bulgur. Whole-grain and low-sodium cereals. Pita bread. Low-fat, low-sodium crackers. Whole-wheat flour tortillas. Vegetables Fresh or frozen vegetables (raw, steamed, roasted, or grilled). Low-sodium or reduced-sodium tomato and vegetable juice. Low-sodium or reduced-sodium tomato sauce and tomato paste. Low-sodium or reduced-sodium canned vegetables. Fruits All fresh, dried, or frozen fruit. Canned fruit in natural juice (without  added sugar). Meat and other protein foods Skinless chicken or turkey. Ground chicken or turkey. Pork with fat trimmed off. Fish and seafood. Egg whites. Dried beans, peas, or lentils. Unsalted nuts, nut butters, and seeds. Unsalted canned beans. Lean cuts of beef with fat trimmed off. Low-sodium, lean deli meat. Dairy Low-fat (1%) or fat-free (skim) milk. Fat-free, low-fat, or reduced-fat cheeses. Nonfat, low-sodium ricotta or cottage cheese. Low-fat or nonfat yogurt. Low-fat, low-sodium cheese. Fats and oils Soft margarine without trans fats. Vegetable oil. Low-fat, reduced-fat, or light mayonnaise and salad dressings (reduced-sodium). Canola, safflower, olive, soybean, and sunflower oils. Avocado. Seasoning and other foods Herbs. Spices. Seasoning mixes without salt. Unsalted popcorn and pretzels. Fat-free sweets. What foods are not recommended? The items listed may not be a complete list. Talk with your dietitian about what dietary choices are best for you. Grains Baked goods made with fat, such as croissants, muffins, or some breads. Dry pasta or rice meal packs. Vegetables Creamed or fried vegetables. Vegetables in a cheese sauce. Regular canned vegetables (not low-sodium or reduced-sodium). Regular canned tomato sauce and paste (not low-sodium or reduced-sodium). Regular tomato and vegetable juice (not low-sodium or reduced-sodium). Pickles. Olives. Fruits Canned fruit in a light or heavy syrup. Fried fruit. Fruit in cream or butter sauce. Meat and other protein foods Fatty cuts of meat. Ribs. Fried meat. Bacon. Sausage. Bologna and other processed lunch meats. Salami. Fatback. Hotdogs. Bratwurst. Salted nuts and seeds. Canned beans with added salt. Canned or smoked fish. Whole eggs or egg yolks. Chicken or turkey with skin. Dairy Whole or 2% milk, cream, and half-and-half. Whole or full-fat cream cheese. Whole-fat or sweetened yogurt. Full-fat cheese. Nondairy creamers. Whipped toppings.  Processed cheese and cheese spreads. Fats and oils Butter. Stick margarine. Lard. Shortening. Ghee. Bacon fat. Tropical oils, such as coconut, palm kernel, or palm oil. Seasoning and other foods Salted popcorn and pretzels. Onion salt, garlic salt, seasoned salt, table salt, and sea salt. Worcestershire sauce. Tartar sauce. Barbecue sauce. Teriyaki sauce. Soy sauce, including reduced-sodium. Steak sauce. Canned and packaged gravies. Fish sauce. Oyster sauce. Cocktail sauce. Horseradish that you find on the shelf. Ketchup. Mustard. Meat flavorings and tenderizers. Bouillon cubes. Hot sauce and Tabasco sauce. Premade or packaged marinades. Premade or packaged taco seasonings. Relishes. Regular salad dressings. Where to find more information:  National Heart, Lung, and Blood Institute: www.nhlbi.nih.gov  American Heart Association: www.heart.org Summary  The DASH eating plan is a healthy eating plan that has been shown to reduce high blood pressure (hypertension). It may also reduce your risk for type 2 diabetes, heart disease, and stroke.  With the DASH eating plan, you should limit salt (sodium) intake to 2,300 mg a day. If you have hypertension, you may need to reduce your sodium intake to 1,500 mg a day.  When on the DASH eating plan, aim to eat more fresh fruits and vegetables, whole grains, lean proteins, low-fat dairy, and heart-healthy fats.  Work with your health care provider or diet and nutrition specialist (dietitian) to adjust your eating plan to your individual   calorie needs. This information is not intended to replace advice given to you by your health care provider. Make sure you discuss any questions you have with your health care provider. Document Released: 09/16/2011 Document Revised: 09/20/2016 Document Reviewed: 09/20/2016 Elsevier Interactive Patient Education  2017 [employer] Inc.  

## 2017-03-16 NOTE — Progress Notes (Signed)
Patient: Sheila Kim Female    DOB: March 22, 1941   76 y.o.   MRN: [id number] Visit Date: 03/16/2017  Today's Provider: Mar Daring, PA-C   Chief Complaint  Patient presents with  . Hypertension  . Hyperglycemia  . Follow-up   Subjective:    HPI  Hypertension, follow-up:  BP Readings from Last 3 Encounters:  03/16/17 110/70  09/15/16 138/86  07/26/16 138/78    She was last seen for hypertension 6 months ago.  BP at that visit was 138/86. Management changes since that visit include checking labs. She reports excellent compliance with treatment. She is not having side effects.  She is exercising. She is adherent to low salt diet.   Outside blood pressures are stable. She is experiencing lower extremity edema.  Patient denies chest pain.   Cardiovascular risk factors include advanced age (older than 5 for men, 72 for women) and hypertension.  Use of agents associated with hypertension: none.     Weight trend: stable Wt Readings from Last 3 Encounters:  03/16/17 128 lb (58.1 kg)  09/15/16 123 lb (55.8 kg)  07/26/16 117 lb 12.8 oz (53.4 kg)    Current diet: in general, a "healthy" diet    ------------------------------------------------------------------------   Prediabetes, Follow-up:   Lab Results  Component Value Date   HGBA1C 5.6 09/16/2016   HGBA1C 6.0 03/17/2016   HGBA1C 5.5 09/17/2015   GLUCOSE 100 (H) 09/16/2016   GLUCOSE 106 (H) 07/26/2016   GLUCOSE 113 (H) 07/06/2016    Last seen for for this6 months ago.  Management since that visit includes check labs. Current symptoms include none and have been stable.  Weight trend: stable Prior visit with dietician: no Current diet: in general, a "healthy" diet   Current exercise: bicycling and walking  Pertinent Labs:    Component Value Date/Time   CHOL 191 09/16/2016 0854   TRIG 184 (H) 09/16/2016 0854   CHOLHDL 3.8 09/16/2016 0854   CREATININE 1.07 (H) 09/16/2016 0854   CREATININE 0.99 05/13/2013 0452    Wt Readings from Last 3 Encounters:  03/16/17 128 lb (58.1 kg)  09/15/16 123 lb (55.8 kg)  07/26/16 117 lb 12.8 oz (53.4 kg)    Follow up for CKD stage II  The patient was last seen for this 6 months ago. Changes made at last visit include labs checked.  She reports excellent compliance with treatment. She feels that condition is Improved. She is not having side effects.  ------------------------------------------------------------------------------------    Allergies  Allergen Reactions  . Alcohol     Gastric pain  . Gelatin Capsule Empty  [Gelatin]     Stomach pains  . Gemfibrozil     Stomach pains, urine retention and muscle weakness.  . Meloxicam     Drastically alters taste  . Nsaids Other (See Comments)    Tolerates aspirin stomach pains and reflux  . Oxycodone Itching    Trembling  . Simvastatin Other (See Comments)    Feet and leg pain and general body weakness.  . Sulfa Antibiotics Nausea And Vomiting and Other (See Comments)  . Clarithromycin Rash and Nausea And Vomiting    Terrible taste in mouth and stomach ache.  . Lopressor  [Metoprolol] Palpitations  . Niacin Other (See Comments) and Rash    Flushing  . Ofloxacin Rash and Nausea Only     Current Outpatient Prescriptions:  .  acetaminophen (TYLENOL) 500 MG tablet, Take by mouth., Disp: , Rfl:  .  Alpha-D-Galactosidase (BEANO TO GO) TABS, Take by mouth., Disp: , Rfl:  .  aspirin 81 MG tablet, Take by mouth., Disp: , Rfl:  .  CALCIUM CITRATE PO, Take by mouth., Disp: , Rfl:  .  cholecalciferol (VITAMIN D) 1000 UNITS tablet, Take by mouth., Disp: , Rfl:  .  Coenzyme Q10 (COQ10) 100 MG CAPS, Take by mouth., Disp: , Rfl:  .  cyanocobalamin 1000 MCG tablet, Take by mouth., Disp: , Rfl:  .  ferrous sulfate 325 (65 FE) MG tablet, Take by mouth., Disp: , Rfl:  .  fluticasone (FLONASE) 50 MCG/ACT nasal spray, Place 2 sprays into both nostrils daily., Disp: , Rfl:  .   folic acid (FOLVITE) 1 MG tablet, Take by mouth., Disp: , Rfl:  .  furosemide (LASIX) 40 MG tablet, Take 0.5 tablets (20 mg total) by mouth 2 (two) times daily., Disp: 90 tablet, Rfl: 1 .  KLOR-CON M20 20 MEQ tablet, TAKE 1 TABLET TWICE A DAY, Disp: 180 tablet, Rfl: 1 .  lidocaine (LIDODERM) 5 %, LIDODERM, 5% (External Patch)  one to three Patch Patch Patch may apply up to 3 x 12 hours then off 12 hours for 0 days  Quantity: 30;  Refills: 5   Ordered :28-Sep-2013  [name] ;  Started 28-Sep-2013 Active, Disp: , Rfl:  .  loratadine (CLARITIN) 10 MG tablet, Take by mouth., Disp: , Rfl:  .  MELATONIN EXTRA STRENGTH PO, Take 0.25 tablets by mouth at bedtime as needed., Disp: , Rfl:  .  Multiple Vitamins-Minerals ([employer] SPECTRAVITE ADULT 50+ PO), Education officer, community - Historical Medication  daily Active, Disp: , Rfl:  .  pantoprazole (PROTONIX) 40 MG tablet, TAKE 1 TABLET TWO TIMES DAILY, Disp: 180 tablet, Rfl: 12 .  Polyethyl Glycol-Propyl Glycol (SYSTANE) 0.4-0.3 % SOLN, Apply to eye., Disp: , Rfl:  .  Probiotic Product ([employer] SENIOR PROBIOTIC) CAPS, Take by mouth., Disp: , Rfl:  .  propranolol (INDERAL) 40 MG tablet, TAKE 1/2 TABLET BY MOUTH TWICE A DAY, Disp: 180 tablet, Rfl: 1 .  Psyllium Husk POWD, Take by mouth., Disp: , Rfl:  .  pyridoxine (B-6) 100 MG tablet, Take by mouth., Disp: , Rfl:  .  rosuvastatin (CRESTOR) 5 MG tablet, TAKE 1 TABLET DAILY EXCEPT THURSDAY AND SUNDAY, Disp: 90 tablet, Rfl: 3  Review of Systems  Constitutional: Negative.   Respiratory: Negative.   Cardiovascular: Positive for leg swelling.  Gastrointestinal: Negative.   Endocrine: Negative.     Social History  Substance Use Topics  . Smoking status: Never Smoker  . Smokeless tobacco: Never Used  . Alcohol use No   Objective:   BP 110/70 (BP Location: Left Arm, Patient Position: Sitting, Cuff Size: Large)   Pulse 60   Temp 98 F (36.7 C) (Oral)   Resp 16   Wt 128 lb (58.1 kg)   SpO2  98%   BMI 26.30 kg/m  Vitals:   03/16/17 1333  BP: 110/70  Pulse: 60  Resp: 16  Temp: 98 F (36.7 C)  TempSrc: Oral  SpO2: 98%  Weight: 128 lb (58.1 kg)     Physical Exam  Constitutional: She appears well-developed and well-nourished. No distress.  Neck: Normal range of motion. Neck supple. No JVD present. No tracheal deviation present. No thyromegaly present.  Cardiovascular: Normal rate, regular rhythm, normal heart sounds and intact distal pulses.  Exam reveals no gallop and no friction rub.   No murmur heard. Pulmonary/Chest: Effort normal and breath sounds  normal. No respiratory distress. She has no wheezes. She has no rales.  Musculoskeletal: She exhibits no edema.  Lymphadenopathy:    She has no cervical adenopathy.  Skin: She is not diaphoretic.  Vitals reviewed.      Assessment & Plan:     1. Essential hypertension Stable. Continue Propanolol 40mg . Will check labs as below and f/u pending results. - CBC w/Diff/Platelet - Comprehensive Metabolic Panel (CMET)  2. Chronic kidney disease (CKD), stage II (mild) Stable. Will check labs as below and f/u pending results. - Comprehensive Metabolic Panel (CMET)  3. Thrombocytopenia (Echo) Will check labs as below and f/u pending results. - CBC w/Diff/Platelet  4. Hypercholesteremia Stable. Continue rosuvastatin 5mg . Will check labs as below and f/u pending results. - Comprehensive Metabolic Panel (CMET) - Lipid Profile  5. Blood glucose elevated Stable and diet controlled. A1c 5.6 today in the office.  - Comprehensive Metabolic Panel (CMET) - POCT HgB A1C  6. Breast cancer screening Breast exam today was normal. There is no family history of breast cancer. She does perform regular self breast exams. Mammogram was ordered as below. Information for [facility] clinic was given to patient so she may schedule her mammogram at her convenience. - MM Digital Screening; Future  7. BMI 26.0-26.9,adult Counseled  patient on healthy lifestyle modifications including dieting and exercise. She is exercising 2-3 times per week.        Mar Daring, PA-C  [facility]

## 2017-03-18 ENCOUNTER — Telehealth: Payer: Self-pay

## 2017-03-18 LAB — CBC WITH DIFFERENTIAL/PLATELET
BASOS ABS: 0.1 10*3/uL (ref 0.0–0.2)
Basos: 1 %
EOS (ABSOLUTE): 0.5 10*3/uL — AB (ref 0.0–0.4)
Eos: 7 %
HEMOGLOBIN: 14.4 g/dL (ref 11.1–15.9)
Hematocrit: 44.3 % (ref 34.0–46.6)
IMMATURE GRANS (ABS): 0 10*3/uL (ref 0.0–0.1)
Immature Granulocytes: 0 %
LYMPHS ABS: 2.2 10*3/uL (ref 0.7–3.1)
LYMPHS: 32 %
MCH: 30.8 pg (ref 26.6–33.0)
MCHC: 32.5 g/dL (ref 31.5–35.7)
MCV: 95 fL (ref 79–97)
MONOCYTES: 11 %
Monocytes Absolute: 0.7 10*3/uL (ref 0.1–0.9)
NEUTROS ABS: 3.3 10*3/uL (ref 1.4–7.0)
Neutrophils: 49 %
PLATELETS: 328 10*3/uL (ref 150–379)
RBC: 4.68 x10E6/uL (ref 3.77–5.28)
RDW: 13.3 % (ref 12.3–15.4)
WBC: 6.7 10*3/uL (ref 3.4–10.8)

## 2017-03-18 LAB — COMPREHENSIVE METABOLIC PANEL
ALT: 16 IU/L (ref 0–32)
AST: 25 IU/L (ref 0–40)
Albumin/Globulin Ratio: 1.9 (ref 1.2–2.2)
Albumin: 4.3 g/dL (ref 3.5–4.8)
Alkaline Phosphatase: 50 IU/L (ref 39–117)
BUN/Creatinine Ratio: 19 (ref 12–28)
BUN: 20 mg/dL (ref 8–27)
Bilirubin Total: 0.9 mg/dL (ref 0.0–1.2)
CHLORIDE: 96 mmol/L (ref 96–106)
CO2: 27 mmol/L (ref 18–29)
CREATININE: 1.08 mg/dL — AB (ref 0.57–1.00)
Calcium: 9.8 mg/dL (ref 8.7–10.3)
GFR calc Af Amer: 57 mL/min/{1.73_m2} — ABNORMAL LOW (ref >59)
GFR calc non Af Amer: 49 mL/min/{1.73_m2} — ABNORMAL LOW (ref >59)
GLUCOSE: 103 mg/dL — AB (ref 65–99)
Globulin, Total: 2.3 g/dL (ref 1.5–4.5)
Potassium: 4.5 mmol/L (ref 3.5–5.2)
Sodium: 138 mmol/L (ref 134–144)
Total Protein: 6.6 g/dL (ref 6.0–8.5)

## 2017-03-18 LAB — LIPID PANEL
CHOLESTEROL TOTAL: 216 mg/dL — AB (ref 100–199)
Chol/HDL Ratio: 4.6 ratio — ABNORMAL HIGH (ref 0.0–4.4)
HDL: 47 mg/dL (ref >39)
LDL CALC: 124 mg/dL — AB (ref 0–99)
TRIGLYCERIDES: 223 mg/dL — AB (ref 0–149)
VLDL CHOLESTEROL CAL: 45 mg/dL — AB (ref 5–40)

## 2017-03-18 NOTE — Telephone Encounter (Signed)
-----   Message from Mar Daring, [place] sent at 03/18/2017  9:41 AM EDT ----- Labs are fairly stable and WNL with exception of cholesterol that has increased from labs last year. I have that she is taking crestor 5mg  daily except on Thursday and Saturday. I would recommend for her to try to take daily and work on healthy dieting habits cutting back on carbs, red meats and fatty foods.

## 2017-03-18 NOTE — Telephone Encounter (Signed)
Patient advised as below. Patient reports that she will work on diet and exercise, pt reports that she has not been able to tolerate crestor in the past daily.

## 2017-04-06 ENCOUNTER — Ambulatory Visit
Admission: RE | Admit: 2017-04-06 | Discharge: 2017-04-06 | Disposition: A | Discharge status: Home / Self Care | Payer: Medicare Other | Source: Ambulatory Visit | Attending: Physician Assistant | Admitting: Physician Assistant

## 2017-04-06 DIAGNOSIS — Z1231 Encounter for screening mammogram for malignant neoplasm of breast: Secondary | ICD-10-CM | POA: Diagnosis present

## 2017-04-06 DIAGNOSIS — Z1239 Encounter for other screening for malignant neoplasm of breast: Secondary | ICD-10-CM

## 2017-04-07 ENCOUNTER — Telehealth: Payer: Self-pay

## 2017-04-07 NOTE — Telephone Encounter (Signed)
Patient advised.

## 2017-04-07 NOTE — Telephone Encounter (Signed)
-----   Message from Mar Daring, PA-C sent at 04/07/2017  8:19 AM EDT ----- Normal mammogram. Repeat screening in one year.

## 2017-06-11 ENCOUNTER — Other Ambulatory Visit: Payer: Self-pay | Admitting: Family Medicine

## 2017-06-11 DIAGNOSIS — K219 Gastro-esophageal reflux disease without esophagitis: Secondary | ICD-10-CM

## 2017-06-11 DIAGNOSIS — K449 Diaphragmatic hernia without obstruction or gangrene: Principal | ICD-10-CM

## 2017-07-16 ENCOUNTER — Other Ambulatory Visit: Payer: Self-pay | Admitting: Physician Assistant

## 2017-07-21 ENCOUNTER — Ambulatory Visit (INDEPENDENT_AMBULATORY_CARE_PROVIDER_SITE_OTHER): Payer: Medicare Other

## 2017-07-21 DIAGNOSIS — Z23 Encounter for immunization: Secondary | ICD-10-CM | POA: Diagnosis not present

## 2017-09-06 ENCOUNTER — Other Ambulatory Visit: Payer: Self-pay | Admitting: Family Medicine

## 2017-09-06 DIAGNOSIS — E7800 Pure hypercholesterolemia, unspecified: Secondary | ICD-10-CM

## 2017-09-16 ENCOUNTER — Other Ambulatory Visit: Payer: Self-pay

## 2017-09-16 ENCOUNTER — Ambulatory Visit (INDEPENDENT_AMBULATORY_CARE_PROVIDER_SITE_OTHER): Payer: Medicare Other | Admitting: Family Medicine

## 2017-09-16 ENCOUNTER — Encounter: Payer: Medicare Other | Admitting: Physician Assistant

## 2017-09-16 ENCOUNTER — Ambulatory Visit (INDEPENDENT_AMBULATORY_CARE_PROVIDER_SITE_OTHER): Payer: Medicare Other

## 2017-09-16 ENCOUNTER — Encounter: Payer: Self-pay | Admitting: Family Medicine

## 2017-09-16 VITALS — BP 122/68 | HR 68 | Temp 97.8°F | Resp 16 | Ht 58.5 in | Wt 125.8 lb

## 2017-09-16 VITALS — BP 140/88 | HR 68 | Temp 97.8°F | Ht 59.0 in | Wt 125.8 lb

## 2017-09-16 DIAGNOSIS — N182 Chronic kidney disease, stage 2 (mild): Secondary | ICD-10-CM

## 2017-09-16 DIAGNOSIS — M810 Age-related osteoporosis without current pathological fracture: Secondary | ICD-10-CM

## 2017-09-16 DIAGNOSIS — R739 Hyperglycemia, unspecified: Secondary | ICD-10-CM | POA: Diagnosis not present

## 2017-09-16 DIAGNOSIS — I130 Hypertensive heart and chronic kidney disease with heart failure and stage 1 through stage 4 chronic kidney disease, or unspecified chronic kidney disease: Secondary | ICD-10-CM | POA: Diagnosis not present

## 2017-09-16 DIAGNOSIS — I2510 Atherosclerotic heart disease of native coronary artery without angina pectoris: Secondary | ICD-10-CM

## 2017-09-16 DIAGNOSIS — E7800 Pure hypercholesterolemia, unspecified: Secondary | ICD-10-CM | POA: Diagnosis not present

## 2017-09-16 DIAGNOSIS — I10 Essential (primary) hypertension: Secondary | ICD-10-CM | POA: Diagnosis not present

## 2017-09-16 DIAGNOSIS — Z0000 Encounter for general adult medical examination without abnormal findings: Secondary | ICD-10-CM | POA: Diagnosis not present

## 2017-09-16 DIAGNOSIS — E559 Vitamin D deficiency, unspecified: Secondary | ICD-10-CM | POA: Diagnosis not present

## 2017-09-16 DIAGNOSIS — Z951 Presence of aortocoronary bypass graft: Secondary | ICD-10-CM

## 2017-09-16 NOTE — Patient Instructions (Signed)
Ms. Sheila Kim , Thank you for taking time to come for your Medicare Wellness Visit. I appreciate your ongoing commitment to your health goals. Please review the following plan we discussed and let me know if I can assist you in the future.   Screening recommendations/referrals: Colonoscopy: up to date Mammogram: up to date Bone Density: up to date  Recommended yearly ophthalmology/optometry visit for glaucoma screening and checkup Recommended yearly dental visit for hygiene and checkup  Vaccinations: Influenza vaccine: up to date Pneumococcal vaccine: up to date Tdap vaccine: up to date Shingles vaccine: last completed 10/01/11    Advanced directives: Please bring a copy of your POA (Power of Blanca) and/or Living Will to your next appointment.   Conditions/risks identified: Recommend cutting back and monitoring of dessert consumed in daily diet.  Next appointment: 2:00 PM today   Preventive Care 65 Years and Older, Female Preventive care refers to lifestyle choices and visits with your health care provider that can promote health and wellness. What does preventive care include?  A yearly physical exam. This is also called an annual well check.  Dental exams once or twice a year.  Routine eye exams. Ask your health care provider how often you should have your eyes checked.  Personal lifestyle choices, including:  Daily care of your teeth and gums.  Regular physical activity.  Eating a healthy diet.  Avoiding tobacco and drug use.  Limiting alcohol use.  Practicing safe sex.  Taking low-dose aspirin every day.  Taking vitamin and mineral supplements as recommended by your health care provider. What happens during an annual well check? The services and screenings done by your health care provider during your annual well check will depend on your age, overall health, lifestyle risk factors, and family history of disease. Counseling  Your health care provider may  ask you questions about your:  Alcohol use.  Tobacco use.  Drug use.  Emotional well-being.  Home and relationship well-being.  Sexual activity.  Eating habits.  History of falls.  Memory and ability to understand (cognition).  Work and work Statistician.  Reproductive health. Screening  You may have the following tests or measurements:  Height, weight, and BMI.  Blood pressure.  Lipid and cholesterol levels. These may be checked every 5 years, or more frequently if you are over 43 years old.  Skin check.  Lung cancer screening. You may have this screening every year starting at age 84 if you have a 30-pack-year history of smoking and currently smoke or have quit within the past 15 years.  Fecal occult blood test (FOBT) of the stool. You may have this test every year starting at age 27.  Flexible sigmoidoscopy or colonoscopy. You may have a sigmoidoscopy every 5 years or a colonoscopy every 10 years starting at age 75.  Hepatitis C blood test.  Hepatitis B blood test.  Sexually transmitted disease (STD) testing.  Diabetes screening. This is done by checking your blood sugar (glucose) after you have not eaten for a while (fasting). You may have this done every 1-3 years.  Bone density scan. This is done to screen for osteoporosis. You may have this done starting at age 27.  Mammogram. This may be done every 1-2 years. Talk to your health care provider about how often you should have regular mammograms. Talk with your health care provider about your test results, treatment options, and if necessary, the need for more tests. Vaccines  Your health care provider may recommend certain vaccines, such  as:  Influenza vaccine. This is recommended every year.  Tetanus, diphtheria, and acellular pertussis (Tdap, Td) vaccine. You may need a Td booster every 10 years.  Zoster vaccine. You may need this after age 14.  Pneumococcal 13-valent conjugate (PCV13) vaccine. One  dose is recommended after age 49.  Pneumococcal polysaccharide (PPSV23) vaccine. One dose is recommended after age 49. Talk to your health care provider about which screenings and vaccines you need and how often you need them. This information is not intended to replace advice given to you by your health care provider. Make sure you discuss any questions you have with your health care provider. Document Released: 10/24/2015 Document Revised: 06/16/2016 Document Reviewed: 07/29/2015 Elsevier Interactive Patient Education  2017 [place] Prevention in the Home Falls can cause injuries. They can happen to people of all ages. There are many things you can do to make your home safe and to help prevent falls. What can I do on the outside of my home?  Regularly fix the edges of walkways and driveways and fix any cracks.  Remove anything that might make you trip as you walk through a door, such as a raised step or threshold.  Trim any bushes or trees on the path to your home.  Use bright outdoor lighting.  Clear any walking paths of anything that might make someone trip, such as rocks or tools.  Regularly check to see if handrails are loose or broken. Make sure that both sides of any steps have handrails.  Any raised decks and porches should have guardrails on the edges.  Have any leaves, snow, or ice cleared regularly.  Use sand or salt on walking paths during winter.  Clean up any spills in your garage right away. This includes oil or grease spills. What can I do in the bathroom?  Use night lights.  Install grab bars by the toilet and in the tub and shower. Do not use towel bars as grab bars.  Use non-skid mats or decals in the tub or shower.  If you need to sit down in the shower, use a plastic, non-slip stool.  Keep the floor dry. Clean up any water that spills on the floor as soon as it happens.  Remove soap buildup in the tub or shower regularly.  Attach bath  mats securely with double-sided non-slip rug tape.  Do not have throw rugs and other things on the floor that can make you trip. What can I do in the bedroom?  Use night lights.  Make sure that you have a light by your bed that is easy to reach.  Do not use any sheets or blankets that are too big for your bed. They should not hang down onto the floor.  Have a firm chair that has side arms. You can use this for support while you get dressed.  Do not have throw rugs and other things on the floor that can make you trip. What can I do in the kitchen?  Clean up any spills right away.  Avoid walking on wet floors.  Keep items that you use a lot in easy-to-reach places.  If you need to reach something above you, use a strong step stool that has a grab bar.  Keep electrical cords out of the way.  Do not use floor polish or wax that makes floors slippery. If you must use wax, use non-skid floor wax.  Do not have throw rugs and other things on the  floor that can make you trip. What can I do with my stairs?  Do not leave any items on the stairs.  Make sure that there are handrails on both sides of the stairs and use them. Fix handrails that are broken or loose. Make sure that handrails are as long as the stairways.  Check any carpeting to make sure that it is firmly attached to the stairs. Fix any carpet that is loose or worn.  Avoid having throw rugs at the top or bottom of the stairs. If you do have throw rugs, attach them to the floor with carpet tape.  Make sure that you have a light switch at the top of the stairs and the bottom of the stairs. If you do not have them, ask someone to add them for you. What else can I do to help prevent falls?  Wear shoes that:  Do not have high heels.  Have rubber bottoms.  Are comfortable and fit you well.  Are closed at the toe. Do not wear sandals.  If you use a stepladder:  Make sure that it is fully opened. Do not climb a closed  stepladder.  Make sure that both sides of the stepladder are locked into place.  Ask someone to hold it for you, if possible.  Clearly mark and make sure that you can see:  Any grab bars or handrails.  First and last steps.  Where the edge of each step is.  Use tools that help you move around (mobility aids) if they are needed. These include:  Canes.  Walkers.  Scooters.  Crutches.  Turn on the lights when you go into a dark area. Replace any light bulbs as soon as they burn out.  Set up your furniture so you have a clear path. Avoid moving your furniture around.  If any of your floors are uneven, fix them.  If there are any pets around you, be aware of where they are.  Review your medicines with your doctor. Some medicines can make you feel dizzy. This can increase your chance of falling. Ask your doctor what other things that you can do to help prevent falls. This information is not intended to replace advice given to you by your health care provider. Make sure you discuss any questions you have with your health care provider. Document Released: 07/24/2009 Document Revised: 03/04/2016 Document Reviewed: 11/01/2014 Elsevier Interactive Patient Education  2017 [employer].

## 2017-09-16 NOTE — Assessment & Plan Note (Signed)
Recheck Vit D Continue supplement

## 2017-09-16 NOTE — Assessment & Plan Note (Signed)
Followed by cardiology Needs strong risk factor control as above

## 2017-09-16 NOTE — Assessment & Plan Note (Signed)
Followed by cardiology S/p CABG Needs strict BP and cholesterol control Recheck Lipid panel

## 2017-09-16 NOTE — Assessment & Plan Note (Signed)
Well controlled today Continue current meds Check CMP

## 2017-09-16 NOTE — Assessment & Plan Note (Signed)
Recheck A1c Discussed diet and exercise 

## 2017-09-16 NOTE — Progress Notes (Signed)
Patient: Sheila Kim, Female    DOB: 29-Aug-1941, 76 y.o.   MRN: [id number] Visit Date: 09/16/2017  Today's Provider: Lavon Paganini, MD   Chief Complaint  Patient presents with  . Annual Exam   Subjective:  I, Emily Ratchford, CMA, am acting as scribe for Lavon Paganini, MD.    Annual physical exam Sheila Kim is a 76 y.o. female who presents today for health maintenance and complete physical. She feels fairly well. Pt is c/o arthralgias. She reports exercising 6 days a week. Walks on the treadmill, chair exercises, stretches. She reports she is sleeping fairly well.  Last mammogram- 04/06/2017- BI-RADS 1 Last colonoscopy- 10/03/2015- diverticulosis. Non-bleeding internal hemorrhoids. Last BMD- 04/09/2009- osteoporosis. Pt states she was taking Evista for this, but was D/C after having open heart surgery.  ----------------------------------------------------------------- Pt is c/o arthralgias of the right knee and hands. The joints become red and warm to the touch. She also noticed "trigger thumb" of the right hand, which only occur in the mornings.  None of these are affecting QoL or ADLs.  She does not wish to see Ortho.  She notes some sinus problems. She is c/o some headache, right sided sinus pain/pressure, nasal congestion. This is an intermittent problem, and is exacerbated by certain weather.  Using claritin, flonase, netipot and saline rinse that helps  H/o STEMI s/p CABG:  Stable DOE.  No chest pain. Taking Crestor 5 days weekly (has found this is the sweet spot for avoiding myalgias.  Followed by Cardiology.  Getting lipid panel q6 months  Review of Systems  Constitutional: Negative.   HENT: Positive for postnasal drip, rhinorrhea, sinus pain and sneezing. Negative for congestion, dental problem, drooling, ear discharge, ear pain, facial swelling, hearing loss, mouth sores, nosebleeds, sinus pressure, sore throat, tinnitus, trouble  swallowing and voice change.   Eyes: Positive for itching. Negative for photophobia, pain, discharge, redness and visual disturbance.  Respiratory: Negative.   Cardiovascular: Positive for leg swelling. Negative for chest pain and palpitations.  Gastrointestinal: Negative.   Endocrine: Negative.   Genitourinary: Positive for enuresis. Negative for decreased urine volume, difficulty urinating, dyspareunia, dysuria, flank pain, frequency, genital sores, hematuria, menstrual problem, pelvic pain, urgency, vaginal bleeding, vaginal discharge and vaginal pain.  Musculoskeletal: Positive for arthralgias and back pain. Negative for gait problem, joint swelling, myalgias, neck pain and neck stiffness.  Skin: Negative.   Allergic/Immunologic: Positive for environmental allergies. Negative for food allergies and immunocompromised state.  Neurological: Negative.   Hematological: Negative.   Psychiatric/Behavioral: Negative for agitation, behavioral problems, confusion, decreased concentration, dysphoric mood, hallucinations, self-injury, sleep disturbance and suicidal ideas. The patient is nervous/anxious. The patient is not hyperactive.     Social History      She  reports that  has never smoked. she has never used smokeless tobacco. She reports that she does not drink alcohol or use drugs.       Social History   Socioeconomic History  . Marital status: Widowed    Spouse name: Hassell Done  . Number of children: 2  . Years of education: 71  . Highest education level: Some college, no degree  Social Needs  . Financial resource strain: Not hard at all  . Food insecurity - worry: Never true  . Food insecurity - inability: Never true  . Transportation needs - medical: No  . Transportation needs - non-medical: No  Occupational History  . Occupation: retired  Tobacco Use  . Smoking status: Never Smoker  .  Smokeless tobacco: Never Used  Substance and Sexual Activity  . Alcohol use: No  . Drug use: No   . Sexual activity: None  Other Topics Concern  . None  Social History Narrative  . None    Past Medical History:  Diagnosis Date  . Anemia    history  . Arthritis    hands, spine  . Chronic kidney disease    reports one kidney "doesn't work well"  . GERD (gastroesophageal reflux disease)   . Hypertension   . Motion sickness    boats  . Seasonal allergies      Patient Active Problem List   Diagnosis Date Noted  . Tinea corporis 09/17/2015  . Allergic rhinitis 02/12/2015  . Atherosclerosis of coronary artery 02/12/2015  . Chronic kidney disease (CKD), stage II (mild) 02/12/2015  . Colitis 02/12/2015  . DD (diverticular disease) 02/12/2015  . Gastroesophageal reflux disease with hiatal hernia 02/12/2015  . Personal history of methicillin resistant Staphylococcus aureus 02/12/2015  . H/O arthrodesis 02/12/2015  . Hypercholesteremia 02/12/2015  . Blood glucose elevated 02/12/2015  . BP (high blood pressure) 02/12/2015  . 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 Arthritis, degenerative 02/12/2015  . OP (osteoporosis) 02/12/2015  . Avitaminosis D 02/12/2015  . Myocardial infarction ([place]) 04/15/2014  . Pleural cavity effusion 06/08/2013  . Thrombocytopenia ([place]) 05/21/2013  . Disorder of right ventricle of heart 05/20/2013  . H/O coronary artery bypass surgery 05/17/2013  . History of knee surgery 05/16/2013  . Arteriosclerosis of coronary artery 05/14/2013    Past Surgical History:  Procedure Laterality Date  . ABDOMINAL HYSTERECTOMY    . BACK SURGERY    . BREAST CYST ASPIRATION Left   . COLONOSCOPY WITH PROPOFOL N/A 10/03/2015   Procedure: COLONOSCOPY WITH PROPOFOL;  Surgeon: Lucilla Lame, MD;  Location: [facility];  Service: Endoscopy;  Laterality: N/A;  . 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 DILATION AND CURETTAGE OF UTERUS    . EYE SURGERY Bilateral 2015   cataract  . NOSE SURGERY  1962    deviated septum, hemorrhaged- pt had to return to hospital  . [place]  . ureteropelvic junction obstruction      Family History        Family Status  Relation Name Status  . Mother  Deceased at age 46  . Father  Deceased at age 36  . Brother  Deceased at age 88  . Sister Tasia Catchings  . Sister Avis Deceased at age 44       multiple major organ failure  . Neg Hx  (Not Specified)        Her family history includes COPD in her father; Diabetes in her father, mother, and sister; Heart attack in her father and mother; Heart disease in her mother and sister; Stroke in her brother.     Allergies  Allergen Reactions  . Alcohol     Gastric pain  . Gelatin Capsule Empty  [Gelatin]     Stomach pains  . Gemfibrozil     Stomach pains, urine retention and muscle weakness.  . Meloxicam     Drastically alters taste  . Nsaids Other (See Comments)    Tolerates aspirin stomach pains and reflux  . Oxycodone Itching    Trembling  . Simvastatin Other (See Comments)    Feet and leg pain and general body weakness.  . Sulfa Antibiotics Nausea And Vomiting and Other (See  Comments)  . Clarithromycin Rash and Nausea And Vomiting    Terrible taste in mouth and stomach ache.  . Lopressor  [Metoprolol] Palpitations  . Niacin Other (See Comments) and Rash    Flushing  . Ofloxacin Rash and Nausea Only     Current Outpatient Medications:  .  acetaminophen (TYLENOL) 500 MG tablet, Take 650 mg by mouth daily. , Disp: , Rfl:  .  Alpha-D-Galactosidase (BEANO TO GO) TABS, Take by mouth. , Disp: , Rfl:  .  aspirin 81 MG tablet, Take 81 mg by mouth daily. , Disp: , Rfl:  .  CALCIUM CITRATE PO, Take 315 mg by mouth 3 (three) times daily. , Disp: , Rfl:  .  cholecalciferol (VITAMIN D) 1000 UNITS tablet, Take 1,000 Units by mouth daily. , Disp: , Rfl:  .  Coenzyme Q10 (COQ10) 100 MG CAPS, Take by mouth daily. , Disp: , Rfl:  .  cyanocobalamin 1000 MCG tablet, Take 1,000 mcg by mouth daily. ,  Disp: , Rfl:  .  ferrous sulfate 325 (65 FE) MG tablet, Take 325 mg by mouth daily with breakfast. , Disp: , Rfl:  .  fluticasone (FLONASE) 50 MCG/ACT nasal spray, Place 2 sprays into both nostrils daily., Disp: , Rfl:  .  folic acid (FOLVITE) 1 MG tablet, Take 1 mg by mouth daily. , Disp: , Rfl:  .  furosemide (LASIX) 40 MG tablet, TAKE 0.5 TABLETS (20 MG TOTAL) BY MOUTH 2 (TWO) TIMES DAILY., Disp: 90 tablet, Rfl: 1 .  ketotifen (ZADITOR) 0.025 % ophthalmic solution, Place 1 drop into both eyes as needed., Disp: , Rfl:  .  KLOR-CON M20 20 MEQ tablet, TAKE 1 TABLET TWICE A DAY (Patient taking differently: TAKE 1 TABLET ONCE A DAY), Disp: 180 tablet, Rfl: 1 .  lidocaine (LIDODERM) 5 %, LIDODERM, 5% (External Patch)  one to three Patch Patch Patch may apply up to 3 x 12 hours then off 12 hours for 0 days  Quantity: 30;  Refills: 5   Ordered :28-Sep-2013  [name] ;  Started 28-Sep-2013 Active, Disp: , Rfl:  .  loratadine (CLARITIN) 10 MG tablet, Take 10 mg by mouth daily. , Disp: , Rfl:  .  MELATONIN EXTRA STRENGTH PO, Take 0.25 tablets by mouth at bedtime as needed. , Disp: , Rfl:  .  Multiple Vitamins-Minerals ([employer] SPECTRAVITE ADULT 50+ PO), Education officer, community - Historical Medication  daily Active, Disp: , Rfl:  .  pantoprazole (PROTONIX) 40 MG tablet, TAKE 1 TABLET TWO TIMES DAILY, Disp: 180 tablet, Rfl: 3 .  Polyethyl Glycol-Propyl Glycol (SYSTANE) 0.4-0.3 % SOLN, Apply to eye. , Disp: , Rfl:  .  Probiotic Product ([employer] SENIOR PROBIOTIC) CAPS, Take by mouth daily. , Disp: , Rfl:  .  propranolol (INDERAL) 40 MG tablet, TAKE 1/2 TABLET BY MOUTH TWICE A DAY, Disp: 180 tablet, Rfl: 1 .  Psyllium Husk POWD, Take by mouth daily. , Disp: , Rfl:  .  pyridoxine (B-6) 100 MG tablet, Take 100 mg by mouth daily. , Disp: , Rfl:  .  rosuvastatin (CRESTOR) 5 MG tablet, TAKE 1 TABLET DAILY EXCEPT THURSDAY AND SUNDAY, Disp: 90 tablet, Rfl: 2   Patient Care Team: Mar Daring,  PA-C as PCP - General (Family Medicine) Dingeldein, Remo Lipps, MD as Consulting Physician (Ophthalmology) Isaias Cowman, MD as Consulting Physician (Cardiology) Abbie Sons, MD as Consulting Physician (Urology)      Objective:   Vitals: BP 122/68 (BP Location: Left Arm, Patient  Position: Sitting, Cuff Size: Normal)   Pulse 68   Temp 97.8 F (36.6 C)   Resp 16   Ht 4' 10.5" (1.486 m)   Wt 125 lb 12.8 oz (57.1 kg)   BMI 25.84 kg/m    Vitals:   09/16/17 1408  BP: 122/68  Pulse: 68  Resp: 16  Temp: 97.8 F (36.6 C)  Weight: 125 lb 12.8 oz (57.1 kg)  Height: 4' 10.5" (1.486 m)     Physical Exam  Constitutional: She is oriented to person, place, and time. She appears well-developed and well-nourished. No distress.  HENT:  Head: Normocephalic and atraumatic.  Right Ear: External ear normal.  Left Ear: External ear normal.  Nose: Nose normal.  Mouth/Throat: Oropharynx is clear and moist.  Eyes: Conjunctivae and EOM are normal. Pupils are equal, round, and reactive to light. No scleral icterus.  Neck: Neck supple. No thyromegaly present.  Cardiovascular: Normal rate, regular rhythm, normal heart sounds and intact distal pulses.  No murmur heard. Pulmonary/Chest: Effort normal and breath sounds normal. No respiratory distress. She has no wheezes. She has no rales.  Abdominal: Soft. Bowel sounds are normal. She exhibits no distension. There is no tenderness. There is no rebound and no guarding.  Musculoskeletal: She exhibits no edema or deformity.  Lymphadenopathy:    She has no cervical adenopathy.  Neurological: She is alert and oriented to person, place, and time.  Skin: Skin is warm and dry. No rash noted.  Psychiatric: She has a normal mood and affect. Her behavior is normal.  Vitals reviewed.    Depression Screen PHQ 2/9 Scores 09/16/2017 09/15/2016 03/18/2015  PHQ - 2 Score 0 0 0   Audit-C Alcohol Use Screening  Question Answer Points  How often do you  have alcoholic drink? never 0  On days you do drink alcohol, how many drinks do you typically consume? none 0  How oftey will you drink 6 or more in a total? never 0  Total Score:  0   A score of 3 or more in women, and 4 or more in men indicates increased risk for alcohol abuse, EXCEPT if all of the points are from question 1.     Assessment & Plan:     Routine Health Maintenance and Physical Exam  Exercise Activities and Dietary recommendations Goals    . DIET - REDUCE SUGAR INTAKE     Recommend cutting back and monitoring of dessert consumed in daily diet.       Immunization History  Administered Date(s) Administered  . Influenza, High Dose Seasonal PF 07/26/2015, 08/06/2016, 07/21/2017  . Pneumococcal Conjugate-13 07/16/2014  . Pneumococcal Polysaccharide-23 03/16/1999, 08/21/2005  . Tdap 07/01/2011  . Zoster 10/01/2011    Health Maintenance  Topic Date Due  . Samul Dada  06/30/2021  . INFLUENZA VACCINE  Completed  . DEXA SCAN  Completed  . PNA vac Low Risk Adult  Completed     Discussed health benefits of physical activity, and encouraged her to engage in regular exercise appropriate for her age and condition.    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[place])  Relevant Orders   Lipid panel     Musculoskeletal and Integument   OP (osteoporosis)    S/p partial treatment that needed to be stopped after MI and CABG Shared decision making with patient Agree to not recheck DEXA as this is unlikely to change management Discussed Ca/Vit D, regular weightbearing exercise         Genitourinary   Chronic kidney disease (CKD), stage II (mild)    Avoid NSAIDs, nephrotoxic meds Recheck CMP      Relevant Orders   Comprehensive metabolic panel     Other   Hypercholesteremia    Continue crestor 5 times weekly Recheck lipid panel and CMP      Relevant Orders   Lipid panel   Blood glucose elevated    Recheck A1c Discussed diet and exercise      Relevant Orders   Hemoglobin A1c   Avitaminosis D    Recheck Vit D Continue supplement      Relevant Orders   VITAMIN D 25 Hydroxy (Vit-D Deficiency, Fractures)   H/O coronary artery bypass surgery    Followed by cardiology Needs strong risk factor control as above       Other Visit Diagnoses    Encounter for annual physical exam    -  Primary   Relevant Orders   Lipid panel   CBC w/Diff/Platelet   Comprehensive metabolic panel      Return in about 6 months (around 03/17/2018) for chronic disease f/u.   The entirety of the information documented in the History of Present Illness, Review of Systems and Physical Exam were personally obtained by me. Portions of this information were initially documented by Raquel Sarna Ratchford, CMA and reviewed by me for thoroughness and accuracy.    Virginia Crews, MD, MPH [facility] 09/16/2017 3:21 PM

## 2017-09-16 NOTE — Progress Notes (Signed)
Subjective:   Sheila Kim is a 76 y.o. female who presents for Medicare Annual (Subsequent) preventive examination.  Review of Systems:   N/A  Cardiac Risk Factors include: advanced age (>37men, >26 women);dyslipidemia;hypertension     Objective:     Vitals: BP 140/88 (BP Location: Left Arm)   Pulse 68   Temp 97.8 F (36.6 C) (Oral)   Ht 4\' 11"  (1.499 m)   Wt 125 lb 12.8 oz (57.1 kg)   BMI 25.41 kg/m   Body mass index is 25.41 kg/m.  Advanced Directives 09/16/2017 09/15/2016 10/03/2015 09/17/2015 03/18/2015  Does Patient Have a Medical Advance Directive? Yes Yes Yes Yes Yes  Type of Paramedic of [place];Living will [place];Living will [place];Living will Living will;Healthcare Power of [place];Living will  Does patient want to make changes to medical advance directive? - - No - Patient declined - -  Copy of [place] in Chart? No - copy requested - No - copy requested - -    Tobacco Social History   Tobacco Use  Smoking Status Never Smoker  Smokeless Tobacco Never Used     Counseling given: Not Answered   Clinical Intake:  Pre-visit preparation completed: Yes  Pain : No/denies pain Pain Score: 0-No pain     Nutritional Status: BMI 25 -29 Overweight Nutritional Risks: None Diabetes: No  Activities of Daily Living: Independent Ambulation: Independent with device- listed below Home Assistive Devices/Equipment: Eyeglasses, Reacher Medication Administration: Independent Home Management: Independent  Barriers to Care Management & Learning: None  Do you feel unsafe in your current relationship?: No(widowed) Do you feel physically threatened by others?: No Anyone hurting you at home, work, or school?: No Unable to ask?: No Information provided on Community resources: No  How often do you need to have someone help you when you read  instructions, pamphlets, or other written materials from your doctor or pharmacy?: 1 - Never What is the last grade level you completed in school?: some college courses  Interpreter Needed?: No  Information entered by :: [facility], LPN  Past Medical History:  Diagnosis Date  . Anemia    history  . Arthritis    hands, spine  . Chronic kidney disease    reports one kidney "doesn't work well"  . GERD (gastroesophageal reflux disease)   . Hypertension   . Motion sickness    boats  . Seasonal allergies    Past Surgical History:  Procedure Laterality Date  . ABDOMINAL HYSTERECTOMY    . BACK SURGERY    . BREAST CYST ASPIRATION Left   . COLONOSCOPY WITH PROPOFOL N/A 10/03/2015   Procedure: COLONOSCOPY WITH PROPOFOL;  Surgeon: Lucilla Lame, MD;  Location: [facility];  Service: Endoscopy;  Laterality: N/A;  . 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 DILATION AND CURETTAGE OF UTERUS    . EYE SURGERY Bilateral 2015   cataract  . NOSE SURGERY  1962   deviated septum, hemorrhaged- pt had to return to hospital  . [place]  . ureteropelvic junction obstruction     Family History  Problem Relation Age of Onset  . Heart attack Mother        3 MI's  . Diabetes Mother   . Heart disease Mother   . Diabetes Father   . Heart attack Father   . COPD Father   . Stroke Brother   . Diabetes  Sister   . Heart disease Sister        2 stents  . Breast cancer Neg Hx    Social History   Socioeconomic History  . Marital status: Widowed    Spouse name: Hassell Done  . Number of children: 2  . Years of education: None  . Highest education level: None  Social Needs  . Financial resource strain: None  . Food insecurity - worry: None  . Food insecurity - inability: None  . Transportation needs - medical: None  . Transportation needs - non-medical: None  Occupational History  . Occupation: retired  Tobacco Use  . Smoking status: Never Smoker  . Smokeless  tobacco: Never Used  Substance and Sexual Activity  . Alcohol use: No  . Drug use: No  . Sexual activity: None  Other Topics Concern  . None  Social History Narrative  . None    Outpatient Encounter Medications as of 09/16/2017  Medication Sig  . acetaminophen (TYLENOL) 500 MG tablet Take 650 mg by mouth daily.   . Alpha-D-Galactosidase (BEANO TO GO) TABS Take by mouth.   Marland Kitchen aspirin 81 MG tablet Take 81 mg by mouth daily.   Marland Kitchen CALCIUM CITRATE PO Take 315 mg by mouth 3 (three) times daily.   . cholecalciferol (VITAMIN D) 1000 UNITS tablet Take 1,000 Units by mouth daily.   . Coenzyme Q10 (COQ10) 100 MG CAPS Take by mouth daily.   . cyanocobalamin 1000 MCG tablet Take 1,000 mcg by mouth daily.   . ferrous sulfate 325 (65 FE) MG tablet Take 325 mg by mouth daily with breakfast.   . fluticasone (FLONASE) 50 MCG/ACT nasal spray Place 2 sprays into both nostrils daily.  . folic acid (FOLVITE) 1 MG tablet Take 1 mg by mouth daily.   . furosemide (LASIX) 40 MG tablet TAKE 0.5 TABLETS (20 MG TOTAL) BY MOUTH 2 (TWO) TIMES DAILY.  Marland Kitchen ketotifen (ZADITOR) 0.025 % ophthalmic solution Place 1 drop into both eyes as needed.  Marland Kitchen KLOR-CON M20 20 MEQ tablet TAKE 1 TABLET TWICE A DAY (Patient taking differently: TAKE 1 TABLET ONCE A DAY)  . lidocaine (LIDODERM) 5 % LIDODERM, 5% (External Patch)  one to three Patch Patch Patch may apply up to 3 x 12 hours then off 12 hours for 0 days  Quantity: 30;  Refills: 5   Ordered :28-Sep-2013  [name] ;  Started 28-Sep-2013 Active  . loratadine (CLARITIN) 10 MG tablet Take 10 mg by mouth daily.   Marland Kitchen MELATONIN EXTRA STRENGTH PO Take 0.25 tablets by mouth at bedtime as needed.   . Multiple Vitamins-Minerals ([employer] SPECTRAVITE ADULT 50+ PO) Education officer, community - Historical Medication  daily Active  . pantoprazole (PROTONIX) 40 MG tablet TAKE 1 TABLET TWO TIMES DAILY  . Polyethyl Glycol-Propyl Glycol (SYSTANE) 0.4-0.3 % SOLN Apply to eye.   .  Probiotic Product ([employer]S SENIOR PROBIOTIC) CAPS Take by mouth daily.   . propranolol (INDERAL) 40 MG tablet TAKE 1/2 TABLET BY MOUTH TWICE A DAY  . Psyllium Husk POWD Take by mouth daily.   Marland Kitchen pyridoxine (B-6) 100 MG tablet Take 100 mg by mouth daily.   . rosuvastatin (CRESTOR) 5 MG tablet TAKE 1 TABLET DAILY EXCEPT THURSDAY AND SUNDAY   No facility-administered encounter medications on file as of 09/16/2017.     Activities of Daily Living In your present state of health, do you have any difficulty performing the following activities: 09/16/2017  Hearing? N  Vision? N  Difficulty concentrating or making decisions? N  Walking or climbing stairs? N  Dressing or bathing? N  Doing errands, shopping? N  Preparing Food and eating ? N  Using the Toilet? N  In the past six months, have you accidently leaked urine? Y  Comment occasionally  Do you have problems with loss of bowel control? N  Managing your Medications? N  Managing your Finances? N  Housekeeping or managing your Housekeeping? N  Some recent data might be hidden     Patient Care Team: Mar Daring, PA-C as PCP - General (Family Medicine) Dingeldein, Remo Lipps, MD as Consulting Physician (Ophthalmology) Isaias Cowman, MD as Consulting Physician (Cardiology) Abbie Sons, MD as Consulting Physician (Urology)    Assessment:     Exercise Activities and Dietary recommendations Current Exercise Habits: Home exercise routine, Type of exercise: walking;treadmill;strength training/weights;stretching, Time (Minutes): 40, Frequency (Times/Week): 6, Weekly Exercise (Minutes/Week): 240, Intensity: Mild, Exercise limited by: None identified  Goals    . DIET - REDUCE SUGAR INTAKE     Recommend cutting back and monitoring of dessert consumed in daily diet.      Fall Risk Fall Risk  09/16/2017 09/15/2016 03/18/2015  Falls in the past year? No No No   Is the patient's home free of loose throw rugs in walkways, pet beds,  electrical cords, etc?   yes      Grab bars in the bathroom? yes      Handrails on the stairs?   yes      Adequate lighting?   yes  Depression Screen PHQ 2/9 Scores 09/16/2017 09/15/2016 03/18/2015  PHQ - 2 Score 0 0 0     Cognitive Function: Pt declined screening today.        Immunization History  Administered Date(s) Administered  . Influenza, High Dose Seasonal PF 07/26/2015, 08/06/2016, 07/21/2017  . Pneumococcal Conjugate-13 07/16/2014  . Pneumococcal Polysaccharide-23 03/16/1999, 08/21/2005  . Tdap 07/01/2011  . Zoster 10/01/2011   Screening Tests Health Maintenance  Topic Date Due  . TETANUS/TDAP  06/30/2021  . INFLUENZA VACCINE  Completed  . DEXA SCAN  Completed  . PNA vac Low Risk Adult  Completed   Cancer Screenings: Lung: Low Dose CT Chest recommended if Age 65-80 years, 30 pack-year currently smoking OR have quit w/in 15years. Patient does not qualify. Breast: Up to date on Mammogram? Yes  Up to date of Bone Density/Dexa? Yes Colorectal: last completed 10/03/15  Additional Screenings:  Hepatitis B/HIV/Syphillis: Pt declined screening today. Hepatitis C Screening: Pt declined screening today.     Plan:  I have personally reviewed and addressed the Medicare Annual Wellness questionnaire and have noted the following in the patient's chart:  A. Medical and social history B. Use of alcohol, tobacco or illicit drugs  C. Current medications and supplements D. Functional ability and status E.  Nutritional status F.  Physical activity G. Advance directives H. List of other physicians I.  Hospitalizations, surgeries, and ER visits in previous 12 months J.  [place] such as hearing and vision if needed, cognitive and depression L. Referrals and appointments - none  In addition, I have reviewed and discussed with patient certain preventive protocols, quality metrics, and best practice recommendations. A written personalized care plan for preventive  services as well as general preventive health recommendations were provided to patient.  See attached scanned questionnaire for additional information.   Signed,  Fabio Neighbors, LPN Nurse Health Advisor   Nurse Recommendations: None.

## 2017-09-16 NOTE — Assessment & Plan Note (Signed)
Continue crestor 5 times weekly Recheck lipid panel and CMP

## 2017-09-16 NOTE — Assessment & Plan Note (Signed)
Avoid NSAIDs, nephrotoxic meds Recheck CMP

## 2017-09-16 NOTE — Patient Instructions (Signed)
Preventive Care 78 Years and Older, Female Preventive care refers to lifestyle choices and visits with your health care provider that can promote health and wellness. What does preventive care include?  A yearly physical exam. This is also called an annual well check.  Dental exams once or twice a year.  Routine eye exams. Ask your health care provider how often you should have your eyes checked.  Personal lifestyle choices, including: ? Daily care of your teeth and gums. ? Regular physical activity. ? Eating a healthy diet. ? Avoiding tobacco and drug use. ? Limiting alcohol use. ? Practicing safe sex. ? Taking low-dose aspirin every day. ? Taking vitamin and mineral supplements as recommended by your health care provider. What happens during an annual well check? The services and screenings done by your health care provider during your annual well check will depend on your age, overall health, lifestyle risk factors, and family history of disease. Counseling Your health care provider may ask you questions about your:  Alcohol use.  Tobacco use.  Drug use.  Emotional well-being.  Home and relationship well-being.  Sexual activity.  Eating habits.  History of falls.  Memory and ability to understand (cognition).  Work and work environment.  Reproductive health.  Screening You may have the following tests or measurements:  Height, weight, and BMI.  Blood pressure.  Lipid and cholesterol levels. These may be checked every 5 years, or more frequently if you are over 50 years old.  Skin check.  Lung cancer screening. You may have this screening every year starting at age 55 if you have a 30-pack-year history of smoking and currently smoke or have quit within the past 15 years.  Fecal occult blood test (FOBT) of the stool. You may have this test every year starting at age 50.  Flexible sigmoidoscopy or colonoscopy. You may have a sigmoidoscopy every 5 years or  a colonoscopy every 10 years starting at age 50.  Hepatitis C blood test.  Hepatitis B blood test.  Sexually transmitted disease (STD) testing.  Diabetes screening. This is done by checking your blood sugar (glucose) after you have not eaten for a while (fasting). You may have this done every 1-3 years.  Bone density scan. This is done to screen for osteoporosis. You may have this done starting at age 65.  Mammogram. This may be done every 1-2 years. Talk to your health care provider about how often you should have regular mammograms.  Talk with your health care provider about your test results, treatment options, and if necessary, the need for more tests. Vaccines Your health care provider may recommend certain vaccines, such as:  Influenza vaccine. This is recommended every year.  Tetanus, diphtheria, and acellular pertussis (Tdap, Td) vaccine. You may need a Td booster every 10 years.  Varicella vaccine. You may need this if you have not been vaccinated.  Zoster vaccine. You may need this after age 60.  Measles, mumps, and rubella (MMR) vaccine. You may need at least one dose of MMR if you were born in 1957 or later. You may also need a second dose.  Pneumococcal 13-valent conjugate (PCV13) vaccine. One dose is recommended after age 65.  Pneumococcal polysaccharide (PPSV23) vaccine. One dose is recommended after age 65.  Meningococcal vaccine. You may need this if you have certain conditions.  Hepatitis A vaccine. You may need this if you have certain conditions or if you travel or work in places where you may be exposed to hepatitis   A.  Hepatitis B vaccine. You may need this if you have certain conditions or if you travel or work in places where you may be exposed to hepatitis B.  Haemophilus influenzae type b (Hib) vaccine. You may need this if you have certain conditions.  Talk to your health care provider about which screenings and vaccines you need and how often you  need them. This information is not intended to replace advice given to you by your health care provider. Make sure you discuss any questions you have with your health care provider. Document Released: 10/24/2015 Document Revised: 06/16/2016 Document Reviewed: 07/29/2015 Elsevier Interactive Patient Education  2017 [employer].

## 2017-09-16 NOTE — Assessment & Plan Note (Signed)
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

## 2017-09-28 DIAGNOSIS — R739 Hyperglycemia, unspecified: Secondary | ICD-10-CM | POA: Diagnosis not present

## 2017-09-28 DIAGNOSIS — I130 Hypertensive heart and chronic kidney disease with heart failure and stage 1 through stage 4 chronic kidney disease, or unspecified chronic kidney disease: Secondary | ICD-10-CM | POA: Diagnosis not present

## 2017-09-28 DIAGNOSIS — N182 Chronic kidney disease, stage 2 (mild): Secondary | ICD-10-CM | POA: Diagnosis not present

## 2017-09-28 DIAGNOSIS — Z0000 Encounter for general adult medical examination without abnormal findings: Secondary | ICD-10-CM | POA: Diagnosis not present

## 2017-09-28 DIAGNOSIS — E7800 Pure hypercholesterolemia, unspecified: Secondary | ICD-10-CM | POA: Diagnosis not present

## 2017-09-28 DIAGNOSIS — E559 Vitamin D deficiency, unspecified: Secondary | ICD-10-CM | POA: Diagnosis not present

## 2017-09-29 ENCOUNTER — Telehealth: Payer: Self-pay

## 2017-09-29 LAB — COMPLETE METABOLIC PANEL WITH GFR
AG RATIO: 2 (calc) (ref 1.0–2.5)
ALT: 14 U/L (ref 6–29)
AST: 21 U/L (ref 10–35)
Albumin: 4.2 g/dL (ref 3.6–5.1)
Alkaline phosphatase (APISO): 51 U/L (ref 33–130)
BUN/Creatinine Ratio: 16 (calc) (ref 6–22)
BUN: 18 mg/dL (ref 7–25)
CALCIUM: 9.8 mg/dL (ref 8.6–10.4)
CO2: 30 mmol/L (ref 20–32)
CREATININE: 1.12 mg/dL — AB (ref 0.60–0.93)
Chloride: 102 mmol/L (ref 98–110)
GFR, EST AFRICAN AMERICAN: 54 mL/min/{1.73_m2} — AB (ref >=60)
GFR, EST NON AFRICAN AMERICAN: 47 mL/min/{1.73_m2} — AB (ref >=60)
Globulin: 2.1 g/dL (calc) (ref 1.9–3.7)
Glucose, Bld: 116 mg/dL — ABNORMAL HIGH (ref 65–99)
POTASSIUM: 4.2 mmol/L (ref 3.5–5.3)
Sodium: 139 mmol/L (ref 135–146)
TOTAL PROTEIN: 6.3 g/dL (ref 6.1–8.1)
Total Bilirubin: 0.7 mg/dL (ref 0.2–1.2)

## 2017-09-29 LAB — CBC WITH DIFFERENTIAL/PLATELET
BASOS PCT: 1.6 %
Basophils Absolute: 118 cells/uL (ref 0–200)
Eosinophils Absolute: 540 cells/uL — ABNORMAL HIGH (ref 15–500)
Eosinophils Relative: 7.3 %
HCT: 41.5 % (ref 35.0–45.0)
Hemoglobin: 14.2 g/dL (ref 11.7–15.5)
LYMPHS ABS: 2227 {cells}/uL (ref 850–3900)
MCH: 32.5 pg (ref 27.0–33.0)
MCHC: 34.2 g/dL (ref 32.0–36.0)
MCV: 95 fL (ref 80.0–100.0)
MPV: 10.2 fL (ref 7.5–12.5)
Monocytes Relative: 11.6 %
Neutro Abs: 3656 cells/uL (ref 1500–7800)
Neutrophils Relative %: 49.4 %
PLATELETS: 302 10*3/uL (ref 140–400)
RBC: 4.37 10*6/uL (ref 3.80–5.10)
RDW: 11.8 % (ref 11.0–15.0)
TOTAL LYMPHOCYTE: 30.1 %
WBC: 7.4 10*3/uL (ref 3.8–10.8)
WBCMIX: 858 {cells}/uL (ref 200–950)

## 2017-09-29 LAB — LIPID PANEL
Cholesterol: 205 mg/dL — ABNORMAL HIGH (ref <200)
HDL: 49 mg/dL — ABNORMAL LOW (ref >50)
LDL Cholesterol (Calc): 125 mg/dL (calc) — ABNORMAL HIGH
Non-HDL Cholesterol (Calc): 156 mg/dL (calc) — ABNORMAL HIGH (ref <130)
Total CHOL/HDL Ratio: 4.2 (calc) (ref <5.0)
Triglycerides: 194 mg/dL — ABNORMAL HIGH (ref <150)

## 2017-09-29 LAB — HEMOGLOBIN A1C
EAG (MMOL/L): 6.8 (calc)
HEMOGLOBIN A1C: 5.9 %{Hb} — AB (ref <5.7)
Mean Plasma Glucose: 123 (calc)

## 2017-09-29 LAB — VITAMIN D 25 HYDROXY (VIT D DEFICIENCY, FRACTURES): Vit D, 25-Hydroxy: 63 ng/mL (ref 30–100)

## 2017-09-29 NOTE — Telephone Encounter (Signed)
Pt advised and agrees with treatment plan. 

## 2017-09-29 NOTE — Telephone Encounter (Signed)
-----   Message from Virginia Crews, MD sent at 09/29/2017  8:20 AM EST ----- Cholesterol, kidney function are stable.  Normal Vit D, blood counts, liver function, and electrolytes.  A1c has increased and is in prediabetic range.  Cut back on carbohydrate intake to improve this.  Virginia Crews, MD, MPH [facility] 09/29/2017 8:20 AM

## 2017-11-02 DIAGNOSIS — I2510 Atherosclerotic heart disease of native coronary artery without angina pectoris: Secondary | ICD-10-CM | POA: Diagnosis not present

## 2017-11-02 DIAGNOSIS — E785 Hyperlipidemia, unspecified: Secondary | ICD-10-CM | POA: Diagnosis not present

## 2017-11-02 DIAGNOSIS — Z951 Presence of aortocoronary bypass graft: Secondary | ICD-10-CM | POA: Diagnosis not present

## 2017-11-02 DIAGNOSIS — I21A1 Myocardial infarction type 2: Secondary | ICD-10-CM | POA: Diagnosis not present

## 2017-11-02 DIAGNOSIS — I10 Essential (primary) hypertension: Secondary | ICD-10-CM | POA: Diagnosis not present

## 2018-02-06 ENCOUNTER — Other Ambulatory Visit: Payer: Self-pay | Admitting: Physician Assistant

## 2018-02-20 ENCOUNTER — Other Ambulatory Visit: Payer: Self-pay | Admitting: Physician Assistant

## 2018-02-20 DIAGNOSIS — I10 Essential (primary) hypertension: Secondary | ICD-10-CM

## 2018-03-17 ENCOUNTER — Ambulatory Visit: Payer: Medicare Other | Admitting: Family Medicine

## 2018-03-17 ENCOUNTER — Encounter: Payer: Self-pay | Admitting: Family Medicine

## 2018-03-17 VITALS — BP 142/82 | HR 60 | Temp 98.2°F | Resp 16 | Wt 126.0 lb

## 2018-03-17 DIAGNOSIS — R739 Hyperglycemia, unspecified: Secondary | ICD-10-CM | POA: Diagnosis not present

## 2018-03-17 DIAGNOSIS — I10 Essential (primary) hypertension: Secondary | ICD-10-CM | POA: Diagnosis not present

## 2018-03-17 DIAGNOSIS — J029 Acute pharyngitis, unspecified: Secondary | ICD-10-CM

## 2018-03-17 DIAGNOSIS — N182 Chronic kidney disease, stage 2 (mild): Secondary | ICD-10-CM | POA: Diagnosis not present

## 2018-03-17 DIAGNOSIS — E7800 Pure hypercholesterolemia, unspecified: Secondary | ICD-10-CM | POA: Diagnosis not present

## 2018-03-17 LAB — POCT GLYCOSYLATED HEMOGLOBIN (HGB A1C)
ESTIMATED AVERAGE GLUCOSE: 117
HEMOGLOBIN A1C: 5.7 % — AB (ref 4.0–5.6)

## 2018-03-17 NOTE — Patient Instructions (Signed)

## 2018-03-17 NOTE — Assessment & Plan Note (Signed)
Previously stable Avoid NSAIDs and nephrotoxic meds Recheck CMP

## 2018-03-17 NOTE — Assessment & Plan Note (Signed)
Improved with A1c decreased from 5.9 to 5.7 Not on any medications Continue exercise and low carb diet Recheck A1c at CPE in 6 months

## 2018-03-17 NOTE — Assessment & Plan Note (Signed)
Initially slightly elevated This is a long-standing issue related to whitecoat hypertension Well-controlled at home Continue current meds Check CMP

## 2018-03-17 NOTE — Assessment & Plan Note (Signed)
Continue Crestor 5 times weekly Recheck lipid panel and CMP when fasting Previously failed Zocor, simvastatin, niacin, Zetia, gemfibrozil, and Lipitor secondary to leg cramps and other side effects Has follow-up appointment next month with cardiology

## 2018-03-17 NOTE — Progress Notes (Signed)
Patient: Sheila Kim Female    DOB: 1940/12/02   77 y.o.   MRN: [id number] Visit Date: 03/17/2018  Today's Provider: Lavon Paganini, MD   I, Martha Clan, CMA, am acting as scribe for Lavon Paganini, MD.  Chief Complaint  Patient presents with  . Hyperglycemia  . Hypertension   Subjective:    HPI      Hyperglycemia, Follow-up:   Lab Results  Component Value Date   HGBA1C 5.7 (A) 03/17/2018   HGBA1C 5.9 (H) 09/28/2017   HGBA1C 5.6 03/16/2017   GLUCOSE 116 (H) 09/28/2017   GLUCOSE 103 (H) 03/17/2017   GLUCOSE 100 (H) 09/16/2016    Last seen for for this 6 months ago.  Management since then includes encouraging diet and exercise. Current symptoms include none and have been stable.  Weight trend: stable Current diet: in general, a "healthy" diet  , vegetarian Current exercise: treadmill 6 x per week, barbells 3 x/ week, chair exercises  Pertinent Labs:    Component Value Date/Time   CHOL 205 (H) 09/28/2017 0840   CHOL 216 (H) 03/17/2017 0846   TRIG 194 (H) 09/28/2017 0840   CHOLHDL 4.2 09/28/2017 0840   CREATININE 1.12 (H) 09/28/2017 0840    Wt Readings from Last 3 Encounters:  03/17/18 126 lb (57.2 kg)  09/16/17 125 lb 12.8 oz (57.1 kg)  09/16/17 125 lb 12.8 oz (57.1 kg)    Hypertension, follow-up:  BP Readings from Last 3 Encounters:  03/17/18 (!) 142/82  09/16/17 122/68  09/16/17 140/88    She was last seen for hypertension 6 months ago.  BP at that visit was 122/68. Management since that visit includes continuing propanolol and lasix. She reports good compliance with treatment. She is not having side effects.  She is exercising. She is adherent to low salt diet.   Outside blood pressurens are normal per pt; states she has white coat syndrome.  She is experiencing lower extremity edema.  Patient denies chest pain, chest pressure/discomfort, claudication, dyspnea, exertional chest pressure/discomfort, fatigue, irregular  heart beat, near-syncope, orthopnea, palpitations and syncope.   Cardiovascular risk factors include advanced age (older than 9 for men, 82 for women), dyslipidemia and hypertension.  Use of agents associated with hypertension: none.   ------------------------------------------------------------------------ HLD - medications: Crestor 5 days per week, garlique - compliance: good - medication SEs: none   Sore throat: X2 days, stable, constant, +postnasal drip, sneezing, rhinorrhea. No fevers.  Allergies  Allergen Reactions  . Alcohol     Gastric pain  . Gelatin Capsule Empty  [Gelatin]     Stomach pains  . Gemfibrozil     Stomach pains, urine retention and muscle weakness.  . Meloxicam     Drastically alters taste  . Nsaids Other (See Comments)    Tolerates aspirin stomach pains and reflux  . Oxycodone Itching    Trembling  . Simvastatin Other (See Comments)    Feet and leg pain and general body weakness.  . Sulfa Antibiotics Nausea And Vomiting and Other (See Comments)  . Clarithromycin Rash and Nausea And Vomiting    Terrible taste in mouth and stomach ache.  . Lopressor  [Metoprolol] Palpitations  . Niacin Other (See Comments) and Rash    Flushing  . Ofloxacin Rash and Nausea Only     Current Outpatient Medications:  .  acetaminophen (TYLENOL) 500 MG tablet, Take 650 mg by mouth daily. , Disp: , Rfl:  .  Alpha-D-Galactosidase (BEANO TO GO) TABS,  Take by mouth. , Disp: , Rfl:  .  aspirin 81 MG tablet, Take 81 mg by mouth daily. , Disp: , Rfl:  .  CALCIUM CITRATE PO, Take 315 mg by mouth 3 (three) times daily. , Disp: , Rfl:  .  cholecalciferol (VITAMIN D) 1000 UNITS tablet, Take 1,000 Units by mouth daily. , Disp: , Rfl:  .  Coenzyme Q10 (COQ10) 100 MG CAPS, Take by mouth daily. , Disp: , Rfl:  .  cyanocobalamin 1000 MCG tablet, Take 1,000 mcg by mouth daily. , Disp: , Rfl:  .  ferrous sulfate 325 (65 FE) MG tablet, Take 325 mg by mouth daily with breakfast. , Disp: ,  Rfl:  .  fluticasone (FLONASE) 50 MCG/ACT nasal spray, Place 2 sprays into both nostrils daily., Disp: , Rfl:  .  folic acid (FOLVITE) 1 MG tablet, Take 1 mg by mouth daily. , Disp: , Rfl:  .  furosemide (LASIX) 40 MG tablet, TAKE 0.5 TABLETS (20 MG TOTAL) BY MOUTH 2 (TWO) TIMES DAILY., Disp: 90 tablet, Rfl: 1 .  Garlic (GARLIQUE PO), Take by mouth., Disp: , Rfl:  .  ketotifen (ZADITOR) 0.025 % ophthalmic solution, Place 1 drop into both eyes as needed., Disp: , Rfl:  .  KLOR-CON M20 20 MEQ tablet, TAKE 1 TABLET TWICE A DAY, Disp: 180 tablet, Rfl: 1 .  loratadine (CLARITIN) 10 MG tablet, Take 10 mg by mouth daily. , Disp: , Rfl:  .  MELATONIN EXTRA STRENGTH PO, Take 0.25 tablets by mouth at bedtime as needed. , Disp: , Rfl:  .  Multiple Vitamins-Minerals ([employer] SPECTRAVITE ADULT 50+ PO), Education officer, community - Historical Medication  daily Active, Disp: , Rfl:  .  pantoprazole (PROTONIX) 40 MG tablet, TAKE 1 TABLET TWO TIMES DAILY, Disp: 180 tablet, Rfl: 3 .  Polyethyl Glycol-Propyl Glycol (SYSTANE) 0.4-0.3 % SOLN, Apply to eye. , Disp: , Rfl:  .  Probiotic Product ([employer] SENIOR PROBIOTIC) CAPS, Take by mouth daily. , Disp: , Rfl:  .  propranolol (INDERAL) 40 MG tablet, TAKE 1/2 TABLET BY MOUTH TWICE A DAY, Disp: 180 tablet, Rfl: 1 .  Psyllium Husk POWD, Take by mouth daily. , Disp: , Rfl:  .  pyridoxine (B-6) 100 MG tablet, Take 100 mg by mouth daily. , Disp: , Rfl:  .  rosuvastatin (CRESTOR) 5 MG tablet, TAKE 1 TABLET DAILY EXCEPT THURSDAY AND SUNDAY, Disp: 90 tablet, Rfl: 2 .  lidocaine (LIDODERM) 5 %, LIDODERM, 5% (External Patch)  one to three Patch Patch Patch may apply up to 3 x 12 hours then off 12 hours for 0 days  Quantity: 30;  Refills: 5   Ordered :28-Sep-2013  [name] ;  Started 28-Sep-2013 Active, Disp: , Rfl:   Review of Systems  Constitutional: Negative for activity change, appetite change, chills, diaphoresis, fatigue, fever and unexpected weight change.    HENT: Positive for sneezing and sore throat.   Eyes: Positive for itching.  Respiratory: Negative for cough and shortness of breath.   Cardiovascular: Positive for leg swelling. Negative for chest pain and palpitations.  Endocrine: Negative for polydipsia, polyphagia and polyuria.    Social History   Tobacco Use  . Smoking status: Never Smoker  . Smokeless tobacco: Never Used  Substance Use Topics  . Alcohol use: No   Objective:   BP (!) 142/82 (BP Location: Left Arm, Cuff Size: Normal)   Pulse 60   Temp 98.2 F (36.8 C) (Oral)   Resp 16  Wt 126 lb (57.2 kg)   SpO2 99%   BMI 25.89 kg/m  Vitals:   03/17/18 1542 03/17/18 1620  BP: (!) 142/94 (!) 142/82  Pulse: 60   Resp: 16   Temp: 98.2 F (36.8 C)   TempSrc: Oral   SpO2: 99%   Weight: 126 lb (57.2 kg)      Physical Exam  Constitutional: She is oriented to person, place, and time. She appears well-developed and well-nourished. No distress.  HENT:  Head: Normocephalic and atraumatic.  Right Ear: Tympanic membrane, external ear and ear canal normal.  Left Ear: Tympanic membrane, external ear and ear canal normal.  Nose: Mucosal edema and rhinorrhea present. Right sinus exhibits no maxillary sinus tenderness and no frontal sinus tenderness. Left sinus exhibits no maxillary sinus tenderness and no frontal sinus tenderness.  Mouth/Throat: Uvula is midline and mucous membranes are normal. Posterior oropharyngeal erythema present. No oropharyngeal exudate or posterior oropharyngeal edema.  Eyes: Pupils are equal, round, and reactive to light. Conjunctivae are normal. Right eye exhibits no discharge. Left eye exhibits no discharge. No scleral icterus.  Neck: Neck supple. No thyromegaly present.  Cardiovascular: Normal rate, regular rhythm, normal heart sounds and intact distal pulses.  No murmur heard. Pulmonary/Chest: Effort normal and breath sounds normal. No respiratory distress. She has no wheezes. She has no rales.   Musculoskeletal: She exhibits no edema.  Lymphadenopathy:    She has no cervical adenopathy.  Neurological: She is alert and oriented to person, place, and time.  Skin: Skin is warm and dry. Capillary refill takes less than 2 seconds. No rash noted.  Psychiatric: She has a normal mood and affect. Her behavior is normal.  Vitals reviewed.   Results for orders placed or performed in visit on 03/17/18  POCT glycosylated hemoglobin (Hb A1C)  Result Value Ref Range   Hemoglobin A1C 5.7 (A) 4.0 - 5.6 %   Est. 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 Well-controlled at home Continue current meds Check CMP      Relevant Orders   Comprehensive metabolic panel     Genitourinary   Chronic kidney disease (CKD), stage II (mild)    Previously stable Avoid NSAIDs and nephrotoxic meds Recheck CMP      Relevant Orders   Comprehensive metabolic panel     Other   Hypercholesteremia    Continue Crestor 5 times weekly Recheck lipid panel and CMP when fasting Previously failed Zocor, simvastatin, niacin, Zetia, gemfibrozil, and Lipitor secondary to leg cramps and other side effects Has follow-up appointment next month with cardiology      Relevant Orders   Comprehensive metabolic panel   Lipid panel   Blood glucose elevated    Improved with A1c decreased from 5.9 to 5.7 Not on any medications Continue exercise and low carb diet Recheck A1c at CPE in 6 months      Relevant Orders   POCT glycosylated hemoglobin (Hb A1C) (Completed)    Other Visit Diagnoses    Sore throat        -Most likely related to allergic rhinitis and postnasal drip - continue claritin and flonase -Discussed natural course, symptomatic management, and return  precautions   Return in about  6 months (around 09/16/2018) for CPE/AWV.   The entirety of the information documented in the History of Present Illness, Review of Systems and Physical Exam were personally obtained by me. Portions of this information were initially documented by Raquel Sarna Ratchford, CMA and reviewed by me for thoroughness and accuracy.    Virginia Crews, MD, MPH [facility] 03/17/2018 4:36 PM

## 2018-03-20 ENCOUNTER — Other Ambulatory Visit: Payer: Self-pay | Admitting: Family Medicine

## 2018-03-20 DIAGNOSIS — Z1231 Encounter for screening mammogram for malignant neoplasm of breast: Secondary | ICD-10-CM

## 2018-03-21 DIAGNOSIS — I10 Essential (primary) hypertension: Secondary | ICD-10-CM | POA: Diagnosis not present

## 2018-03-21 DIAGNOSIS — N182 Chronic kidney disease, stage 2 (mild): Secondary | ICD-10-CM | POA: Diagnosis not present

## 2018-03-21 DIAGNOSIS — E7800 Pure hypercholesterolemia, unspecified: Secondary | ICD-10-CM | POA: Diagnosis not present

## 2018-03-22 ENCOUNTER — Telehealth: Payer: Self-pay

## 2018-03-22 LAB — COMPREHENSIVE METABOLIC PANEL
ALT: 16 IU/L (ref 0–32)
AST: 20 IU/L (ref 0–40)
Albumin/Globulin Ratio: 2.3 — ABNORMAL HIGH (ref 1.2–2.2)
Albumin: 4.5 g/dL (ref 3.5–4.8)
Alkaline Phosphatase: 58 IU/L (ref 39–117)
BUN/Creatinine Ratio: 13 (ref 12–28)
BUN: 14 mg/dL (ref 8–27)
Bilirubin Total: 0.8 mg/dL (ref 0.0–1.2)
CALCIUM: 9.9 mg/dL (ref 8.7–10.3)
CO2: 26 mmol/L (ref 20–29)
CREATININE: 1.05 mg/dL — AB (ref 0.57–1.00)
Chloride: 99 mmol/L (ref 96–106)
GFR, EST AFRICAN AMERICAN: 58 mL/min/{1.73_m2} — AB (ref >59)
GFR, EST NON AFRICAN AMERICAN: 51 mL/min/{1.73_m2} — AB (ref >59)
Globulin, Total: 2 g/dL (ref 1.5–4.5)
Glucose: 99 mg/dL (ref 65–99)
Potassium: 4.7 mmol/L (ref 3.5–5.2)
Sodium: 141 mmol/L (ref 134–144)
TOTAL PROTEIN: 6.5 g/dL (ref 6.0–8.5)

## 2018-03-22 LAB — LIPID PANEL
CHOL/HDL RATIO: 4.3 ratio (ref 0.0–4.4)
Cholesterol, Total: 209 mg/dL — ABNORMAL HIGH (ref 100–199)
HDL: 49 mg/dL (ref >39)
LDL Calculated: 139 mg/dL — ABNORMAL HIGH (ref 0–99)
TRIGLYCERIDES: 104 mg/dL (ref 0–149)
VLDL CHOLESTEROL CAL: 21 mg/dL (ref 5–40)

## 2018-03-22 NOTE — Telephone Encounter (Signed)
-----   Message from Virginia Crews, MD sent at 03/22/2018  8:12 AM EDT ----- Stable kidney function.  Normal electrolytes and liver function.  Cholesterol is very slightly worse than it was last year.  May want to consider adding an additional day of taking Crestor per week if able to tolerate.  If not, then fine to continue as is.  Virginia Crews, MD, MPH [facility] 03/22/2018 8:11 AM

## 2018-03-22 NOTE — Telephone Encounter (Signed)
Pt advised. Agrees to try to increase Crestor.

## 2018-04-12 ENCOUNTER — Ambulatory Visit
Admission: RE | Admit: 2018-04-12 | Discharge: 2018-04-12 | Disposition: A | Discharge status: Home / Self Care | Payer: Medicare Other | Source: Ambulatory Visit | Attending: Family Medicine | Admitting: Family Medicine

## 2018-04-12 DIAGNOSIS — Z1231 Encounter for screening mammogram for malignant neoplasm of breast: Secondary | ICD-10-CM | POA: Diagnosis not present

## 2018-04-18 ENCOUNTER — Telehealth: Payer: Self-pay

## 2018-04-18 NOTE — Telephone Encounter (Signed)
Viewed by Normand Sloop Coggeshall on 04/17/2018 8:50 AM

## 2018-04-18 NOTE — Telephone Encounter (Signed)
-----   Message from Virginia Crews, MD sent at 04/17/2018  8:39 AM EDT ----- Normal mammogram.  Repeat in 1 yr.  Virginia Crews, MD, MPH [facility] 04/17/2018 8:39 AM

## 2018-05-03 DIAGNOSIS — I213 ST elevation (STEMI) myocardial infarction of unspecified site: Secondary | ICD-10-CM | POA: Diagnosis not present

## 2018-05-03 DIAGNOSIS — I2510 Atherosclerotic heart disease of native coronary artery without angina pectoris: Secondary | ICD-10-CM | POA: Diagnosis not present

## 2018-05-03 DIAGNOSIS — Z951 Presence of aortocoronary bypass graft: Secondary | ICD-10-CM | POA: Diagnosis not present

## 2018-05-03 DIAGNOSIS — I21A1 Myocardial infarction type 2: Secondary | ICD-10-CM | POA: Diagnosis not present

## 2018-05-15 ENCOUNTER — Other Ambulatory Visit: Payer: Self-pay | Admitting: Physician Assistant

## 2018-05-15 DIAGNOSIS — K219 Gastro-esophageal reflux disease without esophagitis: Secondary | ICD-10-CM

## 2018-05-15 DIAGNOSIS — K449 Diaphragmatic hernia without obstruction or gangrene: Principal | ICD-10-CM

## 2018-06-15 DIAGNOSIS — E119 Type 2 diabetes mellitus without complications: Secondary | ICD-10-CM | POA: Diagnosis not present

## 2018-06-15 LAB — HM DIABETES EYE EXAM

## 2018-07-27 ENCOUNTER — Other Ambulatory Visit: Payer: Self-pay

## 2018-07-27 NOTE — Patient Outreach (Signed)
[facility]) Care Management  07/27/2018  BLANCH STANG Mar 06, 1941 [id number]    Medication Adherence call to Mrs. Analyce Tavares left a message for patient to call back patient is showing past due on Rosuvastatin 5 mg. Mrs. Mcguiness is showing past due under [place].   [facility] Direct Dial [phone]  Fax [phone] [email or web address]

## 2018-08-03 ENCOUNTER — Ambulatory Visit (INDEPENDENT_AMBULATORY_CARE_PROVIDER_SITE_OTHER): Payer: Medicare Other

## 2018-08-03 ENCOUNTER — Other Ambulatory Visit: Payer: Self-pay | Admitting: Physician Assistant

## 2018-08-03 DIAGNOSIS — Z23 Encounter for immunization: Secondary | ICD-10-CM | POA: Diagnosis not present

## 2018-08-03 DIAGNOSIS — E7800 Pure hypercholesterolemia, unspecified: Secondary | ICD-10-CM

## 2018-09-20 ENCOUNTER — Ambulatory Visit (INDEPENDENT_AMBULATORY_CARE_PROVIDER_SITE_OTHER): Payer: Medicare Other | Admitting: Family Medicine

## 2018-09-20 ENCOUNTER — Ambulatory Visit (INDEPENDENT_AMBULATORY_CARE_PROVIDER_SITE_OTHER): Payer: Medicare Other

## 2018-09-20 VITALS — BP 144/86 | HR 49 | Temp 97.7°F | Ht 59.0 in | Wt 123.2 lb

## 2018-09-20 DIAGNOSIS — K219 Gastro-esophageal reflux disease without esophagitis: Secondary | ICD-10-CM | POA: Diagnosis not present

## 2018-09-20 DIAGNOSIS — Z0000 Encounter for general adult medical examination without abnormal findings: Secondary | ICD-10-CM

## 2018-09-20 DIAGNOSIS — K449 Diaphragmatic hernia without obstruction or gangrene: Secondary | ICD-10-CM

## 2018-09-20 DIAGNOSIS — E7800 Pure hypercholesterolemia, unspecified: Secondary | ICD-10-CM

## 2018-09-20 DIAGNOSIS — I10 Essential (primary) hypertension: Secondary | ICD-10-CM | POA: Diagnosis not present

## 2018-09-20 DIAGNOSIS — H6981 Other specified disorders of Eustachian tube, right ear: Secondary | ICD-10-CM

## 2018-09-20 DIAGNOSIS — R739 Hyperglycemia, unspecified: Secondary | ICD-10-CM | POA: Diagnosis not present

## 2018-09-20 DIAGNOSIS — N182 Chronic kidney disease, stage 2 (mild): Secondary | ICD-10-CM

## 2018-09-20 DIAGNOSIS — Z951 Presence of aortocoronary bypass graft: Secondary | ICD-10-CM

## 2018-09-20 DIAGNOSIS — I130 Hypertensive heart and chronic kidney disease with heart failure and stage 1 through stage 4 chronic kidney disease, or unspecified chronic kidney disease: Secondary | ICD-10-CM

## 2018-09-20 MED ORDER — POTASSIUM CHLORIDE CRYS ER 20 MEQ PO TBCR: 20.0000 meq | EXTENDED_RELEASE_TABLET | Freq: Every day | ORAL | 3 refills | Status: DC

## 2018-09-20 NOTE — Assessment & Plan Note (Signed)
Slightly elevated, but well controlled at home Long-standing white coat hypertension Continue current meds Check CMP

## 2018-09-20 NOTE — Patient Instructions (Signed)
Sheila Kim , Thank you for taking time to come for your Medicare Wellness Visit. I appreciate your ongoing commitment to your health goals. Please review the following plan we discussed and let me know if I can assist you in the future.   Screening recommendations/referrals: Colonoscopy: No longer required.  Mammogram: No longer required.  Bone Density: Pt declines order today.  Recommended yearly ophthalmology/optometry visit for glaucoma screening and checkup Recommended yearly dental visit for hygiene and checkup  Vaccinations: Influenza vaccine: Up to date Pneumococcal vaccine: Completed series Tdap vaccine: Up to date, due 06/2021 Shingles vaccine: Pt declines today.     Advanced directives: Already on file.   Conditions/risks identified: Monitor sugar intake and avoid eating daily sweets.   Next appointment: 2:00 PM today with Dr Brita Romp   Preventive Care 36 Years and Older, Female Preventive care refers to lifestyle choices and visits with your health care provider that can promote health and wellness. What does preventive care include?  A yearly physical exam. This is also called an annual well check.  Dental exams once or twice a year.  Routine eye exams. Ask your health care provider how often you should have your eyes checked.  Personal lifestyle choices, including:  Daily care of your teeth and gums.  Regular physical activity.  Eating a healthy diet.  Avoiding tobacco and drug use.  Limiting alcohol use.  Practicing safe sex.  Taking low-dose aspirin every day.  Taking vitamin and mineral supplements as recommended by your health care provider. What happens during an annual well check? The services and screenings done by your health care provider during your annual well check will depend on your age, overall health, lifestyle risk factors, and family history of disease. Counseling  Your health care provider may ask you questions about  your:  Alcohol use.  Tobacco use.  Drug use.  Emotional well-being.  Home and relationship well-being.  Sexual activity.  Eating habits.  History of falls.  Memory and ability to understand (cognition).  Work and work Statistician.  Reproductive health. Screening  You may have the following tests or measurements:  Height, weight, and BMI.  Blood pressure.  Lipid and cholesterol levels. These may be checked every 5 years, or more frequently if you are over 40 years old.  Skin check.  Lung cancer screening. You may have this screening every year starting at age 48 if you have a 30-pack-year history of smoking and currently smoke or have quit within the past 15 years.  Fecal occult blood test (FOBT) of the stool. You may have this test every year starting at age 66.  Flexible sigmoidoscopy or colonoscopy. You may have a sigmoidoscopy every 5 years or a colonoscopy every 10 years starting at age 73.  Hepatitis C blood test.  Hepatitis B blood test.  Sexually transmitted disease (STD) testing.  Diabetes screening. This is done by checking your blood sugar (glucose) after you have not eaten for a while (fasting). You may have this done every 1-3 years.  Bone density scan. This is done to screen for osteoporosis. You may have this done starting at age 42.  Mammogram. This may be done every 1-2 years. Talk to your health care provider about how often you should have regular mammograms. Talk with your health care provider about your test results, treatment options, and if necessary, the need for more tests. Vaccines  Your health care provider may recommend certain vaccines, such as:  Influenza vaccine. This is recommended every  year.  Tetanus, diphtheria, and acellular pertussis (Tdap, Td) vaccine. You may need a Td booster every 10 years.  Zoster vaccine. You may need this after age 10.  Pneumococcal 13-valent conjugate (PCV13) vaccine. One dose is recommended  after age 61.  Pneumococcal polysaccharide (PPSV23) vaccine. One dose is recommended after age 53. Talk to your health care provider about which screenings and vaccines you need and how often you need them. This information is not intended to replace advice given to you by your health care provider. Make sure you discuss any questions you have with your health care provider. Document Released: 10/24/2015 Document Revised: 06/16/2016 Document Reviewed: 07/29/2015 Elsevier Interactive Patient Education  2017 [place] Prevention in the Home Falls can cause injuries. They can happen to people of all ages. There are many things you can do to make your home safe and to help prevent falls. What can I do on the outside of my home?  Regularly fix the edges of walkways and driveways and fix any cracks.  Remove anything that might make you trip as you walk through a door, such as a raised step or threshold.  Trim any bushes or trees on the path to your home.  Use bright outdoor lighting.  Clear any walking paths of anything that might make someone trip, such as rocks or tools.  Regularly check to see if handrails are loose or broken. Make sure that both sides of any steps have handrails.  Any raised decks and porches should have guardrails on the edges.  Have any leaves, snow, or ice cleared regularly.  Use sand or salt on walking paths during winter.  Clean up any spills in your garage right away. This includes oil or grease spills. What can I do in the bathroom?  Use night lights.  Install grab bars by the toilet and in the tub and shower. Do not use towel bars as grab bars.  Use non-skid mats or decals in the tub or shower.  If you need to sit down in the shower, use a plastic, non-slip stool.  Keep the floor dry. Clean up any water that spills on the floor as soon as it happens.  Remove soap buildup in the tub or shower regularly.  Attach bath mats securely with  double-sided non-slip rug tape.  Do not have throw rugs and other things on the floor that can make you trip. What can I do in the bedroom?  Use night lights.  Make sure that you have a light by your bed that is easy to reach.  Do not use any sheets or blankets that are too big for your bed. They should not hang down onto the floor.  Have a firm chair that has side arms. You can use this for support while you get dressed.  Do not have throw rugs and other things on the floor that can make you trip. What can I do in the kitchen?  Clean up any spills right away.  Avoid walking on wet floors.  Keep items that you use a lot in easy-to-reach places.  If you need to reach something above you, use a strong step stool that has a grab bar.  Keep electrical cords out of the way.  Do not use floor polish or wax that makes floors slippery. If you must use wax, use non-skid floor wax.  Do not have throw rugs and other things on the floor that can make you trip. What can  I do with my stairs?  Do not leave any items on the stairs.  Make sure that there are handrails on both sides of the stairs and use them. Fix handrails that are broken or loose. Make sure that handrails are as long as the stairways.  Check any carpeting to make sure that it is firmly attached to the stairs. Fix any carpet that is loose or worn.  Avoid having throw rugs at the top or bottom of the stairs. If you do have throw rugs, attach them to the floor with carpet tape.  Make sure that you have a light switch at the top of the stairs and the bottom of the stairs. If you do not have them, ask someone to add them for you. What else can I do to help prevent falls?  Wear shoes that:  Do not have high heels.  Have rubber bottoms.  Are comfortable and fit you well.  Are closed at the toe. Do not wear sandals.  If you use a stepladder:  Make sure that it is fully opened. Do not climb a closed stepladder.  Make  sure that both sides of the stepladder are locked into place.  Ask someone to hold it for you, if possible.  Clearly mark and make sure that you can see:  Any grab bars or handrails.  First and last steps.  Where the edge of each step is.  Use tools that help you move around (mobility aids) if they are needed. These include:  Canes.  Walkers.  Scooters.  Crutches.  Turn on the lights when you go into a dark area. Replace any light bulbs as soon as they burn out.  Set up your furniture so you have a clear path. Avoid moving your furniture around.  If any of your floors are uneven, fix them.  If there are any pets around you, be aware of where they are.  Review your medicines with your doctor. Some medicines can make you feel dizzy. This can increase your chance of falling. Ask your doctor what other things that you can do to help prevent falls. This information is not intended to replace advice given to you by your health care provider. Make sure you discuss any questions you have with your health care provider. Document Released: 07/24/2009 Document Revised: 03/04/2016 Document Reviewed: 11/01/2014 Elsevier Interactive Patient Education  2017 [employer].

## 2018-09-20 NOTE — Progress Notes (Signed)
Subjective:   Sheila Kim is a 77 y.o. female who presents for Medicare Annual (Subsequent) preventive examination.  Review of Systems:  N/A  Cardiac Risk Factors include: advanced age (>24men, >21 women);hypertension;dyslipidemia     Objective:     Vitals: BP (!) 144/86 (BP Location: Left Arm)   Pulse (!) 49   Temp 97.7 F (36.5 C) (Oral)   Ht 4\' 11"  (1.499 m)   Wt 123 lb 3.2 oz (55.9 kg)   BMI 24.88 kg/m   Body mass index is 24.88 kg/m.  Advanced Directives 09/20/2018 09/16/2017 09/15/2016 10/03/2015 09/17/2015 03/18/2015  Does Patient Have a Medical Advance Directive? Yes Yes Yes Yes Yes Yes  Type of Paramedic of [place];Living will [place];Living will [place];Living will [place];Living will Living will;Healthcare Power of [place];Living will  Does patient want to make changes to medical advance directive? - - - No - Patient declined - -  Copy of [place] in Chart? Yes - validated most recent copy scanned in chart (See row information) No - copy requested - No - copy requested - -    Tobacco Social History   Tobacco Use  Smoking Status Never Smoker  Smokeless Tobacco Never Used     Counseling given: Not Answered   Clinical Intake:  Pre-visit preparation completed: Yes  Pain : No/denies pain Pain Score: 0-No pain     Nutritional Status: BMI of 19-24  Normal Nutritional Risks: None Diabetes: No  How often do you need to have someone help you when you read instructions, pamphlets, or other written materials from your doctor or pharmacy?: 1 - Never  Interpreter Needed?: No  Information entered by :: [facility], LPN  Past Medical History:  Diagnosis Date  . Anemia    history  . Arthritis    hands, spine  . Chronic kidney disease    reports one kidney "doesn't work well"  . GERD (gastroesophageal reflux  disease)   . Hypertension   . Motion sickness    boats  . Seasonal allergies    Past Surgical History:  Procedure Laterality Date  . ABDOMINAL HYSTERECTOMY    . BACK SURGERY    . BREAST CYST ASPIRATION Left   . COLONOSCOPY WITH PROPOFOL N/A 10/03/2015   Procedure: COLONOSCOPY WITH PROPOFOL;  Surgeon: Lucilla Lame, MD;  Location: [facility];  Service: Endoscopy;  Laterality: N/A;  . 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 DILATION AND CURETTAGE OF UTERUS    . EYE SURGERY Bilateral 2015   cataract  . NOSE SURGERY  1962   deviated septum, hemorrhaged- pt had to return to hospital  . [place]  . ureteropelvic junction obstruction     Family History  Problem Relation Age of Onset  . Heart attack Mother        3 MI's  . Diabetes Mother   . Heart disease Mother   . Diabetes Father   . Heart attack Father   . COPD Father   . Stroke Brother   . Diabetes Sister   . Heart disease Sister        2 stents  . Breast cancer Neg Hx    Social History   Socioeconomic History  . Marital status: Widowed    Spouse name: Hassell Done  . Number of children: 2  . Years of education: 94  . Highest education level:  Some college, no degree  Occupational History  . Occupation: retired  Scientific laboratory technician  . Financial resource strain: Not hard at all  . Food insecurity:    Worry: Never true    Inability: Never true  . Transportation needs:    Medical: No    Non-medical: No  Tobacco Use  . Smoking status: Never Smoker  . Smokeless tobacco: Never Used  Substance and Sexual Activity  . Alcohol use: No  . Drug use: No  . Sexual activity: Not on file  Lifestyle  . Physical activity:    Days per week: 6 days    Minutes per session: 60 min  . Stress: Only a little  Relationships  . Social connections:    Talks on phone: Patient refused    Gets together: Patient refused    Attends religious service: Patient refused    Active member of club or organization:  Patient refused    Attends meetings of clubs or organizations: Patient refused    Relationship status: Patient refused  Other Topics Concern  . Not on file  Social History Narrative  . Not on file    Outpatient Encounter Medications as of 09/20/2018  Medication Sig  . acetaminophen (TYLENOL) 500 MG tablet Take 650 mg by mouth daily.   Marland Kitchen acetaminophen (TYLENOL) 500 MG tablet Take 1,000 mg by mouth at bedtime.  . Alpha-D-Galactosidase (BEANO TO GO) TABS Take by mouth.   Marland Kitchen aspirin 81 MG tablet Take 81 mg by mouth daily.   Marland Kitchen CALCIUM CITRATE PO Take 315 mg by mouth 2 (two) times daily.   . cholecalciferol (VITAMIN D) 1000 UNITS tablet Take 1,000 Units by mouth daily.   . Coenzyme Q10 (COQ10) 100 MG CAPS Take 200 mg by mouth daily.   . cyanocobalamin 1000 MCG tablet Take 1,000 mcg by mouth daily.   . ferrous sulfate 325 (65 FE) MG tablet Take 325 mg by mouth daily with breakfast.   . fluticasone (FLONASE) 50 MCG/ACT nasal spray Place 2 sprays into both nostrils daily.  . folic acid (FOLVITE) 1 MG tablet Take 1 mg by mouth daily.   . furosemide (LASIX) 40 MG tablet TAKE 0.5 TABLETS (20 MG TOTAL) BY MOUTH 2 (TWO) TIMES DAILY. (Patient taking differently: Take 10 mg by mouth 2 (two) times daily. )  . ketotifen (ZADITOR) 0.025 % ophthalmic solution Place 1 drop into both eyes as needed.  Marland Kitchen KLOR-CON M20 20 MEQ tablet TAKE 1 TABLET TWICE A DAY (Patient taking differently: Take 20 mEq by mouth daily. )  . lidocaine (LIDODERM) 5 % LIDODERM, 5% (External Patch)  one to three Patch Patch Patch may apply up to 3 x 12 hours then off 12 hours for 0 days  Quantity: 30;  Refills: 5   Ordered :28-Sep-2013  [name] ;  Started 28-Sep-2013 Active  . loratadine (CLARITIN) 10 MG tablet Take 10 mg by mouth daily.   Marland Kitchen MELATONIN EXTRA STRENGTH PO Take 0.25 tablets by mouth at bedtime as needed.   . Multiple Vitamins-Minerals ([employer] SPECTRAVITE ADULT 50+ PO) Education officer, community - Historical  Medication  daily Active  . pantoprazole (PROTONIX) 40 MG tablet TAKE 1 TABLET TWO TIMES DAILY  . Polyethyl Glycol-Propyl Glycol (SYSTANE) 0.4-0.3 % SOLN Apply to eye.   . polyethylene glycol (MIRALAX / GLYCOLAX) packet Take 17 g by mouth daily.  . Probiotic Product (CVS SENIOR PROBIOTIC) CAPS Take by mouth daily.   . propranolol (INDERAL) 40 MG tablet TAKE 1/2 TABLET  BY MOUTH TWICE A DAY  . Psyllium Husk POWD Take by mouth daily.   Marland Kitchen pyridoxine (B-6) 100 MG tablet Take 100 mg by mouth daily.   . ranitidine (ZANTAC) 150 MG capsule Take 150 mg by mouth 2 (two) times daily.  . rosuvastatin (CRESTOR) 5 MG tablet TAKE 1 TABLET DAILY EXCEPT THURSDAY AND SUNDAY  . Garlic (GARLIQUE PO) Take by mouth.   No facility-administered encounter medications on file as of 09/20/2018.     Activities of Daily Living In your present state of health, do you have any difficulty performing the following activities: 09/20/2018  Hearing? N  Vision? N  Comment Wears eye glasses  Difficulty concentrating or making decisions? N  Walking or climbing stairs? N  Dressing or bathing? N  Doing errands, shopping? N  Preparing Food and eating ? N  Using the Toilet? N  In the past six months, have you accidently leaked urine? Y  Comment Occasionally, does not have to wear protection.   Do you have problems with loss of bowel control? N  Managing your Medications? N  Managing your Finances? Y  Comment Son help with finances.   Housekeeping or managing your Housekeeping? N  Some recent data might be hidden    Patient Care Team: Virginia Crews, MD as PCP - General (Family Medicine) Dingeldein, Remo Lipps, MD as Consulting Physician (Ophthalmology) Isaias Cowman, MD as Consulting Physician (Cardiology) Abbie Sons, MD as Consulting Physician (Urology)    Assessment:   This is a routine wellness examination for Sheila Kim.  Exercise Activities and Dietary recommendations Current Exercise Habits:  Structured exercise class, Type of exercise: strength training/weights;stretching;treadmill;walking, Time (Minutes): 60, Frequency (Times/Week): 6, Weekly Exercise (Minutes/Week): 360, Intensity: Moderate  Goals    . DIET - REDUCE SUGAR INTAKE     Recommend cutting back and monitoring of dessert consumed in daily diet.       Fall Risk Fall Risk  09/20/2018 09/16/2017 09/15/2016 03/18/2015  Falls in the past year? 0 No No No   FALL RISK PREVENTION PERTAINING TO THE HOME:  Any stairs in or around the home WITH handrails? No  Home free of loose throw rugs in walkways, pet beds, electrical cords, etc? Yes  Adequate lighting in your home to reduce risk of falls? Yes   ASSISTIVE DEVICES UTILIZED TO PREVENT FALLS:  Life alert? No  Use of a cane, walker or w/c? No  Grab bars in the bathroom? Yes  Shower chair or bench in shower? No  Elevated toilet seat or a handicapped toilet? Yes    TIMED UP AND GO:  Was the test performed? No .     Depression Screen PHQ 2/9 Scores 09/20/2018 09/20/2018 09/16/2017 09/15/2016  PHQ - 2 Score 0 0 0 0  PHQ- 9 Score 2 - - -     Cognitive Function     6CIT Screen 09/20/2018  What Year? 0 points  What month? 0 points  What time? 0 points  Count back from 20 0 points  Months in reverse 0 points  Repeat phrase 0 points  Total Score 0    Immunization History  Administered Date(s) Administered  . Influenza, High Dose Seasonal PF 07/26/2015, 08/06/2016, 07/21/2017, 08/03/2018  . Pneumococcal Conjugate-13 07/16/2014  . Pneumococcal Polysaccharide-23 03/16/1999, 08/21/2005  . Tdap 07/01/2011  . Zoster 10/01/2011    Qualifies for Shingles Vaccine? Yes  Zostavax completed 10/01/11. Due for Shingrix. Education has been provided regarding the importance of this vaccine. Pt has been advised  to call insurance company to determine out of pocket expense. Advised may also receive vaccine at local pharmacy or Health Dept. Verbalized acceptance and  understanding.  Tdap: Up to date  Flu Vaccine: Up to date  Pneumococcal Vaccine: Up to date   Screening Tests Health Maintenance  Topic Date Due  . DEXA SCAN  09/20/2028 (Originally 04/10/2011)  . TETANUS/TDAP  06/30/2021  . INFLUENZA VACCINE  Completed  . PNA vac Low Risk Adult  Completed    Cancer Screenings:  Colorectal Screening: No longer required.   Mammogram: No longer required.    Bone Density: Completed 04/09/09. Results reflect OSTEOPOROSIS. Repeat every 2 years. Pt declined order today or in the future.  Lung Cancer Screening: (Low Dose CT Chest recommended if Age 83-80 years, 30 pack-year currently smoking OR have quit w/in 15years.) does not qualify.    Additional Screening:  Vision Screening: Recommended annual ophthalmology exams for early detection of glaucoma and other disorders of the eye.  Dental Screening: Recommended annual dental exams for proper oral hygiene  Community Resource Referral:  CRR required this visit?  No       Plan:  I have personally reviewed and addressed the Medicare Annual Wellness questionnaire and have noted the following in the patient's chart:  A. Medical and social history B. Use of alcohol, tobacco or illicit drugs  C. Current medications and supplements D. Functional ability and status E.  Nutritional status F.  Physical activity G. Advance directives H. List of other physicians I.  Hospitalizations, surgeries, and ER visits in previous 12 months J.  [place] such as hearing and vision if needed, cognitive and depression L. Referrals and appointments - none  In addition, I have reviewed and discussed with patient certain preventive protocols, quality metrics, and best practice recommendations. A written personalized care plan for preventive services as well as general preventive health recommendations were provided to patient.  See attached scanned questionnaire for additional information.   Signed,    Fabio Neighbors, LPN Nurse Health Advisor   Nurse Recommendations: None.

## 2018-09-20 NOTE — Assessment & Plan Note (Signed)
Discussed natural course, etiology, and symptomatic management Continue flonase

## 2018-09-20 NOTE — Assessment & Plan Note (Signed)
Followed by Cardiology Risk factor control as above

## 2018-09-20 NOTE — Assessment & Plan Note (Signed)
Chronic, stable Recheck CMP

## 2018-09-20 NOTE — Assessment & Plan Note (Signed)
Continue Crestor 5 times weekly Recheck lipid panel and CMP Goal LDL <70 Also followed by Cardiology 

## 2018-09-20 NOTE — Patient Instructions (Addendum)
Can switch from Zantac (ranitidine) to Pepcid (famotidine)   The CDC recommends two doses of Shingrix (the shingles vaccine) separated by 2 to 6 months for adults age 79 years and older. I recommend checking with your insurance plan regarding coverage for this vaccine.     Preventive Care 24 Years and Older, Female Preventive care refers to lifestyle choices and visits with your health care provider that can promote health and wellness. What does preventive care include?  A yearly physical exam. This is also called an annual well check.  Dental exams once or twice a year.  Routine eye exams. Ask your health care provider how often you should have your eyes checked.  Personal lifestyle choices, including: ? Daily care of your teeth and gums. ? Regular physical activity. ? Eating a healthy diet. ? Avoiding tobacco and drug use. ? Limiting alcohol use. ? Practicing safe sex. ? Taking low-dose aspirin every day. ? Taking vitamin and mineral supplements as recommended by your health care provider. What happens during an annual well check? The services and screenings done by your health care provider during your annual well check will depend on your age, overall health, lifestyle risk factors, and family history of disease. Counseling Your health care provider may ask you questions about your:  Alcohol use.  Tobacco use.  Drug use.  Emotional well-being.  Home and relationship well-being.  Sexual activity.  Eating habits.  History of falls.  Memory and ability to understand (cognition).  Work and work Statistician.  Reproductive health.  Screening You may have the following tests or measurements:  Height, weight, and BMI.  Blood pressure.  Lipid and cholesterol levels. These may be checked every 5 years, or more frequently if you are over 15 years old.  Skin check.  Lung cancer screening. You may have this screening every year starting at age 83 if you have a  30-pack-year history of smoking and currently smoke or have quit within the past 15 years.  Fecal occult blood test (FOBT) of the stool. You may have this test every year starting at age 57.  Flexible sigmoidoscopy or colonoscopy. You may have a sigmoidoscopy every 5 years or a colonoscopy every 10 years starting at age 45.  Hepatitis C blood test.  Hepatitis B blood test.  Sexually transmitted disease (STD) testing.  Diabetes screening. This is done by checking your blood sugar (glucose) after you have not eaten for a while (fasting). You may have this done every 1-3 years.  Bone density scan. This is done to screen for osteoporosis. You may have this done starting at age 60.  Mammogram. This may be done every 1-2 years. Talk to your health care provider about how often you should have regular mammograms.  Talk with your health care provider about your test results, treatment options, and if necessary, the need for more tests. Vaccines Your health care provider may recommend certain vaccines, such as:  Influenza vaccine. This is recommended every year.  Tetanus, diphtheria, and acellular pertussis (Tdap, Td) vaccine. You may need a Td booster every 10 years.  Varicella vaccine. You may need this if you have not been vaccinated.  Zoster vaccine. You may need this after age 58.  Measles, mumps, and rubella (MMR) vaccine. You may need at least one dose of MMR if you were born in 1957 or later. You may also need a second dose.  Pneumococcal 13-valent conjugate (PCV13) vaccine. One dose is recommended after age 81.  Pneumococcal polysaccharide (  PPSV23) vaccine. One dose is recommended after age 69.  Meningococcal vaccine. You may need this if you have certain conditions.  Hepatitis A vaccine. You may need this if you have certain conditions or if you travel or work in places where you may be exposed to hepatitis A.  Hepatitis B vaccine. You may need this if you have certain  conditions or if you travel or work in places where you may be exposed to hepatitis B.  Haemophilus influenzae type b (Hib) vaccine. You may need this if you have certain conditions.  Talk to your health care provider about which screenings and vaccines you need and how often you need them. This information is not intended to replace advice given to you by your health care provider. Make sure you discuss any questions you have with your health care provider. Document Released: 10/24/2015 Document Revised: 06/16/2016 Document Reviewed: 07/29/2015 Elsevier Interactive Patient Education  [employer].

## 2018-09-20 NOTE — Assessment & Plan Note (Signed)
Risk factor control as above

## 2018-09-20 NOTE — Assessment & Plan Note (Signed)
Well controlled Asymptomatic Continue PPI and H2 blocker

## 2018-09-20 NOTE — Assessment & Plan Note (Signed)
Chronic and stable Not on medications Continue exercise and low carb diet Recheck A1c

## 2018-09-20 NOTE — Progress Notes (Signed)
Patient: Sheila Kim, Female    DOB: 11-13-1940, 77 y.o.   MRN: [id number] Visit Date: 09/20/2018  Today's Provider: Lavon Paganini, MD   Chief Complaint  Patient presents with  . Annual Exam   Subjective:   Patient saw [facility] for AVW today.   Complete Physical Sheila Kim is a 77 y.o. female. She feels well. She reports exercising yes. She reports she is sleeping good.  R ear popping over last few days Has happened before and self resolve ----------------------------------------------------------   Review of Systems  HENT: Positive for postnasal drip, rhinorrhea and sneezing.   Eyes: Positive for itching.  Genitourinary: Positive for enuresis.  Musculoskeletal: Positive for arthralgias, back pain and myalgias.  Allergic/Immunologic: Positive for environmental allergies.  Neurological: Positive for tremors.  Psychiatric/Behavioral: Positive for sleep disturbance.  All other systems reviewed and are negative.   Social History   Socioeconomic History  . Marital status: Widowed    Spouse name: Hassell Done  . Number of children: 2  . Years of education: 90  . Highest education level: Some college, no degree  Occupational History  . Occupation: retired  Scientific laboratory technician  . Financial resource strain: Not hard at all  . Food insecurity:    Worry: Never true    Inability: Never true  . Transportation needs:    Medical: No    Non-medical: No  Tobacco Use  . Smoking status: Never Smoker  . Smokeless tobacco: Never Used  Substance and Sexual Activity  . Alcohol use: No  . Drug use: No  . Sexual activity: Not on file  Lifestyle  . Physical activity:    Days per week: 6 days    Minutes per session: 60 min  . Stress: Only a little  Relationships  . Social connections:    Talks on phone: Patient refused    Gets together: Patient refused    Attends religious service: Patient refused    Active member of club or organization: Patient  refused    Attends meetings of clubs or organizations: Patient refused    Relationship status: Patient refused  . Intimate partner violence:    Fear of current or ex partner: Patient refused    Emotionally abused: Patient refused    Physically abused: Patient refused    Forced sexual activity: Patient refused  Other Topics Concern  . Not on file  Social History Narrative  . Not on file    Past Medical History:  Diagnosis Date  . Anemia    history  . Arthritis    hands, spine  . Chronic kidney disease    reports one kidney "doesn't work well"  . GERD (gastroesophageal reflux disease)   . Hypertension   . Motion sickness    boats  . Seasonal allergies      Patient Active Problem List   Diagnosis Date Noted  . Dysfunction of right eustachian tube 09/20/2018  . Tinea corporis 09/17/2015  . Allergic rhinitis 02/12/2015  . Atherosclerosis of coronary artery 02/12/2015  . Chronic kidney disease (CKD), stage II (mild) 02/12/2015  . Colitis 02/12/2015  . DD (diverticular disease) 02/12/2015  . Gastroesophageal reflux disease with hiatal hernia 02/12/2015  . Personal history of methicillin resistant Staphylococcus aureus 02/12/2015  . H/O arthrodesis 02/12/2015  . Hypercholesteremia 02/12/2015  . Blood glucose elevated 02/12/2015  . BP (high blood pressure) 02/12/2015  . Heart & renal disease, hypertensive, 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[place]) 02/12/2015  . Arthritis, degenerative 02/12/2015  .  OP (osteoporosis) 02/12/2015  . Avitaminosis D 02/12/2015  . Myocardial infarction ([place]) 04/15/2014  . Pleural cavity effusion 06/08/2013  . Disorder of right ventricle of heart 05/20/2013  . H/O coronary artery bypass surgery 05/17/2013  . History of knee surgery 05/16/2013    Past Surgical History:  Procedure Laterality Date  . ABDOMINAL HYSTERECTOMY    . BACK SURGERY    . BREAST CYST ASPIRATION Left   . COLONOSCOPY WITH PROPOFOL N/A 10/03/2015   Procedure: COLONOSCOPY WITH PROPOFOL;   Surgeon: Lucilla Lame, MD;  Location: [facility];  Service: Endoscopy;  Laterality: N/A;  . 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 DILATION AND CURETTAGE OF UTERUS    . EYE SURGERY Bilateral 2015   cataract  . NOSE SURGERY  1962   deviated septum, hemorrhaged- pt had to return to hospital  . [place]  . ureteropelvic junction obstruction      Her family history includes COPD in her father; Diabetes in her father, mother, and sister; Heart attack in her father and mother; Heart disease in her mother and sister; Stroke in her brother. There is no history of Breast cancer.      Current Outpatient Medications:  .  acetaminophen (TYLENOL) 500 MG tablet, Take 650 mg by mouth daily. , Disp: , Rfl:  .  acetaminophen (TYLENOL) 500 MG tablet, Take 1,000 mg by mouth at bedtime., Disp: , Rfl:  .  Alpha-D-Galactosidase (BEANO TO GO) TABS, Take by mouth. , Disp: , Rfl:  .  aspirin 81 MG tablet, Take 81 mg by mouth daily. , Disp: , Rfl:  .  CALCIUM CITRATE PO, Take 315 mg by mouth 2 (two) times daily. , Disp: , Rfl:  .  cholecalciferol (VITAMIN D) 1000 UNITS tablet, Take 1,000 Units by mouth daily. , Disp: , Rfl:  .  Coenzyme Q10 (COQ10) 100 MG CAPS, Take 200 mg by mouth daily. , Disp: , Rfl:  .  cyanocobalamin 1000 MCG tablet, Take 1,000 mcg by mouth daily. , Disp: , Rfl:  .  ferrous sulfate 325 (65 FE) MG tablet, Take 325 mg by mouth daily with breakfast. , Disp: , Rfl:  .  fluticasone (FLONASE) 50 MCG/ACT nasal spray, Place 2 sprays into both nostrils daily., Disp: , Rfl:  .  folic acid (FOLVITE) 1 MG tablet, Take 1 mg by mouth daily. , Disp: , Rfl:  .  furosemide (LASIX) 40 MG tablet, TAKE 0.5 TABLETS (20 MG TOTAL) BY MOUTH 2 (TWO) TIMES DAILY. (Patient taking differently: Take 10 mg by mouth 2 (two) times daily. ), Disp: 90 tablet, Rfl: 1 .  ketotifen (ZADITOR) 0.025 % ophthalmic solution, Place 1 drop into both eyes as needed., Disp: , Rfl:  .  lidocaine  (LIDODERM) 5 %, LIDODERM, 5% (External Patch)  one to three Patch Patch Patch may apply up to 3 x 12 hours then off 12 hours for 0 days  Quantity: 30;  Refills: 5   Ordered :28-Sep-2013  [name] ;  Started 28-Sep-2013 Active, Disp: , Rfl:  .  loratadine (CLARITIN) 10 MG tablet, Take 10 mg by mouth daily. , Disp: , Rfl:  .  MELATONIN EXTRA STRENGTH PO, Take 0.25 tablets by mouth at bedtime as needed. , Disp: , Rfl:  .  Multiple Vitamins-Minerals ([facility] SPECTRAVITE ADULT 50+ PO), Education officer, community - Historical Medication  daily Active, Disp: , Rfl:  .  pantoprazole (PROTONIX) 40 MG tablet, TAKE 1 TABLET TWO  TIMES DAILY, Disp: 180 tablet, Rfl: 3 .  Polyethyl Glycol-Propyl Glycol (SYSTANE) 0.4-0.3 % SOLN, Apply to eye. , Disp: , Rfl:  .  polyethylene glycol (MIRALAX / GLYCOLAX) packet, Take 17 g by mouth daily., Disp: , Rfl:  .  potassium chloride SA (KLOR-CON M20) 20 MEQ tablet, Take 1 tablet (20 mEq total) by mouth daily., Disp: 90 tablet, Rfl: 3 .  Probiotic Product ([facility]S SENIOR PROBIOTIC) CAPS, Take by mouth daily. , Disp: , Rfl:  .  propranolol (INDERAL) 40 MG tablet, TAKE 1/2 TABLET BY MOUTH TWICE A DAY, Disp: 180 tablet, Rfl: 1 .  Psyllium Husk POWD, Take by mouth daily. , Disp: , Rfl:  .  pyridoxine (B-6) 100 MG tablet, Take 100 mg by mouth daily. , Disp: , Rfl:  .  ranitidine (ZANTAC) 150 MG capsule, Take 150 mg by mouth 2 (two) times daily., Disp: , Rfl:  .  rosuvastatin (CRESTOR) 5 MG tablet, TAKE 1 TABLET DAILY EXCEPT THURSDAY AND SUNDAY, Disp: 90 tablet, Rfl: 2  Patient Care Team: Virginia Crews, MD as PCP - General (Family Medicine) Dingeldein, Remo Lipps, MD as Consulting Physician (Ophthalmology) Isaias Cowman, MD as Consulting Physician (Cardiology) Abbie Sons, MD as Consulting Physician (Urology)     Objective:    Vitals: BP 144/86 (BP Location: Left Arm)   Pulse 49    Temp 97.7 F (36.5 C) (Oral)   Ht 4\' 11"  (1.499 m)   Wt 123 lb  3.2 oz (55.9 kg)   BMI 24.88 kg/m   BSA 1.53 m   Pain  0-No pain    Physical Exam  Constitutional: She is oriented to person, place, and time. She appears well-developed and well-nourished. No distress.  HENT:  Head: Normocephalic and atraumatic.  Right Ear: External ear normal.  Left Ear: External ear normal.  Nose: Nose normal.  Mouth/Throat: Oropharynx is clear and moist.  Eyes: Pupils are equal, round, and reactive to light. Conjunctivae and EOM are normal. No scleral icterus.  Neck: Neck supple. No thyromegaly present.  Cardiovascular: Normal rate, regular rhythm, normal heart sounds and intact distal pulses.  No murmur heard. Pulmonary/Chest: Effort normal and breath sounds normal. No respiratory distress. She has no wheezes. She has no rales.  Abdominal: Soft. Bowel sounds are normal. She exhibits no distension. There is no tenderness. There is no rebound and no guarding.  Musculoskeletal: She exhibits no edema or deformity.  Lymphadenopathy:    She has no cervical adenopathy.  Neurological: She is alert and oriented to person, place, and time.  Skin: Skin is warm and dry. Capillary refill takes less than 2 seconds. No rash noted.  Psychiatric: She has a normal mood and affect. Her behavior is normal.  Vitals reviewed.   Activities of Daily Living In your present state of health, do you have any difficulty performing the following activities: 09/20/2018  Hearing? N  Vision? N  Comment Wears eye glasses  Difficulty concentrating or making decisions? N  Walking or climbing stairs? N  Dressing or bathing? N  Doing errands, shopping? N  Preparing Food and eating ? N  Using the Toilet? N  In the past six months, have you accidently leaked urine? Y  Comment Occasionally, does not have to wear protection.   Do you have problems with loss of bowel control? N  Managing your Medications? N  Managing your Finances? Y  Comment Son help with finances.   Housekeeping or  managing your Housekeeping? N  Some recent data might  be hidden    Fall Risk Assessment Fall Risk  09/20/2018 09/16/2017 09/15/2016 03/18/2015  Falls in the past year? 0 No No No     Depression Screen PHQ 2/9 Scores 09/20/2018 09/20/2018 09/16/2017 09/15/2016  PHQ - 2 Score 0 0 0 0  PHQ- 9 Score 2 - - -    6CIT Screen 09/20/2018  What Year? 0 points  What month? 0 points  What time? 0 points  Count back from 20 0 points  Months in reverse 0 points  Repeat phrase 0 points  Total Score 0      Assessment & Plan:    Annual Physical Reviewed patient's Family Medical History Reviewed and updated list of patient's medical providers Assessment of cognitive impairment was done Assessed patient's functional ability Established a written schedule for health screening [place] Completed and Reviewed  Exercise Activities and Dietary recommendations Goals    . DIET - REDUCE SUGAR INTAKE     Recommend cutting back and monitoring of dessert consumed in daily diet.       Immunization History  Administered Date(s) Administered  . Influenza, High Dose Seasonal PF 07/26/2015, 08/06/2016, 07/21/2017, 08/03/2018  . Pneumococcal Conjugate-13 07/16/2014  . Pneumococcal Polysaccharide-23 03/16/1999, 08/21/2005  . Tdap 07/01/2011  . Zoster 10/01/2011    Health Maintenance  Topic Date Due  . DEXA SCAN  09/20/2028 (Originally 04/10/2011)  . TETANUS/TDAP  06/30/2021  . INFLUENZA VACCINE  Completed  . PNA vac Low Risk Adult  Completed     Discussed health benefits of physical activity, and encouraged her to engage in regular exercise appropriate for her age and condition.    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[place])    Risk factor control as above        Respiratory   Gastroesophageal reflux disease with hiatal hernia    Well controlled Asymptomatic Continue PPI and H2 blocker      Relevant Orders   CBC     Nervous and Auditory   Dysfunction of right eustachian tube    Discussed natural course, etiology, and symptomatic management Continue flonase        Genitourinary   Chronic kidney disease (CKD), stage II (mild)    Chronic, stable Recheck CMP      Relevant Orders   Comprehensive metabolic panel     Other   Hypercholesteremia    Continue Crestor 5 times weekly Recheck lipid panel and CMP Goal LDL <70 Also followed by Cardiology      Relevant Orders   Lipid panel   Comprehensive metabolic panel   Blood glucose elevated    Chronic and stable Not on medications Continue exercise and low carb diet Recheck A1c      Relevant Orders   Hemoglobin A1c   H/O coronary artery bypass surgery    Followed by Cardiology Risk factor control as above      Relevant Orders   CBC    Other Visit Diagnoses    Encounter for annual physical exam    -  Primary   Relevant Orders   Lipid panel   Comprehensive metabolic panel   Hemoglobin A1c  CBC       Return in about 6 months (around 03/22/2019) for chronic disease f/u.   The entirety of the information documented in the History of Present Illness, Review of Systems and Physical Exam were personally obtained by me. Portions of this information were initially documented by April Miller, CMA and reviewed by me for thoroughness and accuracy.    Virginia Crews, MD, MPH [facility] 09/20/2018 3:26 PM

## 2018-09-21 LAB — CBC
Hematocrit: 39.9 % (ref 34.0–46.6)
Hemoglobin: 14.2 g/dL (ref 11.1–15.9)
MCH: 32.9 pg (ref 26.6–33.0)
MCHC: 35.6 g/dL (ref 31.5–35.7)
MCV: 93 fL (ref 79–97)
Platelets: 319 10*3/uL (ref 150–450)
RBC: 4.31 x10E6/uL (ref 3.77–5.28)
RDW: 11.8 % — ABNORMAL LOW (ref 12.3–15.4)
WBC: 7.9 10*3/uL (ref 3.4–10.8)

## 2018-09-21 LAB — COMPREHENSIVE METABOLIC PANEL
ALT: 17 IU/L (ref 0–32)
AST: 23 IU/L (ref 0–40)
Albumin/Globulin Ratio: 1.9 (ref 1.2–2.2)
Albumin: 4.4 g/dL (ref 3.5–4.8)
Alkaline Phosphatase: 54 IU/L (ref 39–117)
BUN/Creatinine Ratio: 16 (ref 12–28)
BUN: 17 mg/dL (ref 8–27)
Bilirubin Total: 0.9 mg/dL (ref 0.0–1.2)
CO2: 27 mmol/L (ref 20–29)
Calcium: 10.1 mg/dL (ref 8.7–10.3)
Chloride: 97 mmol/L (ref 96–106)
Creatinine, Ser: 1.06 mg/dL — ABNORMAL HIGH (ref 0.57–1.00)
GFR calc Af Amer: 58 mL/min/{1.73_m2} — ABNORMAL LOW (ref >59)
GFR calc non Af Amer: 50 mL/min/{1.73_m2} — ABNORMAL LOW (ref >59)
GLUCOSE: 96 mg/dL (ref 65–99)
Globulin, Total: 2.3 g/dL (ref 1.5–4.5)
Potassium: 4.2 mmol/L (ref 3.5–5.2)
SODIUM: 139 mmol/L (ref 134–144)
Total Protein: 6.7 g/dL (ref 6.0–8.5)

## 2018-09-21 LAB — LIPID PANEL
Chol/HDL Ratio: 3.7 ratio (ref 0.0–4.4)
Cholesterol, Total: 212 mg/dL — ABNORMAL HIGH (ref 100–199)
HDL: 57 mg/dL (ref >39)
LDL Calculated: 119 mg/dL — ABNORMAL HIGH (ref 0–99)
Triglycerides: 181 mg/dL — ABNORMAL HIGH (ref 0–149)
VLDL Cholesterol Cal: 36 mg/dL (ref 5–40)

## 2018-09-21 LAB — HEMOGLOBIN A1C
Est. average glucose Bld gHb Est-mCnc: 123 mg/dL
Hgb A1c MFr Bld: 5.9 % — ABNORMAL HIGH (ref 4.8–5.6)

## 2018-09-25 ENCOUNTER — Ambulatory Visit (INDEPENDENT_AMBULATORY_CARE_PROVIDER_SITE_OTHER): Payer: Medicare Other | Admitting: Family Medicine

## 2018-09-25 ENCOUNTER — Encounter: Payer: Self-pay | Admitting: Family Medicine

## 2018-09-25 DIAGNOSIS — Z23 Encounter for immunization: Secondary | ICD-10-CM

## 2018-09-25 NOTE — Progress Notes (Signed)
Patient here for Zoster vaccination only.  I did not examine the patient.  I did review his medical history, medications, and allergies and vaccine consent form.  CMA gave vaccination. Patient tolerated well.  Virginia Crews, MD, MPH [facility] 09/25/2018 1:53 PM

## 2018-10-25 DIAGNOSIS — E785 Hyperlipidemia, unspecified: Secondary | ICD-10-CM | POA: Diagnosis not present

## 2018-10-25 DIAGNOSIS — Z951 Presence of aortocoronary bypass graft: Secondary | ICD-10-CM | POA: Diagnosis not present

## 2018-10-25 DIAGNOSIS — I10 Essential (primary) hypertension: Secondary | ICD-10-CM | POA: Diagnosis not present

## 2018-10-25 DIAGNOSIS — I2510 Atherosclerotic heart disease of native coronary artery without angina pectoris: Secondary | ICD-10-CM | POA: Diagnosis not present

## 2018-11-27 ENCOUNTER — Ambulatory Visit (INDEPENDENT_AMBULATORY_CARE_PROVIDER_SITE_OTHER): Payer: Medicare Other | Admitting: Family Medicine

## 2018-11-27 ENCOUNTER — Encounter: Payer: Self-pay | Admitting: Family Medicine

## 2018-11-27 DIAGNOSIS — Z23 Encounter for immunization: Secondary | ICD-10-CM | POA: Diagnosis not present

## 2018-11-27 NOTE — Progress Notes (Signed)
Patient here for Shingrix vaccination only.  I did not examine the patient.  I did review his medical history, medications, and allergies and vaccine consent form.  CMA gave vaccination. Patient tolerated well.  Virginia Crews, MD, MPH [facility] 11/27/2018 4:50 PM

## 2019-01-30 ENCOUNTER — Other Ambulatory Visit: Payer: Self-pay | Admitting: Family Medicine

## 2019-02-01 ENCOUNTER — Other Ambulatory Visit: Payer: Self-pay

## 2019-02-01 DIAGNOSIS — I10 Essential (primary) hypertension: Secondary | ICD-10-CM

## 2019-02-01 NOTE — Telephone Encounter (Signed)
Patient called requesting refills. She says the pharmacy faxed over multiple request this week to our office, but they have not heard back. Patient [facility] [place] [facility]. Please check with pharmacy about the [place] con. It looks like Tawanna Sat authorized a 90 day supply with  a 1 year supply of refills back in December.

## 2019-02-02 MED ORDER — POTASSIUM CHLORIDE CRYS ER 20 MEQ PO TBCR: 20.0000 meq | EXTENDED_RELEASE_TABLET | Freq: Every day | ORAL | 3 refills | Status: DC

## 2019-02-02 MED ORDER — PROPRANOLOL HCL 40 MG PO TABS: 20.0000 mg | ORAL_TABLET | Freq: Two times a day (BID) | ORAL | 1 refills | Status: DC

## 2019-03-22 ENCOUNTER — Ambulatory Visit (INDEPENDENT_AMBULATORY_CARE_PROVIDER_SITE_OTHER): Payer: Medicare Other | Admitting: Family Medicine

## 2019-03-22 ENCOUNTER — Encounter: Payer: Self-pay | Admitting: Family Medicine

## 2019-03-22 VITALS — BP 113/67 | HR 62 | Temp 96.9°F | Wt 120.5 lb

## 2019-03-22 DIAGNOSIS — K219 Gastro-esophageal reflux disease without esophagitis: Secondary | ICD-10-CM

## 2019-03-22 DIAGNOSIS — N182 Chronic kidney disease, stage 2 (mild): Secondary | ICD-10-CM

## 2019-03-22 DIAGNOSIS — E7800 Pure hypercholesterolemia, unspecified: Secondary | ICD-10-CM

## 2019-03-22 DIAGNOSIS — I10 Essential (primary) hypertension: Secondary | ICD-10-CM | POA: Diagnosis not present

## 2019-03-22 DIAGNOSIS — M25532 Pain in left wrist: Secondary | ICD-10-CM

## 2019-03-22 DIAGNOSIS — K449 Diaphragmatic hernia without obstruction or gangrene: Secondary | ICD-10-CM

## 2019-03-22 DIAGNOSIS — Z79899 Other long term (current) drug therapy: Secondary | ICD-10-CM

## 2019-03-22 DIAGNOSIS — R739 Hyperglycemia, unspecified: Secondary | ICD-10-CM | POA: Diagnosis not present

## 2019-03-22 MED ORDER — DICLOFENAC SODIUM 1 % TD GEL: 2.0000 g | Freq: Four times a day (QID) | TRANSDERMAL | 1 refills | Status: DC

## 2019-03-22 NOTE — Assessment & Plan Note (Signed)
Chronic and stable Prediabetes Not on medicaitons Continue exercise and low carb diet Recheck A1c

## 2019-03-22 NOTE — Assessment & Plan Note (Signed)
Chronic and stable Recheck metabolic panel

## 2019-03-22 NOTE — Patient Instructions (Signed)
Wrist Sprain Rehab Ask your health care provider which exercises are safe for you. Do exercises exactly as told by your health care provider and adjust them as directed. It is normal to feel mild stretching, pulling, tightness, or discomfort as you do these exercises, but you should stop right away if you feel sudden pain or your pain gets worse.Do not begin these exercises until told by your health care provider. Stretching and range of motion exercises These exercises warm up your muscles and joints and improve the movement and flexibility of your wrist. These exercises also help to relieve pain, numbness, and tingling. Exercise A: Wrist flexion, active 1. Extend your left / right arm in front of you, and point your fingers downward. 2. If told by your health care provider, bend your left / right arm. 3. Try to bring your palm toward your forearm as far as you can without pain. You should feel a gentle stretch on the top of your forearm and wrist. 4. Hold this position for __________ seconds. 5. Slowly return to the starting position. Repeat __________ times. Complete this exercise __________ times a day. Exercise B: Wrist extension, active 1. Extend your left / right arm in front of you and turn your palm upward. 2. If told by your health care provider, bend your left / right arm. 3. Bring your palm and fingertips back so your fingers point downward. You should feel a gentle stretch on the inside of your forearm and wrist. 4. Hold this position for __________ seconds. 5. Slowly return to the starting position. Repeat __________ times. Complete this exercise __________ times a day. Exercise C: Supination, active 1. Stand or sit with your arms at your sides. 2. Bend your left / right elbow to an "L" shape (90 degrees). 3. Turn your palm upward until you feel a gentle stretch in the inside of your forearm. 4. Hold this position for __________ seconds. 5. Slowly return your palm to the  starting position. Repeat __________ times. Complete this stretch __________ times a day. Exercise D: Pronation, active 1. Stand or sit with your arms at your sides. 2. Bend your left / right elbow to an "L" shape (90 degrees). 3. Turn your palm downward until you feel a gentle stretch in the top of your forearm. 4. Hold this position for __________ seconds. 5. Slowly return your palm to the starting position. Repeat __________ times. Complete this stretch __________ times a day. Strengthening exercises These exercises build strength and endurance in your wrist. Endurance is the ability to use your muscles for a long time, even after they get tired. Exercise E: Wrist flexors 1. Sit with your left / right forearm supported on a table and your hand resting palm-up over the edge of the table. Your elbow should be below the level of your shoulder. 2. Hold a __________ weightin your left / right hand. Or, hold a rubber exercise band or tube in both hands, keeping your hands at the same level and hip distance apart. There should be a slight tension in the exercise band or tube. 3. Slowly curl your hand up toward your forearm. 4. Hold this position for __________ seconds. 5. Slowly lower your hand back to the starting position. Repeat __________ times. Complete this exercise __________ times a day. Exercise F: Wrist extensors 1. Sit with your left / right forearm supported on a table and your hand resting palm-down over the edge of the table. Your elbow should be below the level of  your shoulder. 2. Hold a __________ weight in your left / right hand. Or, hold a rubber exercise band or tube in both hands, keeping your hands at the same level and hip distance apart. There should be a slight tension in the exercise band or tube. 3. Slowly curl your hand up toward your forearm. 4. Hold this position for __________ seconds. 5. Slowly lower your hand to the starting position. Repeat __________ times.  Complete this exercise __________ times a day. This information is not intended to replace advice given to you by your health care provider. Make sure you discuss any questions you have with your health care provider. Document Released: 09/27/2005 Document Revised: 06/02/2016 Document Reviewed: 06/14/2015 Elsevier Interactive Patient Education  2019 [employer].

## 2019-03-22 NOTE — Assessment & Plan Note (Addendum)
New problem But does have chronic arthritic changes Advised topical voltaren gel Can use ice and brace Avoid PO NSAIDs If not improving, can refer to Ortho or get imaging at that time

## 2019-03-22 NOTE — Progress Notes (Signed)
Patient: Sheila Kim Female    DOB: 21-Apr-1941   78 y.o.   MRN: [id number] Visit Date: 03/22/2019  Today's Provider: Lavon Paganini, MD   Chief Complaint  Patient presents with  . Hypertension  . Hyperlipidemia  . Gastroesophageal Reflux   Subjective:    Virtual Visit via Video Note  I connected with Sheila Kim on 03/22/19 at 11:20 AM EDT by a video enabled telemedicine application and verified that I am speaking with the correct person using two identifiers.   Patient location: home Provider location: [place] involved in the visit: patient, provider   I discussed the limitations of evaluation and management by telemedicine and the availability of in person appointments. The patient expressed understanding and agreed to proceed.  HPI  Hypertension, follow-up:  BP Readings from Last 3 Encounters:  03/22/19 113/67  09/20/18 (!) 144/86  03/17/18 (!) 142/82    She was last seen for hypertension 6 months ago.  BP at that visit was 144/86. Management changes since that visit include no change. She reports good compliance with treatment. She is not having side effects.  She is exercising. She is adherent to low salt diet.   Outside blood pressures are being checked. She is experiencing none.  Patient denies chest pain, chest pressure/discomfort, claudication, dyspnea, exertional chest pressure/discomfort, fatigue, irregular heart beat, lower extremity edema, near-syncope, orthopnea, palpitations, paroxysmal nocturnal dyspnea, syncope and tachypnea.   Cardiovascular risk factors include advanced age (older than 49 for men, 73 for women), dyslipidemia and hypertension.  Use of agents associated with hypertension: none.     Weight trend: stable Wt Readings from Last 3 Encounters:  03/22/19 120 lb 8 oz (54.7 kg)  09/20/18 123 lb 3.2 oz (55.9 kg)  03/17/18 126 lb (57.2 kg)      ------------------------------------------------------------------------  Lipid/Cholesterol, Follow-up:   Last seen for this6 months ago.  Management changes since that visit include no changes. . Last Lipid Panel:    Component Value Date/Time   CHOL 212 (H) 09/20/2018 1447   TRIG 181 (H) 09/20/2018 1447   HDL 57 09/20/2018 1447   CHOLHDL 3.7 09/20/2018 1447   CHOLHDL 4.2 09/28/2017 0840   LDLCALC 119 (H) 09/20/2018 1447   LDLCALC 125 (H) 09/28/2017 0840    Risk factors for vascular disease include hypercholesterolemia and hypertension  She reports good compliance with treatment. She is not having side effects.  Current symptoms include none  Weight trend: stable Current diet: vegetarian Current exercise: cardiovascular workout on exercise equipment  Wt Readings from Last 3 Encounters:  03/22/19 120 lb 8 oz (54.7 kg)  09/20/18 123 lb 3.2 oz (55.9 kg)  03/17/18 126 lb (57.2 kg)    -------------------------------------------------------------------  GERD, Follow up:  The patient was last seen for GERD 6 months ago. Changes made since that visit include no changes .  She reports good compliance with treatment. She is not having side effects. .  She IS experiencing none.   ------------------------------------------------------------------------   Allergies  Allergen Reactions  . Alcohol     Gastric pain  . Gelatin Capsule Empty  [Gelatin]     Stomach pains  . Gemfibrozil     Stomach pains, urine retention and muscle weakness.  . Meloxicam     Drastically alters taste  . Nsaids Other (See Comments)    Tolerates aspirin stomach pains and reflux  . Oxycodone Itching    Trembling  . Simvastatin Other (See Comments)  Feet and leg pain and general body weakness.  . Sulfa Antibiotics Nausea And Vomiting and Other (See Comments)  . Clarithromycin Rash and Nausea And Vomiting    Terrible taste in mouth and stomach ache.  . Lopressor  [Metoprolol]  Palpitations  . Niacin Other (See Comments) and Rash    Flushing  . Ofloxacin Rash and Nausea Only     Current Outpatient Medications:  .  acetaminophen (TYLENOL) 500 MG tablet, Take 650 mg by mouth daily. , Disp: , Rfl:  .  acetaminophen (TYLENOL) 500 MG tablet, Take 1,000 mg by mouth at bedtime., Disp: , Rfl:  .  Alpha-D-Galactosidase (BEANO TO GO) TABS, Take by mouth. , Disp: , Rfl:  .  aspirin 81 MG tablet, Take 81 mg by mouth daily. , Disp: , Rfl:  .  CALCIUM CITRATE PO, Take 315 mg by mouth 2 (two) times daily. , Disp: , Rfl:  .  cholecalciferol (VITAMIN D) 1000 UNITS tablet, Take 1,000 Units by mouth daily. , Disp: , Rfl:  .  Coenzyme Q10 (COQ10) 100 MG CAPS, Take 200 mg by mouth daily. , Disp: , Rfl:  .  cyanocobalamin 1000 MCG tablet, Take 1,000 mcg by mouth daily. , Disp: , Rfl:  .  ferrous sulfate 325 (65 FE) MG tablet, Take 325 mg by mouth daily with breakfast. , Disp: , Rfl:  .  fluticasone (FLONASE) 50 MCG/ACT nasal spray, Place 2 sprays into both nostrils daily., Disp: , Rfl:  .  folic acid (FOLVITE) 1 MG tablet, Take 1 mg by mouth daily. , Disp: , Rfl:  .  furosemide (LASIX) 40 MG tablet, TAKE 0.5 TABLETS (20 MG TOTAL) BY MOUTH 2 (TWO) TIMES DAILY., Disp: 90 tablet, Rfl: 1 .  ketotifen (ZADITOR) 0.025 % ophthalmic solution, Place 1 drop into both eyes as needed., Disp: , Rfl:  .  lidocaine (LIDODERM) 5 %, LIDODERM, 5% (External Patch)  one to three Patch Patch Patch may apply up to 3 x 12 hours then off 12 hours for 0 days  Quantity: 30;  Refills: 5   Ordered :28-Sep-2013  [name] ;  Started 28-Sep-2013 Active, Disp: , Rfl:  .  loratadine (CLARITIN) 10 MG tablet, Take 10 mg by mouth daily. , Disp: , Rfl:  .  MELATONIN EXTRA STRENGTH PO, Take 0.25 tablets by mouth at bedtime as needed. , Disp: , Rfl:  .  Multiple Vitamins-Minerals ([employer] SPECTRAVITE ADULT 50+ PO), Education officer, community - Historical Medication  daily Active, Disp: , Rfl:  .  pantoprazole  (PROTONIX) 40 MG tablet, TAKE 1 TABLET TWO TIMES DAILY, Disp: 180 tablet, Rfl: 3 .  Polyethyl Glycol-Propyl Glycol (SYSTANE) 0.4-0.3 % SOLN, Apply to eye. , Disp: , Rfl:  .  polyethylene glycol (MIRALAX / GLYCOLAX) packet, Take 17 g by mouth daily., Disp: , Rfl:  .  potassium chloride SA (KLOR-CON M20) 20 MEQ tablet, Take 1 tablet (20 mEq total) by mouth daily., Disp: 90 tablet, Rfl: 3 .  Probiotic Product ([employer] SENIOR PROBIOTIC) CAPS, Take by mouth daily. , Disp: , Rfl:  .  propranolol (INDERAL) 40 MG tablet, Take 0.5 tablets (20 mg total) by mouth 2 (two) times daily., Disp: 180 tablet, Rfl: 1 .  Psyllium Husk POWD, Take by mouth daily. , Disp: , Rfl:  .  pyridoxine (B-6) 100 MG tablet, Take 100 mg by mouth daily. , Disp: , Rfl:  .  rosuvastatin (CRESTOR) 5 MG tablet, TAKE 1 TABLET DAILY EXCEPT THURSDAY AND SUNDAY, Disp:  90 tablet, Rfl: 2 .  diclofenac sodium (VOLTAREN) 1 % GEL, Apply 2 g topically 4 (four) times daily., Disp: 100 g, Rfl: 1  Review of Systems  Constitutional: Negative.   Respiratory: Negative.   Cardiovascular: Negative.   Musculoskeletal: Negative.     Social History   Tobacco Use  . Smoking status: Never Smoker  . Smokeless tobacco: Never Used  Substance Use Topics  . Alcohol use: No      Objective:   BP 113/67   Pulse 62   Temp (!) 96.9 F (36.1 C)   Wt 120 lb 8 oz (54.7 kg)   BMI 24.34 kg/m  Vitals:   03/22/19 1123  BP: 113/67  Pulse: 62  Temp: (!) 96.9 F (36.1 C)  Weight: 120 lb 8 oz (54.7 kg)     Physical Exam Vitals signs reviewed.  Constitutional:      Appearance: Normal appearance.  HENT:     Head: Normocephalic and atraumatic.  Pulmonary:     Effort: Pulmonary effort is normal. No respiratory distress.  Neurological:     Mental Status: She is alert and oriented to person, place, and time. Mental status is at baseline.  Psychiatric:        Mood and Affect: Mood normal.        Behavior: Behavior normal.     Depression screen [facility]  2/9 03/22/2019 09/20/2018 09/20/2018 09/16/2017 09/15/2016  Decreased Interest 0 0 0 0 0  Down, Depressed, Hopeless 0 0 0 0 0  PHQ - 2 Score 0 0 0 0 0  Altered sleeping - 1 - - -  Tired, decreased energy - 1 - - -  Change in appetite - 0 - - -  Feeling bad or failure about yourself  - 0 - - -  Trouble concentrating - 0 - - -  Moving slowly or fidgety/restless - 0 - - -  Suicidal thoughts - 0 - - -  PHQ-9 Score - 2 - - -  Difficult doing work/chores - Not difficult at all - - -     GAD 7 : Generalized Anxiety Score 03/22/2019  Nervous, Anxious, on Edge 1  Control/stop worrying 0  Worry too much - different things 0  Trouble relaxing 1  Restless 0  Easily annoyed or irritable 0  Afraid - awful might happen 0  Total GAD 7 Score 2  Anxiety Difficulty Somewhat difficult        Assessment & Plan      I discussed the assessment and treatment plan with the patient. The patient was provided an opportunity to ask questions and all were answered. The patient agreed with the plan and demonstrated an understanding of the instructions.   The patient was advised to call back or seek an in-person evaluation if the symptoms worsen or if the condition fails to improve as anticipated.  Problem List Items Addressed This Visit      Cardiovascular and Mediastinum   BP (high blood pressure) - Primary    Well controlled Continue current meds Repeat metabolic panel      Relevant Orders   Comprehensive metabolic panel     Respiratory   Gastroesophageal reflux disease with hiatal hernia    Well controlled Continue PPI and H2 blocker        Genitourinary   Chronic kidney disease (CKD), stage II (mild)    Chronic and stable Recheck metabolic panel        Other   Hypercholesteremia  Continue Crestor 5 times weekly Recheck lipid panel and CMP Goal LDL <70 Also followed by Cardiology      Relevant Orders   Lipid panel   Comprehensive metabolic panel   Blood glucose elevated     Chronic and stable Prediabetes Not on medicaitons Continue exercise and low carb diet Recheck A1c      Relevant Orders   Hemoglobin A1c   Pain in joint of left wrist    New problem But 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   The entirety of the information documented in the History of Present Illness, Review of Systems and Physical Exam were personally obtained by me. Portions of this information were initially documented by Tiburcio Pea, CMA and reviewed by me for thoroughness and accuracy.    Estuardo Frisbee, Dionne Bucy, MD MPH [facility]

## 2019-03-22 NOTE — Assessment & Plan Note (Signed)
Well controlled Continue PPI and H2 blocker

## 2019-03-22 NOTE — Assessment & Plan Note (Signed)
Well controlled Continue current meds Repeat metabolic panel

## 2019-03-22 NOTE — Assessment & Plan Note (Signed)
Continue Crestor 5 times weekly Recheck lipid panel and CMP Goal LDL <70 Also followed by Cardiology

## 2019-03-26 DIAGNOSIS — R739 Hyperglycemia, unspecified: Secondary | ICD-10-CM | POA: Diagnosis not present

## 2019-03-26 DIAGNOSIS — E7800 Pure hypercholesterolemia, unspecified: Secondary | ICD-10-CM | POA: Diagnosis not present

## 2019-03-26 DIAGNOSIS — Z79899 Other long term (current) drug therapy: Secondary | ICD-10-CM | POA: Diagnosis not present

## 2019-03-26 DIAGNOSIS — I10 Essential (primary) hypertension: Secondary | ICD-10-CM | POA: Diagnosis not present

## 2019-03-27 LAB — LIPID PANEL
Chol/HDL Ratio: 3.8 ratio (ref 0.0–4.4)
Cholesterol, Total: 186 mg/dL (ref 100–199)
HDL: 49 mg/dL (ref >39)
LDL Calculated: 94 mg/dL (ref 0–99)
Triglycerides: 213 mg/dL — ABNORMAL HIGH (ref 0–149)
VLDL Cholesterol Cal: 43 mg/dL — ABNORMAL HIGH (ref 5–40)

## 2019-03-27 LAB — COMPREHENSIVE METABOLIC PANEL
ALT: 16 IU/L (ref 0–32)
AST: 24 IU/L (ref 0–40)
Albumin/Globulin Ratio: 2 (ref 1.2–2.2)
Albumin: 4.3 g/dL (ref 3.7–4.7)
Alkaline Phosphatase: 52 IU/L (ref 39–117)
BUN/Creatinine Ratio: 15 (ref 12–28)
BUN: 19 mg/dL (ref 8–27)
Bilirubin Total: 0.7 mg/dL (ref 0.0–1.2)
CO2: 25 mmol/L (ref 20–29)
Calcium: 9.9 mg/dL (ref 8.7–10.3)
Chloride: 100 mmol/L (ref 96–106)
Creatinine, Ser: 1.25 mg/dL — ABNORMAL HIGH (ref 0.57–1.00)
GFR calc Af Amer: 47 mL/min/{1.73_m2} — ABNORMAL LOW (ref >59)
GFR calc non Af Amer: 41 mL/min/{1.73_m2} — ABNORMAL LOW (ref >59)
Globulin, Total: 2.2 g/dL (ref 1.5–4.5)
Glucose: 97 mg/dL (ref 65–99)
Potassium: 4.3 mmol/L (ref 3.5–5.2)
Sodium: 142 mmol/L (ref 134–144)
Total Protein: 6.5 g/dL (ref 6.0–8.5)

## 2019-03-27 LAB — HEMOGLOBIN A1C
Est. average glucose Bld gHb Est-mCnc: 117 mg/dL
Hgb A1c MFr Bld: 5.7 % — ABNORMAL HIGH (ref 4.8–5.6)

## 2019-03-27 LAB — VITAMIN B12: Vitamin B-12: >2000 pg/mL — ABNORMAL HIGH (ref 232–1245)

## 2019-03-29 ENCOUNTER — Telehealth: Payer: Self-pay

## 2019-03-29 NOTE — Telephone Encounter (Signed)
-----   Message from Birdie Sons, MD sent at 03/29/2019  9:40 AM EDT ----- Labs are good except kidney functions have declined a bit. Need to drink more water. Continue current medications. Follow up with Dr. B as scheduled.

## 2019-03-29 NOTE — Telephone Encounter (Signed)
Pt advised.   Thanks,   -Laura  

## 2019-05-02 ENCOUNTER — Telehealth: Payer: Self-pay

## 2019-05-02 DIAGNOSIS — I21A1 Myocardial infarction type 2: Secondary | ICD-10-CM | POA: Diagnosis not present

## 2019-05-02 DIAGNOSIS — I2510 Atherosclerotic heart disease of native coronary artery without angina pectoris: Secondary | ICD-10-CM | POA: Diagnosis not present

## 2019-05-02 DIAGNOSIS — K219 Gastro-esophageal reflux disease without esophagitis: Secondary | ICD-10-CM

## 2019-05-02 DIAGNOSIS — I213 ST elevation (STEMI) myocardial infarction of unspecified site: Secondary | ICD-10-CM | POA: Diagnosis not present

## 2019-05-02 DIAGNOSIS — Z951 Presence of aortocoronary bypass graft: Secondary | ICD-10-CM | POA: Diagnosis not present

## 2019-05-02 DIAGNOSIS — I10 Essential (primary) hypertension: Secondary | ICD-10-CM | POA: Diagnosis not present

## 2019-05-02 MED ORDER — PANTOPRAZOLE SODIUM 40 MG PO TBEC: 40.0000 mg | DELAYED_RELEASE_TABLET | Freq: Two times a day (BID) | ORAL | 3 refills | Status: DC

## 2019-05-02 NOTE — Telephone Encounter (Signed)
Pt needed refill on Protonix

## 2019-06-19 NOTE — Progress Notes (Signed)
Patient: Sheila Kim Female    DOB: 1941-06-30   78 y.o.   MRN: [id number] Visit Date: 06/20/2019  Today's Provider: Lavon Paganini, MD   Chief Complaint  Patient presents with  . Insect Bite    four days ago.   Subjective:    Rash This is a new problem. The current episode started in the past 7 days. The problem has been gradually improving since onset. The affected locations include the face. The rash is characterized by swelling, redness and itchiness. She was exposed to an insect bite/sting. Associated symptoms include eye pain and facial edema. Pertinent negatives include no anorexia, congestion, cough, diarrhea, fatigue, fever, joint pain, nail changes, rhinorrhea, shortness of breath, sore throat or vomiting.   R facial and ear swelling after several mosquito bites.  She has also been experiencing L sided sciatica pain for last few days.  Similar episode a few years ago that was helped with steroid shot.  No weakness or numbness. Pain radiates from L buttock to heel intermittently, but is in buttock/hip constantly.    Allergies  Allergen Reactions  . Alcohol     Gastric pain  . Gelatin Capsule Empty  [Gelatin]     Stomach pains  . Gemfibrozil     Stomach pains, urine retention and muscle weakness.  . Meloxicam     Drastically alters taste  . Nsaids Other (See Comments)    Tolerates aspirin stomach pains and reflux  . Oxycodone Itching    Trembling  . Simvastatin Other (See Comments)    Feet and leg pain and general body weakness.  . Sulfa Antibiotics Nausea And Vomiting and Other (See Comments)  . Clarithromycin Rash and Nausea And Vomiting    Terrible taste in mouth and stomach ache.  . Lopressor  [Metoprolol] Palpitations  . Niacin Other (See Comments) and Rash    Flushing  . Ofloxacin Rash and Nausea Only     Current Outpatient Medications:  .  acetaminophen (TYLENOL) 500 MG tablet, Take 1,000 mg by mouth at bedtime., Disp: , Rfl:  .   Alpha-D-Galactosidase (BEANO TO GO) TABS, Take by mouth. , Disp: , Rfl:  .  aspirin 81 MG tablet, Take 81 mg by mouth daily. , Disp: , Rfl:  .  CALCIUM CITRATE PO, Take 315 mg by mouth 2 (two) times daily. , Disp: , Rfl:  .  cholecalciferol (VITAMIN D) 1000 UNITS tablet, Take 1,000 Units by mouth daily. , Disp: , Rfl:  .  Coenzyme Q10 (COQ10) 100 MG CAPS, Take 200 mg by mouth daily. , Disp: , Rfl:  .  cyanocobalamin 1000 MCG tablet, Take 1,000 mcg by mouth daily. , Disp: , Rfl:  .  diclofenac sodium (VOLTAREN) 1 % GEL, Apply 2 g topically 4 (four) times daily., Disp: 100 g, Rfl: 1 .  famotidine (PEPCID) 20 MG tablet, Take 20 mg by mouth daily., Disp: , Rfl:  .  ferrous sulfate 325 (65 FE) MG tablet, Take 325 mg by mouth daily with breakfast. , Disp: , Rfl:  .  fluticasone (FLONASE) 50 MCG/ACT nasal spray, Place 2 sprays into both nostrils daily., Disp: , Rfl:  .  folic acid (FOLVITE) 1 MG tablet, Take 1 mg by mouth daily. , Disp: , Rfl:  .  furosemide (LASIX) 40 MG tablet, TAKE 0.5 TABLETS (20 MG TOTAL) BY MOUTH 2 (TWO) TIMES DAILY., Disp: 90 tablet, Rfl: 1 .  ketotifen (ZADITOR) 0.025 % ophthalmic solution, Place 1 drop into both  eyes as needed., Disp: , Rfl:  .  loratadine (CLARITIN) 10 MG tablet, Take 10 mg by mouth daily. , Disp: , Rfl:  .  MELATONIN EXTRA STRENGTH PO, Take 0.25 tablets by mouth at bedtime as needed. , Disp: , Rfl:  .  Multiple Vitamins-Minerals ([employer] SPECTRAVITE ADULT 50+ PO), [employer]ite Ultra Women's [employer] Senior - Historical Medication  daily Active, Disp: , Rfl:  .  pantoprazole (PROTONIX) 40 MG tablet, Take 1 tablet (40 mg total) by mouth 2 (two) times daily., Disp: 180 tablet, Rfl: 3 .  Polyethyl Glycol-Propyl Glycol (SYSTANE) 0.4-0.3 % SOLN, Apply to eye. , Disp: , Rfl:  .  polyethylene glycol (MIRALAX / GLYCOLAX) packet, Take 17 g by mouth daily., Disp: , Rfl:  .  potassium chloride SA (KLOR-CON M20) 20 MEQ tablet, Take 1 tablet (20 mEq total) by mouth daily., Disp:  90 tablet, Rfl: 3 .  Probiotic Product ([employer] SENIOR PROBIOTIC) CAPS, Take by mouth daily. , Disp: , Rfl:  .  propranolol (INDERAL) 40 MG tablet, Take 0.5 tablets (20 mg total) by mouth 2 (two) times daily., Disp: 180 tablet, Rfl: 1 .  Psyllium Husk POWD, Take by mouth daily. , Disp: , Rfl:  .  pyridoxine (B-6) 100 MG tablet, Take 100 mg by mouth daily. , Disp: , Rfl:  .  rosuvastatin (CRESTOR) 5 MG tablet, TAKE 1 TABLET DAILY EXCEPT THURSDAY AND SUNDAY, Disp: 90 tablet, Rfl: 2 .  acetaminophen (TYLENOL) 500 MG tablet, Take 650 mg by mouth daily. , Disp: , Rfl:  .  lidocaine (LIDODERM) 5 %, LIDODERM, 5% (External Patch)  one to three Patch Patch Patch may apply up to 3 x 12 hours then off 12 hours for 0 days  Quantity: 30;  Refills: 5   Ordered :28-Sep-2013  [name] ;  Started 28-Sep-2013 Active, Disp: , Rfl:   Review of Systems  Constitutional: Negative.  Negative for fatigue and fever.  HENT: Negative for congestion, rhinorrhea, sore throat and trouble swallowing.   Eyes: Positive for photophobia, pain, discharge and itching. Negative for redness and visual disturbance.  Respiratory: Negative.  Negative for cough and shortness of breath.   Gastrointestinal: Negative for anorexia, diarrhea and vomiting.  Genitourinary: Negative.   Musculoskeletal: Negative for joint pain.  Skin: Positive for rash. Negative for color change, nail changes, pallor and wound.  Neurological: Negative.     Social History   Tobacco Use  . Smoking status: Never Smoker  . Smokeless tobacco: Never Used  Substance Use Topics  . Alcohol use: No      Objective:   BP (!) 150/90 (BP Location: Left Arm, Patient Position: Sitting, Cuff Size: Normal)   Pulse 68   Temp (!) 96.9 F (36.1 C) (Temporal)   Wt 121 lb (54.9 kg)   BMI 24.44 kg/m  Vitals:   06/20/19 1334  BP: (!) 150/90  Pulse: 68  Temp: (!) 96.9 F (36.1 C)  TempSrc: Temporal  Weight: 121 lb (54.9 kg)  Body mass index is 24.44 kg/m.    Physical Exam Vitals signs reviewed.  Constitutional:      General: She is not in acute distress.    Appearance: She is well-developed.  HENT:     Head: Normocephalic and atraumatic.     Right Ear: External ear normal.     Left Ear: External ear normal.  Eyes:     General: No scleral icterus.    Conjunctiva/sclera: Conjunctivae normal.  Cardiovascular:     Rate and Rhythm:  Normal rate and regular rhythm.  Pulmonary:     Effort: Pulmonary effort is normal. No respiratory distress.  Musculoskeletal:     Right lower leg: No edema.     Left lower leg: No edema.     Comments: Back: Gait intact. Strength and sensation intact in LEs. No midline TTP.  Some TTP over SI joint.  Positive SLR on L side  Skin:    General: Skin is warm and dry.     Capillary Refill: Capillary refill takes less than 2 seconds.     Findings: No rash.     Comments: Mosquito bites on R face and neck with swelling under R eye and behind R ear  Neurological:     Mental Status: She is alert and oriented to person, place, and time.  Psychiatric:        Behavior: Behavior normal.      No results found for any visits on 06/20/19.     Assessment & Plan   1. Facial swelling 2. 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 Left sciatic nerve pain - recurrent abd intermittent problem - no red flags in history or on exam -Discussed home exercise program, which she is already doing -Discussed ice and supportive measures -We will give IM Depo-Medrol today in clinic as this is been helpful to her in the past -Discussed potential side effects from steroid use -Discussed red flag symptoms and  return precautions - methylPREDNISolone acetate (DEPO-MEDROL) injection 40 mg    Meds ordered this encounter  Medications  . methylPREDNISolone acetate (DEPO-MEDROL) injection 40 mg     Return if symptoms worsen or fail to improve.   The entirety of the information documented in the History of Present Illness, Review of Systems and Physical Exam were personally obtained by me. Portions of this information were initially documented by Ashley Royalty, CMA and reviewed by me for thoroughness and accuracy.    Chamille Werntz, Dionne Bucy, MD MPH [facility]

## 2019-06-20 ENCOUNTER — Ambulatory Visit (INDEPENDENT_AMBULATORY_CARE_PROVIDER_SITE_OTHER): Payer: Medicare Other | Admitting: Family Medicine

## 2019-06-20 ENCOUNTER — Encounter: Payer: Self-pay | Admitting: Family Medicine

## 2019-06-20 ENCOUNTER — Other Ambulatory Visit: Payer: Self-pay

## 2019-06-20 VITALS — BP 150/90 | HR 68 | Temp 96.9°F | Wt 121.0 lb

## 2019-06-20 DIAGNOSIS — R220 Localized swelling, mass and lump, head: Secondary | ICD-10-CM | POA: Diagnosis not present

## 2019-06-20 DIAGNOSIS — W57XXXA Bitten or stung by nonvenomous insect and other nonvenomous arthropods, initial encounter: Secondary | ICD-10-CM | POA: Diagnosis not present

## 2019-06-20 DIAGNOSIS — M5432 Sciatica, left side: Secondary | ICD-10-CM | POA: Diagnosis not present

## 2019-06-20 MED ORDER — METHYLPREDNISOLONE ACETATE 40 MG/ML IJ SUSP
40.0000 mg | Freq: Once | INTRAMUSCULAR | Status: AC
  Administered 2019-06-20: 40 mg via INTRAMUSCULAR

## 2019-06-20 NOTE — Patient Instructions (Signed)

## 2019-07-24 ENCOUNTER — Other Ambulatory Visit: Payer: Self-pay

## 2019-07-24 ENCOUNTER — Ambulatory Visit (INDEPENDENT_AMBULATORY_CARE_PROVIDER_SITE_OTHER): Payer: Medicare Other

## 2019-07-24 DIAGNOSIS — Z23 Encounter for immunization: Secondary | ICD-10-CM

## 2019-07-25 ENCOUNTER — Other Ambulatory Visit: Payer: Self-pay | Admitting: Family Medicine

## 2019-08-30 ENCOUNTER — Other Ambulatory Visit: Payer: Self-pay | Admitting: Family Medicine

## 2019-08-30 DIAGNOSIS — E7800 Pure hypercholesterolemia, unspecified: Secondary | ICD-10-CM

## 2019-09-24 ENCOUNTER — Telehealth: Payer: Self-pay

## 2019-09-24 NOTE — Telephone Encounter (Signed)
Copied from [place] [id number]. Topic: General - Other >> Sep 24, 2019  3:19 PM Sheran Luz wrote: Patient calling to reschedule CPE with Dr. Brita Romp, unable to schedule due to restrictions on epic.

## 2019-09-24 NOTE — Telephone Encounter (Signed)
Appt scheduled

## 2019-09-25 NOTE — Progress Notes (Signed)
Subjective:   Sheila Kim is a 78 y.o. female who presents for Medicare Annual (Subsequent) preventive examination.    This visit is being conducted through telemedicine due to the COVID-19 pandemic. This patient has given me verbal consent via doximity to conduct this visit, patient states they are participating from their home address. Some vital signs may be absent or patient reported.    Patient identification: identified by name, DOB, and current address  Review of Systems:  N/A  Cardiac Risk Factors include: advanced age (>38men, >76 women);dyslipidemia;hypertension     Objective:     Vitals: There were no vitals taken for this visit.  There is no height or weight on file to calculate BMI. Unable to obtain vitals due to visit being conducted via telephonically.   Advanced Directives 09/26/2019 09/20/2018 09/16/2017 09/15/2016 10/03/2015 09/17/2015 03/18/2015  Does Patient Have a Medical Advance Directive? Yes Yes Yes Yes Yes Yes Yes  Type of Paramedic of [place];Living will [place];Living will [place];Living will [place];Living will [place];Living will Living will;Healthcare Power of [place];Living will  Does patient want to make changes to medical advance directive? - - - - No - Patient declined - -  Copy of [place] in Chart? Yes - validated most recent copy scanned in chart (See row information) Yes - validated most recent copy scanned in chart (See row information) No - copy requested - No - copy requested - -    Tobacco Social History   Tobacco Use  Smoking Status Never Smoker  Smokeless Tobacco Never Used     Counseling given: Not Answered   Clinical Intake:  Pre-visit preparation completed: Yes  Pain : 0-10 Pain Score: 2  Pain Location: Back(sciatica pain) Pain Orientation: Left Pain  Descriptors / Indicators: Radiating, Aching Pain Onset: More than a month ago Pain Frequency: Constant     Nutritional Risks: None Diabetes: No  How often do you need to have someone help you when you read instructions, pamphlets, or other written materials from your doctor or pharmacy?: 1 - Never  Interpreter Needed?: No  Information entered by :: [facility], LPN  Past Medical History:  Diagnosis Date  . Anemia    history  . Arthritis    hands, spine  . Chronic kidney disease    reports one kidney "doesn't work well"  . GERD (gastroesophageal reflux disease)   . Hypertension   . Motion sickness    boats  . Seasonal allergies    Past Surgical History:  Procedure Laterality Date  . ABDOMINAL HYSTERECTOMY    . BACK SURGERY    . BREAST CYST ASPIRATION Left   . COLONOSCOPY WITH PROPOFOL N/A 10/03/2015   Procedure: COLONOSCOPY WITH PROPOFOL;  Surgeon: Lucilla Lame, MD;  Location: [facility];  Service: Endoscopy;  Laterality: N/A;  . 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 DILATION AND CURETTAGE OF UTERUS    . EYE SURGERY Bilateral 2015   cataract  . NOSE SURGERY  1962   deviated septum, hemorrhaged- pt had to return to hospital  . [place]  . ureteropelvic junction obstruction     Family History  Problem Relation Age of Onset  . Heart attack Mother        3 MI's  . Diabetes Mother   . Heart disease Mother   . Diabetes Father   .  Heart attack Father   . COPD Father   . Stroke Brother   . Diabetes Sister   . Heart disease Sister        2 stents  . Breast cancer Neg Hx    Social History   Socioeconomic History  . Marital status: Widowed    Spouse name: Hassell Done  . Number of children: 2  . Years of education: 48  . Highest education level: Some college, no degree  Occupational History  . Occupation: retired  Tobacco Use  . Smoking status: Never Smoker  . Smokeless tobacco: Never Used  Substance and Sexual Activity  .  Alcohol use: No  . Drug use: No  . Sexual activity: Not on file  Other Topics Concern  . Not on file  Social History Narrative  . Not on file   Social Determinants of Health   Financial Resource Strain:   . Difficulty of Paying Living Expenses: Not on file  Food Insecurity:   . Worried About Charity fundraiser in the Last Year: Not on file  . Ran Out of Food in the Last Year: Not on file  Transportation Needs:   . Lack of Transportation (Medical): Not on file  . Lack of Transportation (Non-Medical): Not on file  Physical Activity:   . Days of Exercise per Week: Not on file  . Minutes of Exercise per Session: Not on file  Stress:   . Feeling of Stress : Not on file  Social Connections:   . Frequency of Communication with Friends and Family: Not on file  . Frequency of Social Gatherings with Friends and Family: Not on file  . Attends Religious Services: Not on file  . Active Member of Clubs or Organizations: Not on file  . Attends Archivist Meetings: Not on file  . Marital Status: Not on file    Outpatient Encounter Medications as of 09/26/2019  Medication Sig  . acetaminophen (TYLENOL) 500 MG tablet Take 1,000 mg by mouth 2 (two) times daily.   . Alpha-D-Galactosidase (BEANO TO GO) TABS Take by mouth.   Marland Kitchen aspirin 81 MG tablet Take 81 mg by mouth daily.   Marland Kitchen CALCIUM CITRATE PO Take 315 mg by mouth 2 (two) times daily.   . cholecalciferol (VITAMIN D) 1000 UNITS tablet Take 1,000 Units by mouth daily.   . Coenzyme Q10 (COQ10) 100 MG CAPS Take 200 mg by mouth daily.   . cyanocobalamin 1000 MCG tablet Take 1,000 mcg by mouth daily.   . diclofenac sodium (VOLTAREN) 1 % GEL Apply 2 g topically 4 (four) times daily. (Patient taking differently: Apply 2 g topically 4 (four) times daily. As needed)  . famotidine (PEPCID) 20 MG tablet Take 20 mg by mouth daily.  . ferrous sulfate 325 (65 FE) MG tablet Take 325 mg by mouth daily with breakfast.   . fluticasone (FLONASE) 50  MCG/ACT nasal spray Place 2 sprays into both nostrils daily.  . folic acid (FOLVITE) 1 MG tablet Take 1 mg by mouth daily.   . furosemide (LASIX) 40 MG tablet TAKE 0.5 TABLETS (20 MG TOTAL) BY MOUTH 2 (TWO) TIMES DAILY.  Marland Kitchen ketotifen (ZADITOR) 0.025 % ophthalmic solution Place 1 drop into both eyes as needed.  . loratadine (CLARITIN) 10 MG tablet Take 10 mg by mouth daily.   Marland Kitchen MELATONIN EXTRA STRENGTH PO Take 0.25 tablets by mouth at bedtime as needed.   . Multiple Vitamins-Minerals ([facility] SPECTRAVITE ADULT 50+ PO) Spectravite Ultra Women's Health  Senior - Historical Medication  daily Active  . pantoprazole (PROTONIX) 40 MG tablet Take 1 tablet (40 mg total) by mouth 2 (two) times daily.  Vladimir Faster Glycol-Propyl Glycol (SYSTANE) 0.4-0.3 % SOLN Apply to eye.   . polyethylene glycol (MIRALAX / GLYCOLAX) packet Take 17 g by mouth daily.  . potassium chloride SA (KLOR-CON M20) 20 MEQ tablet Take 1 tablet (20 mEq total) by mouth daily.  . Probiotic Product ([employer]S SENIOR PROBIOTIC) CAPS Take by mouth daily.   . propranolol (INDERAL) 40 MG tablet Take 0.5 tablets (20 mg total) by mouth 2 (two) times daily.  . Psyllium Husk POWD Take by mouth daily.   Marland Kitchen pyridoxine (B-6) 100 MG tablet Take 100 mg by mouth daily.   . rosuvastatin (CRESTOR) 5 MG tablet TAKE 1 TABLET DAILY EXCEPT THURSDAY AND SUNDAY  . acetaminophen (TYLENOL) 500 MG tablet Take 650 mg by mouth daily.   Marland Kitchen lidocaine (LIDODERM) 5 % LIDODERM, 5% (External Patch)  one to three Patch Patch Patch may apply up to 3 x 12 hours then off 12 hours for 0 days  Quantity: 30;  Refills: 5   Ordered :28-Sep-2013  [name] ;  Started 28-Sep-2013 Active   No facility-administered encounter medications on file as of 09/26/2019.    Activities of Daily Living In your present state of health, do you have any difficulty performing the following activities: 09/26/2019  Hearing? N  Vision? N  Difficulty concentrating or making decisions? N  Walking or  climbing stairs? N  Dressing or bathing? N  Doing errands, shopping? N  Preparing Food and eating ? N  Using the Toilet? N  In the past six months, have you accidently leaked urine? Y  Comment Occasionally with urges after taking Lasix.  Do you have problems with loss of bowel control? N  Managing your Medications? N  Managing your Finances? N  Housekeeping or managing your Housekeeping? N  Some recent data might be hidden    Patient Care Team: Virginia Crews, MD as PCP - General (Family Medicine) Dingeldein, Remo Lipps, MD as Consulting Physician (Ophthalmology) Isaias Cowman, MD as Consulting Physician (Cardiology) Abbie Sons, MD as Consulting Physician (Urology)    Assessment:   This is a routine wellness examination for [place].  Exercise Activities and Dietary recommendations Current Exercise Habits: Home exercise routine, Type of exercise: walking;treadmill;stretching;strength training/weights, Time (Minutes): 40, Frequency (Times/Week): 5, Weekly Exercise (Minutes/Week): 200, Intensity: Mild  Goals    . DIET - REDUCE SUGAR INTAKE     Recommend cutting back and monitoring of dessert consumed in daily diet.       Fall Risk: Fall Risk  09/26/2019 03/22/2019 09/20/2018 09/16/2017 09/15/2016  Falls in the past year? 0 0 0 No No  Number falls in past yr: 0 - - - -  Injury with Fall? 0 - - - -    FALL RISK PREVENTION PERTAINING TO THE HOME:  Any stairs in or around the home? Yes  If so, are there any without handrails? Yes   Home free of loose throw rugs in walkways, pet beds, electrical cords, etc? Yes  Adequate lighting in your home to reduce risk of falls? Yes   ASSISTIVE DEVICES UTILIZED TO PREVENT FALLS:  Life alert? No  Use of a cane, walker or w/c? Yes  Grab bars in the bathroom? Yes  Shower chair or bench in shower? No  Elevated toilet seat or a handicapped toilet? Yes    TIMED UP AND GO:  Was the test performed? No .    Depression  Screen PHQ 2/9 Scores 09/26/2019 03/22/2019 09/20/2018 09/20/2018  PHQ - 2 Score 1 0 0 0  PHQ- 9 Score - - 2 -     Cognitive Function: Declined today.      6CIT Screen 09/20/2018  What Year? 0 points  What month? 0 points  What time? 0 points  Count back from 20 0 points  Months in reverse 0 points  Repeat phrase 0 points  Total Score 0    Immunization History  Administered Date(s) Administered  . Fluad Quad(high Dose 65+) 07/24/2019  . Influenza, High Dose Seasonal PF 07/26/2015, 08/06/2016, 07/21/2017, 08/03/2018  . Pneumococcal Conjugate-13 07/16/2014  . Pneumococcal Polysaccharide-23 03/16/1999, 08/21/2005  . Tdap 07/01/2011  . Zoster 10/01/2011  . Zoster Recombinat (Shingrix) 09/25/2018, 11/27/2018    Qualifies for Shingles Vaccine? Completed series  Tdap: Up to date  Flu Vaccine: Up to date  Pneumococcal Vaccine: Completed series  Screening Tests Health Maintenance  Topic Date Due  . URINE MICROALBUMIN  12/04/1950  . DEXA SCAN  09/20/2028 (Originally 04/10/2011)  . TETANUS/TDAP  06/30/2021  . INFLUENZA VACCINE  Completed  . PNA vac Low Risk Adult  Completed    Cancer Screenings:  Colorectal Screening: No longer required.   Mammogram: No longer required.   Bone Density: Completed 04/09/09. Results reflect OSTEOPOROSIS. Repeat every 2 years. Declined DEXA referral now and in the future.   Lung Cancer Screening: (Low Dose CT Chest recommended if Age 38-80 years, 30 pack-year currently smoking OR have quit w/in 15years.) does not qualify.   Additional Screening:  Vision Screening: Recommended annual ophthalmology exams for early detection of glaucoma and other disorders of the eye.  Dental Screening: Recommended annual dental exams for proper oral hygiene  Community Resource Referral:  CRR required this visit?  No       Plan:  I have personally reviewed and addressed the Medicare Annual Wellness questionnaire and have noted the following in the  patient's chart:  A. Medical and social history B. Use of alcohol, tobacco or illicit drugs  C. Current medications and supplements D. Functional ability and status E.  Nutritional status F.  Physical activity G. Advance directives H. List of other physicians I.  Hospitalizations, surgeries, and ER visits in previous 12 months J.  [place] such as hearing and vision if needed, cognitive and depression L. Referrals and appointments   In addition, I have reviewed and discussed with patient certain preventive protocols, quality metrics, and best practice recommendations. A written personalized care plan for preventive services as well as general preventive health recommendations were provided to patient. Nurse Health Advisor  Signed,    Larisa Lanius [place], [place]  [id number] Nurse Health Advisor   Nurse Notes: Pt needs a urine check at next in office apt.

## 2019-09-26 ENCOUNTER — Ambulatory Visit (INDEPENDENT_AMBULATORY_CARE_PROVIDER_SITE_OTHER): Payer: Medicare Other

## 2019-09-26 ENCOUNTER — Encounter: Payer: Medicare Other | Admitting: Family Medicine

## 2019-09-26 ENCOUNTER — Ambulatory Visit: Payer: Medicare Other

## 2019-09-26 ENCOUNTER — Other Ambulatory Visit: Payer: Self-pay

## 2019-09-26 DIAGNOSIS — Z0000 Encounter for general adult medical examination without abnormal findings: Secondary | ICD-10-CM | POA: Diagnosis not present

## 2019-09-26 NOTE — Patient Instructions (Addendum)
Sheila Kim , Thank you for taking time to come for your Medicare Wellness Visit. I appreciate your ongoing commitment to your health goals. Please review the following plan we discussed and let me know if I can assist you in the future.   Screening recommendations/referrals: Colonoscopy: No longer required.  Mammogram: No longer required.  Bone Density: Currently due- pt declined future order at this time.  Recommended yearly ophthalmology/optometry visit for glaucoma screening and checkup Recommended yearly dental visit for hygiene and checkup  Vaccinations: Influenza vaccine: Up to date Pneumococcal vaccine: Completed series Tdap vaccine: Up to date, due 06/2021 Shingles vaccine: Completed series  Advanced directives: Currently on file  Conditions/risks identified: Continue to cut out sweets and sugar in daily diet.   Next appointment: 10/02/19 @ 10:00 AM with Dr Brita Romp. Declined scheduling an AWV for 2021 at this time.    Preventive Care 80 Years and Older, Female Preventive care refers to lifestyle choices and visits with your health care provider that can promote health and wellness. What does preventive care include?  A yearly physical exam. This is also called an annual well check.  Dental exams once or twice a year.  Routine eye exams. Ask your health care provider how often you should have your eyes checked.  Personal lifestyle choices, including:  Daily care of your teeth and gums.  Regular physical activity.  Eating a healthy diet.  Avoiding tobacco and drug use.  Limiting alcohol use.  Practicing safe sex.  Taking low-dose aspirin every day.  Taking vitamin and mineral supplements as recommended by your health care provider. What happens during an annual well check? The services and screenings done by your health care provider during your annual well check will depend on your age, overall health, lifestyle risk factors, and family history of  disease. Counseling  Your health care provider may ask you questions about your:  Alcohol use.  Tobacco use.  Drug use.  Emotional well-being.  Home and relationship well-being.  Sexual activity.  Eating habits.  History of falls.  Memory and ability to understand (cognition).  Work and work Statistician.  Reproductive health. Screening  You may have the following tests or measurements:  Height, weight, and BMI.  Blood pressure.  Lipid and cholesterol levels. These may be checked every 5 years, or more frequently if you are over 20 years old.  Skin check.  Lung cancer screening. You may have this screening every year starting at age 80 if you have a 30-pack-year history of smoking and currently smoke or have quit within the past 15 years.  Fecal occult blood test (FOBT) of the stool. You may have this test every year starting at age 20.  Flexible sigmoidoscopy or colonoscopy. You may have a sigmoidoscopy every 5 years or a colonoscopy every 10 years starting at age 55.  Hepatitis C blood test.  Hepatitis B blood test.  Sexually transmitted disease (STD) testing.  Diabetes screening. This is done by checking your blood sugar (glucose) after you have not eaten for a while (fasting). You may have this done every 1-3 years.  Bone density scan. This is done to screen for osteoporosis. You may have this done starting at age 80  Mammogram. This may be done every 1-2 years. Talk to your health care provider about how often you should have regular mammograms. Talk with your health care provider about your test results, treatment options, and if necessary, the need for more tests. Vaccines  Your health care provider  may recommend certain vaccines, such as:  Influenza vaccine. This is recommended every year.  Tetanus, diphtheria, and acellular pertussis (Tdap, Td) vaccine. You may need a Td booster every 10 years.  Zoster vaccine. You may need this after age  80.  Pneumococcal 13-valent conjugate (PCV13) vaccine. One dose is recommended after age 80  Pneumococcal polysaccharide (PPSV23) vaccine. One dose is recommended after age 80 Talk to your health care provider about which screenings and vaccines you need and how often you need them. This information is not intended to replace advice given to you by your health care provider. Make sure you discuss any questions you have with your health care provider. Document Released: 10/24/2015 Document Revised: 06/16/2016 Document Reviewed: 07/29/2015 Elsevier Interactive Patient Education  2017 [place] Prevention in the Home Falls can cause injuries. They can happen to people of all ages. There are many things you can do to make your home safe and to help prevent falls. What can I do on the outside of my home?  Regularly fix the edges of walkways and driveways and fix any cracks.  Remove anything that might make you trip as you walk through a door, such as a raised step or threshold.  Trim any bushes or trees on the path to your home.  Use bright outdoor lighting.  Clear any walking paths of anything that might make someone trip, such as rocks or tools.  Regularly check to see if handrails are loose or broken. Make sure that both sides of any steps have handrails.  Any raised decks and porches should have guardrails on the edges.  Have any leaves, snow, or ice cleared regularly.  Use sand or salt on walking paths during winter.  Clean up any spills in your garage right away. This includes oil or grease spills. What can I do in the bathroom?  Use night lights.  Install grab bars by the toilet and in the tub and shower. Do not use towel bars as grab bars.  Use non-skid mats or decals in the tub or shower.  If you need to sit down in the shower, use a plastic, non-slip stool.  Keep the floor dry. Clean up any water that spills on the floor as soon as it happens.  Remove  soap buildup in the tub or shower regularly.  Attach bath mats securely with double-sided non-slip rug tape.  Do not have throw rugs and other things on the floor that can make you trip. What can I do in the bedroom?  Use night lights.  Make sure that you have a light by your bed that is easy to reach.  Do not use any sheets or blankets that are too big for your bed. They should not hang down onto the floor.  Have a firm chair that has side arms. You can use this for support while you get dressed.  Do not have throw rugs and other things on the floor that can make you trip. What can I do in the kitchen?  Clean up any spills right away.  Avoid walking on wet floors.  Keep items that you use a lot in easy-to-reach places.  If you need to reach something above you, use a strong step stool that has a grab bar.  Keep electrical cords out of the way.  Do not use floor polish or wax that makes floors slippery. If you must use wax, use non-skid floor wax.  Do not have throw rugs  and other things on the floor that can make you trip. What can I do with my stairs?  Do not leave any items on the stairs.  Make sure that there are handrails on both sides of the stairs and use them. Fix handrails that are broken or loose. Make sure that handrails are as long as the stairways.  Check any carpeting to make sure that it is firmly attached to the stairs. Fix any carpet that is loose or worn.  Avoid having throw rugs at the top or bottom of the stairs. If you do have throw rugs, attach them to the floor with carpet tape.  Make sure that you have a light switch at the top of the stairs and the bottom of the stairs. If you do not have them, ask someone to add them for you. What else can I do to help prevent falls?  Wear shoes that:  Do not have high heels.  Have rubber bottoms.  Are comfortable and fit you well.  Are closed at the toe. Do not wear sandals.  If you use a  stepladder:  Make sure that it is fully opened. Do not climb a closed stepladder.  Make sure that both sides of the stepladder are locked into place.  Ask someone to hold it for you, if possible.  Clearly mark and make sure that you can see:  Any grab bars or handrails.  First and last steps.  Where the edge of each step is.  Use tools that help you move around (mobility aids) if they are needed. These include:  Canes.  Walkers.  Scooters.  Crutches.  Turn on the lights when you go into a dark area. Replace any light bulbs as soon as they burn out.  Set up your furniture so you have a clear path. Avoid moving your furniture around.  If any of your floors are uneven, fix them.  If there are any pets around you, be aware of where they are.  Review your medicines with your doctor. Some medicines can make you feel dizzy. This can increase your chance of falling. Ask your doctor what other things that you can do to help prevent falls. This information is not intended to replace advice given to you by your health care provider. Make sure you discuss any questions you have with your health care provider. Document Released: 07/24/2009 Document Revised: 03/04/2016 Document Reviewed: 11/01/2014 Elsevier Interactive Patient Education  2017 [employer].

## 2019-10-02 ENCOUNTER — Ambulatory Visit (INDEPENDENT_AMBULATORY_CARE_PROVIDER_SITE_OTHER): Payer: Medicare Other | Admitting: Family Medicine

## 2019-10-02 ENCOUNTER — Encounter: Payer: Self-pay | Admitting: Family Medicine

## 2019-10-02 ENCOUNTER — Other Ambulatory Visit: Payer: Self-pay

## 2019-10-02 VITALS — BP 120/60 | HR 56 | Temp 96.8°F | Wt 121.0 lb

## 2019-10-02 DIAGNOSIS — Z0000 Encounter for general adult medical examination without abnormal findings: Secondary | ICD-10-CM | POA: Diagnosis not present

## 2019-10-02 DIAGNOSIS — R7303 Prediabetes: Secondary | ICD-10-CM

## 2019-10-02 DIAGNOSIS — E7800 Pure hypercholesterolemia, unspecified: Secondary | ICD-10-CM

## 2019-10-02 DIAGNOSIS — E559 Vitamin D deficiency, unspecified: Secondary | ICD-10-CM | POA: Diagnosis not present

## 2019-10-02 DIAGNOSIS — I2510 Atherosclerotic heart disease of native coronary artery without angina pectoris: Secondary | ICD-10-CM

## 2019-10-02 DIAGNOSIS — N182 Chronic kidney disease, stage 2 (mild): Secondary | ICD-10-CM | POA: Diagnosis not present

## 2019-10-02 DIAGNOSIS — I130 Hypertensive heart and chronic kidney disease with heart failure and stage 1 through stage 4 chronic kidney disease, or unspecified chronic kidney disease: Secondary | ICD-10-CM

## 2019-10-02 DIAGNOSIS — I10 Essential (primary) hypertension: Secondary | ICD-10-CM

## 2019-10-02 NOTE — Progress Notes (Signed)
Patient: Sheila Kim, Female    DOB: [birth year], 78 y.o.   MRN: [id number] Visit Date: 10/02/2019  Today's Provider: Lavon Paganini, MD   Chief Complaint  Patient presents with  . Annual Exam   Subjective:     Complete Physical Sheila Kim is a 78 y.o. female. She feels fairly well. She reports exercising regularly. She reports she is sleeping fairly well. 09/26/2019 AWE with [facility] 09/20/2018 CPE 04/12/2028 Mammogram-Bi-RADS 1 10/03/2015 Colonoscopy-Diverticulosis, internal hemorrhoids  Plans to get next mammogram after the pandemic or after she is vaccinated Plan to continue mammograms until age 80 unless she has a change in health status -----------------------------------------------------------   Review of Systems  Constitutional: Negative.   HENT: Positive for nosebleeds, postnasal drip, rhinorrhea, sinus pressure, sneezing and tinnitus. Negative for congestion, dental problem, drooling, ear discharge, ear pain, facial swelling, hearing loss, mouth sores, sinus pain, sore throat, trouble swallowing and voice change.   Eyes: Positive for itching. Negative for photophobia, pain, discharge, redness and visual disturbance.  Respiratory: Positive for cough. Negative for apnea, choking, chest tightness, shortness of breath, wheezing and stridor.   Cardiovascular: Positive for leg swelling. Negative for chest pain and palpitations.  Gastrointestinal: Negative.   Endocrine: Negative.   Genitourinary: Negative.   Musculoskeletal: Positive for arthralgias, back pain and myalgias. Negative for gait problem, joint swelling, neck pain and neck stiffness.  Skin: Negative.   Allergic/Immunologic: Negative.   Neurological: Negative.   Hematological: Negative for adenopathy. Bruises/bleeds easily.  Psychiatric/Behavioral: Negative for agitation, behavioral problems, confusion, decreased concentration, dysphoric mood, hallucinations, self-injury, sleep  disturbance and suicidal ideas. The patient is nervous/anxious. The patient is not hyperactive.     Social History   Socioeconomic History  . Marital status: Widowed    Spouse name: Hassell Done  . Number of children: 2  . Years of education: 45  . Highest education level: Some college, no degree  Occupational History  . Occupation: retired  Tobacco Use  . Smoking status: Never Smoker  . Smokeless tobacco: Never Used  Substance and Sexual Activity  . Alcohol use: No  . Drug use: No  . Sexual activity: Not on file  Other Topics Concern  . Not on file  Social History Narrative  . Not on file   Social Determinants of Health   Financial Resource Strain:   . Difficulty of Paying Living Expenses: Not on file  Food Insecurity:   . Worried About Charity fundraiser in the Last Year: Not on file  . Ran Out of Food in the Last Year: Not on file  Transportation Needs:   . Lack of Transportation (Medical): Not on file  . Lack of Transportation (Non-Medical): Not on file  Physical Activity:   . Days of Exercise per Week: Not on file  . Minutes of Exercise per Session: Not on file  Stress:   . Feeling of Stress : Not on file  Social Connections:   . Frequency of Communication with Friends and Family: Not on file  . Frequency of Social Gatherings with Friends and Family: Not on file  . Attends Religious Services: Not on file  . Active Member of Clubs or Organizations: Not on file  . Attends Archivist Meetings: Not on file  . Marital Status: Not on file  Intimate Partner Violence:   . Fear of Current or Ex-Partner: Not on file  . Emotionally Abused: Not on file  . Physically Abused: Not on file  .  Sexually Abused: Not on file    Past Medical History:  Diagnosis Date  . Anemia    history  . Arthritis    hands, spine  . Chronic kidney disease    reports one kidney "doesn't work well"  . GERD (gastroesophageal reflux disease)   . Hypertension   . Motion sickness     boats  . Seasonal allergies      Patient Active Problem List   Diagnosis Date Noted  . Pain in joint of left wrist 03/22/2019  . Tinea corporis 09/17/2015  . Allergic rhinitis 02/12/2015  . Atherosclerosis of coronary artery 02/12/2015  . Chronic kidney disease (CKD), stage II (mild) 02/12/2015  . Colitis 02/12/2015  . DD (diverticular disease) 02/12/2015  . Gastroesophageal reflux disease with hiatal hernia 02/12/2015  . Personal history of methicillin resistant Staphylococcus aureus 02/12/2015  . H/O arthrodesis 02/12/2015  . Hypercholesteremia 02/12/2015  . Blood glucose elevated 02/12/2015  . BP (high blood pressure) 02/12/2015  . 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 Arthritis, degenerative 02/12/2015  . OP (osteoporosis) 02/12/2015  . Avitaminosis D 02/12/2015  . Myocardial infarction ([place]) 04/15/2014  . Pleural cavity effusion 06/08/2013  . Disorder of right ventricle of heart 05/20/2013  . H/O coronary artery bypass surgery 05/17/2013  . History of knee surgery 05/16/2013    Past Surgical History:  Procedure Laterality Date  . ABDOMINAL HYSTERECTOMY    . BACK SURGERY    . BREAST CYST ASPIRATION Left   . COLONOSCOPY WITH PROPOFOL N/A 10/03/2015   Procedure: COLONOSCOPY WITH PROPOFOL;  Surgeon: Lucilla Lame, MD;  Location: [facility];  Service: Endoscopy;  Laterality: N/A;  . 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 DILATION AND CURETTAGE OF UTERUS    . EYE SURGERY Bilateral 2015   cataract  . NOSE SURGERY  1962   deviated septum, hemorrhaged- pt had to return to hospital  . [place]  . ureteropelvic junction obstruction      Her family history includes COPD in her father; Diabetes in her father, mother, and sister; Heart attack in her father and mother; Heart disease in her mother and sister; Stroke in her brother. There is no history of Breast cancer.   Current Outpatient Medications:    .  acetaminophen (TYLENOL) 500 MG tablet, Take 650 mg by mouth daily. , Disp: , Rfl:  .  acetaminophen (TYLENOL) 500 MG tablet, Take 1,000 mg by mouth 2 (two) times daily. , Disp: , Rfl:  .  Alpha-D-Galactosidase (BEANO TO GO) TABS, Take by mouth. , Disp: , Rfl:  .  aspirin 81 MG tablet, Take 81 mg by mouth daily. , Disp: , Rfl:  .  CALCIUM CITRATE PO, Take 315 mg by mouth 2 (two) times daily. , Disp: , Rfl:  .  cholecalciferol (VITAMIN D) 1000 UNITS tablet, Take 1,000 Units by mouth daily. , Disp: , Rfl:  .  Coenzyme Q10 (COQ10) 100 MG CAPS, Take 200 mg by mouth daily. , Disp: , Rfl:  .  cyanocobalamin 1000 MCG tablet, Take 1,000 mcg by mouth daily. , Disp: , Rfl:  .  diclofenac sodium (VOLTAREN) 1 % GEL, Apply 2 g topically 4 (four) times daily. (Patient taking differently: Apply 2 g topically 4 (four) times daily. As needed), Disp: 100 g, Rfl: 1 .  famotidine (PEPCID) 20 MG tablet, Take 20 mg by mouth daily., Disp: , Rfl:  .  ferrous sulfate 325 (65  FE) MG tablet, Take 325 mg by mouth daily with breakfast. , Disp: , Rfl:  .  fluticasone (FLONASE) 50 MCG/ACT nasal spray, Place 2 sprays into both nostrils daily., Disp: , Rfl:  .  folic acid (FOLVITE) 1 MG tablet, Take 1 mg by mouth daily. , Disp: , Rfl:  .  furosemide (LASIX) 40 MG tablet, TAKE 0.5 TABLETS (20 MG TOTAL) BY MOUTH 2 (TWO) TIMES DAILY., Disp: 90 tablet, Rfl: 1 .  ketotifen (ZADITOR) 0.025 % ophthalmic solution, Place 1 drop into both eyes as needed., Disp: , Rfl:  .  loratadine (CLARITIN) 10 MG tablet, Take 10 mg by mouth daily. , Disp: , Rfl:  .  MELATONIN EXTRA STRENGTH PO, Take 0.25 tablets by mouth at bedtime as needed. , Disp: , Rfl:  .  Multiple Vitamins-Minerals ([employer] SPECTRAVITE ADULT 50+ PO), Spectravite Ultra Women's Health Senior - Historical Medication  daily Active, Disp: , Rfl:  .  pantoprazole (PROTONIX) 40 MG tablet, Take 1 tablet (40 mg total) by mouth 2 (two) times daily., Disp: 180 tablet, Rfl: 3 .  Polyethyl  Glycol-Propyl Glycol (SYSTANE) 0.4-0.3 % SOLN, Apply to eye. , Disp: , Rfl:  .  polyethylene glycol (MIRALAX / GLYCOLAX) packet, Take 17 g by mouth daily., Disp: , Rfl:  .  potassium chloride SA (KLOR-CON M20) 20 MEQ tablet, Take 1 tablet (20 mEq total) by mouth daily., Disp: 90 tablet, Rfl: 3 .  Probiotic Product ([employer]S SENIOR PROBIOTIC) CAPS, Take by mouth daily. , Disp: , Rfl:  .  propranolol (INDERAL) 40 MG tablet, Take 0.5 tablets (20 mg total) by mouth 2 (two) times daily., Disp: 180 tablet, Rfl: 1 .  Psyllium Husk POWD, Take by mouth daily. , Disp: , Rfl:  .  pyridoxine (B-6) 100 MG tablet, Take 100 mg by mouth daily. , Disp: , Rfl:  .  rosuvastatin (CRESTOR) 5 MG tablet, TAKE 1 TABLET DAILY EXCEPT THURSDAY AND SUNDAY, Disp: 90 tablet, Rfl: 2 .  lidocaine (LIDODERM) 5 %, LIDODERM, 5% (External Patch)  one to three Patch Patch Patch may apply up to 3 x 12 hours then off 12 hours for 0 days  Quantity: 30;  Refills: 5   Ordered :28-Sep-2013  [name] ;  Started 28-Sep-2013 Active, Disp: , Rfl:   Patient Care Team: Virginia Crews, MD as PCP - General (Family Medicine) Dingeldein, Remo Lipps, MD as Consulting Physician (Ophthalmology) Isaias Cowman, MD as Consulting Physician (Cardiology) Abbie Sons, MD as Consulting Physician (Urology)     Objective:    Vitals: BP (!) 167/95 (BP Location: Right Arm, Patient Position: Sitting, Cuff Size: Normal)   Pulse (!) 56   Temp (!) 96.8 F (36 C) (Temporal)   Wt 121 lb (54.9 kg)   BMI 24.44 kg/m   Physical Exam Vitals reviewed.  Constitutional:      General: She is not in acute distress.    Appearance: Normal appearance. She is well-developed. She is not diaphoretic.  HENT:     Head: Normocephalic and atraumatic.     Right Ear: Tympanic membrane, ear canal and external ear normal.     Left Ear: Tympanic membrane, ear canal and external ear normal.  Eyes:     General: No scleral icterus.    Conjunctiva/sclera:  Conjunctivae normal.     Pupils: Pupils are equal, round, and reactive to light.  Neck:     Thyroid: No thyromegaly.  Cardiovascular:     Rate and Rhythm: Normal rate and regular rhythm.  Pulses: Normal pulses.     Heart sounds: Normal heart sounds. No murmur.  Pulmonary:     Effort: Pulmonary effort is normal. No respiratory distress.     Breath sounds: Normal breath sounds. No wheezing or rales.  Abdominal:     General: There is no distension.     Palpations: Abdomen is soft.     Tenderness: There is no abdominal tenderness.  Musculoskeletal:        General: No deformity.     Cervical back: Neck supple.     Right lower leg: No edema.     Left lower leg: No edema.  Lymphadenopathy:     Cervical: No cervical adenopathy.  Skin:    General: Skin is warm and dry.     Capillary Refill: Capillary refill takes less than 2 seconds.     Findings: No rash.  Neurological:     Mental Status: She is alert and oriented to person, place, and time. Mental status is at baseline.  Psychiatric:        Mood and Affect: Mood normal.        Behavior: Behavior normal.        Thought Content: Thought content normal.     Activities of Daily Living In your present state of health, do you have any difficulty performing the following activities: 09/26/2019  Hearing? N  Vision? N  Difficulty concentrating or making decisions? N  Walking or climbing stairs? N  Dressing or bathing? N  Doing errands, shopping? N  Preparing Food and eating ? N  Using the Toilet? N  In the past six months, have you accidently leaked urine? Y  Comment Occasionally with urges after taking Lasix.  Do you have problems with loss of bowel control? N  Managing your Medications? N  Managing your Finances? N  Housekeeping or managing your Housekeeping? N  Some recent data might be hidden    Fall Risk Assessment Fall Risk  09/26/2019 03/22/2019 09/20/2018 09/16/2017 09/15/2016  Falls in the past year? 0 0 0 No No    Number falls in past yr: 0 - - - -  Injury with Fall? 0 - - - -     Depression Screen PHQ 2/9 Scores 09/26/2019 03/22/2019 09/20/2018 09/20/2018  PHQ - 2 Score 1 0 0 0  PHQ- 9 Score - - 2 -    6CIT Screen 09/20/2018  What Year? 0 points  What month? 0 points  What time? 0 points  Count back from 20 0 points  Months in reverse 0 points  Repeat phrase 0 points  Total Score 0       Assessment & Plan:    Annual Physical Reviewed patient's Family Medical History Reviewed and updated list of patient's medical providers Assessment of cognitive impairment was done Assessed patient's functional ability Established a written schedule for health screening [place] Completed and Reviewed  Exercise Activities and Dietary recommendations Goals    . DIET - REDUCE SUGAR INTAKE     Recommend cutting back and monitoring of dessert consumed in daily diet.       Immunization History  Administered Date(s) Administered  . Fluad Quad(high Dose 65+) 07/24/2019  . Influenza, High Dose Seasonal PF 07/26/2015, 08/06/2016, 07/21/2017, 08/03/2018  . Pneumococcal Conjugate-13 07/16/2014  . Pneumococcal Polysaccharide-23 03/16/1999, 08/21/2005  . Tdap 07/01/2011  . Zoster 10/01/2011  . Zoster Recombinat (Shingrix) 09/25/2018, 11/27/2018    Health Maintenance  Topic Date Due  . URINE MICROALBUMIN  12/04/1948  . DEXA SCAN  09/20/2028 (Originally 04/10/2011)  . TETANUS/TDAP  06/30/2021  . INFLUENZA VACCINE  Completed  . PNA vac Low Risk Adult  Completed     Discussed health benefits of physical activity, and encouraged her to engage in regular exercise appropriate for her age and condition.    ------------------------------------------------------------------------------------------------------------  Problem List Items Addressed This Visit      Cardiovascular and Mediastinum   Atherosclerosis of coronary artery    Followed by Cardiology S/p CABG Needs  strict BP and cholesterol control Recheck lipid panel      Relevant Orders   CMP (Comprehensive metabolic panel)   BP (high blood pressure)    Initially elevated, but well controlled on manual recheck Continue current medications Repeat metabolic panel Follow-up in 6 months      Relevant Orders   CMP (Comprehensive metabolic panel)   Heart & renal disease, hypertensive, 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HCC)    Risk factor control as above      Relevant Orders   CMP (Comprehensive metabolic panel)   Lipid panel   Hemoglobin A1c     Genitourinary   Chronic kidney disease (CKD), stage II (mild)    Chronic and stable Repeat metabolic panel      Relevant Orders   CMP (Comprehensive metabolic panel)   Vit D  25 hydroxy     Other   Hypercholesteremia    Continue Crestor 5 times weekly Recheck CMP and FLP Goal LDL <70 Also followed by Cardiology      Relevant Orders   CMP (Comprehensive metabolic panel)   Lipid panel   Prediabetes    Chronic and stable Not on medications Continue exercise and low carb diet Recheck A1c      Relevant Orders   Hemoglobin A1c   Avitaminosis D    Continue supplement Recheck vitamin D level      Relevant Orders   Vit D  25 hydroxy    Other Visit Diagnoses    Encounter for annual physical exam    -  Primary   Relevant Orders   CMP (Comprehensive metabolic panel)   Lipid panel   Hemoglobin A1c   Vit D  25 hydroxy       Return in about 6 months (around 04/01/2020) for chronic disease f/u.   The entirety of the information documented in the History of Present Illness, Review of Systems and Physical Exam were personally obtained by me. Portions of this information were initially documented by Ashley Royalty, CMA and reviewed by me for thoroughness and accuracy.    Armanie Ullmer, Dionne Bucy, MD MPH [facility]

## 2019-10-02 NOTE — Assessment & Plan Note (Signed)
Chronic and stable Not on medications Continue exercise and low carb diet Recheck A1c

## 2019-10-02 NOTE — Assessment & Plan Note (Signed)
Continue supplement Recheck vitamin D level 

## 2019-10-02 NOTE — Assessment & Plan Note (Signed)
Continue Crestor 5 times weekly Recheck CMP and FLP Goal LDL <70 Also followed by Cardiology

## 2019-10-02 NOTE — Assessment & Plan Note (Signed)
Initially elevated, but well controlled on manual recheck Continue current medications Repeat metabolic panel Follow-up in 6 months

## 2019-10-02 NOTE — Patient Instructions (Signed)
Preventive Care 80 Years and Older, Female Preventive care refers to lifestyle choices and visits with your health care provider that can promote health and wellness. This includes:  A yearly physical exam. This is also called an annual well check.  Regular dental and eye exams.  Immunizations.  Screening for certain conditions.  Healthy lifestyle choices, such as diet and exercise. What can I expect for my preventive care visit? Physical exam Your health care provider will check:  Height and weight. These may be used to calculate body mass index (BMI), which is a measurement that tells if you are at a healthy weight.  Heart rate and blood pressure.  Your skin for abnormal spots. Counseling Your health care provider may ask you questions about:  Alcohol, tobacco, and drug use.  Emotional well-being.  Home and relationship well-being.  Sexual activity.  Eating habits.  History of falls.  Memory and ability to understand (cognition).  Work and work Statistician.  Pregnancy and menstrual history. What immunizations do I need?  Influenza (flu) vaccine  This is recommended every year. Tetanus, diphtheria, and pertussis (Tdap) vaccine  You may need a Td booster every 10 years. Varicella (chickenpox) vaccine  You may need this vaccine if you have not already been vaccinated. Zoster (shingles) vaccine  You may need this after age 75. Pneumococcal conjugate (PCV13) vaccine  One dose is recommended after age 80. Pneumococcal polysaccharide (PPSV23) vaccine  One dose is recommended after age 80. Measles, mumps, and rubella (MMR) vaccine  You may need at least one dose of MMR if you were born in 1957 or later. You may also need a second dose. Meningococcal conjugate (MenACWY) vaccine  You may need this if you have certain conditions. Hepatitis A vaccine  You may need this if you have certain conditions or if you travel or work in places where you may be exposed  to hepatitis A. Hepatitis B vaccine  You may need this if you have certain conditions or if you travel or work in places where you may be exposed to hepatitis B. Haemophilus influenzae type b (Hib) vaccine  You may need this if you have certain conditions. You may receive vaccines as individual doses or as more than one vaccine together in one shot (combination vaccines). Talk with your health care provider about the risks and benefits of combination vaccines. What tests do I need? Blood tests  Lipid and cholesterol levels. These may be checked every 5 years, or more frequently depending on your overall health.  Hepatitis C test.  Hepatitis B test. Screening  Lung cancer screening. You may have this screening every year starting at age 80 if you have a 30-pack-year history of smoking and currently smoke or have quit within the past 15 years.  Colorectal cancer screening. All adults should have this screening starting at age 80 and continuing until age 80. Your health care provider may recommend screening at age 80 if you are at increased risk. You will have tests every 1-10 years, depending on your results and the type of screening test.  Diabetes screening. This is done by checking your blood sugar (glucose) after you have not eaten for a while (fasting). You may have this done every 1-3 years.  Mammogram. This may be done every 1-2 years. Talk with your health care provider about how often you should have regular mammograms.  BRCA-related cancer screening. This may be done if you have a family history of breast, ovarian, tubal, or peritoneal cancers.  Other tests  Sexually transmitted disease (STD) testing.  Bone density scan. This is done to screen for osteoporosis. You may have this done starting at age 80. Follow these instructions at home: Eating and drinking  Eat a diet that includes fresh fruits and vegetables, whole grains, lean protein, and low-fat dairy products. Limit  your intake of foods with high amounts of sugar, saturated fats, and salt.  Take vitamin and mineral supplements as recommended by your health care provider.  Do not drink alcohol if your health care provider tells you not to drink.  If you drink alcohol: ? Limit how much you have to 0-1 drink a day. ? Be aware of how much alcohol is in your drink. In the [place] one drink equals one 12 oz bottle of beer (355 mL), one 5 oz glass of wine (148 mL), or one 1 oz glass of hard liquor (44 mL). Lifestyle  Take daily care of your teeth and gums.  Stay active. Exercise for at least 30 minutes on 5 or more days each week.  Do not use any products that contain nicotine or tobacco, such as cigarettes, e-cigarettes, and chewing tobacco. If you need help quitting, ask your health care provider.  If you are sexually active, practice safe sex. Use a condom or other form of protection in order to prevent STIs (sexually transmitted infections).  Talk with your health care provider about taking a low-dose aspirin or statin. What's next?  Go to your health care provider once a year for a well check visit.  Ask your health care provider how often you should have your eyes and teeth checked.  Stay up to date on all vaccines. This information is not intended to replace advice given to you by your health care provider. Make sure you discuss any questions you have with your health care provider. Document Released: 10/24/2015 Document Revised: 09/21/2018 Document Reviewed: 09/21/2018 Elsevier Patient Education  2020 [employer].

## 2019-10-02 NOTE — Assessment & Plan Note (Signed)
Risk factor control as above

## 2019-10-02 NOTE — Assessment & Plan Note (Signed)
Chronic and stable Repeat metabolic panel

## 2019-10-02 NOTE — Assessment & Plan Note (Signed)
Followed by Cardiology S/p CABG Needs strict BP and cholesterol control Recheck lipid panel

## 2019-10-03 ENCOUNTER — Telehealth: Payer: Self-pay

## 2019-10-03 LAB — LIPID PANEL
Chol/HDL Ratio: 4.1 ratio (ref 0.0–4.4)
Cholesterol, Total: 207 mg/dL — ABNORMAL HIGH (ref 100–199)
HDL: 51 mg/dL (ref >39)
LDL Chol Calc (NIH): 127 mg/dL — ABNORMAL HIGH (ref 0–99)
Triglycerides: 162 mg/dL — ABNORMAL HIGH (ref 0–149)
VLDL Cholesterol Cal: 29 mg/dL (ref 5–40)

## 2019-10-03 LAB — COMPREHENSIVE METABOLIC PANEL
ALT: 16 IU/L (ref 0–32)
AST: 21 IU/L (ref 0–40)
Albumin/Globulin Ratio: 2.1 (ref 1.2–2.2)
Albumin: 4.6 g/dL (ref 3.7–4.7)
Alkaline Phosphatase: 63 IU/L (ref 39–117)
BUN/Creatinine Ratio: 14 (ref 12–28)
BUN: 16 mg/dL (ref 8–27)
Bilirubin Total: 0.7 mg/dL (ref 0.0–1.2)
CO2: 28 mmol/L (ref 20–29)
Calcium: 10 mg/dL (ref 8.7–10.3)
Chloride: 101 mmol/L (ref 96–106)
Creatinine, Ser: 1.11 mg/dL — ABNORMAL HIGH (ref 0.57–1.00)
GFR calc Af Amer: 54 mL/min/{1.73_m2} — ABNORMAL LOW (ref >59)
GFR calc non Af Amer: 47 mL/min/{1.73_m2} — ABNORMAL LOW (ref >59)
Globulin, Total: 2.2 g/dL (ref 1.5–4.5)
Glucose: 113 mg/dL — ABNORMAL HIGH (ref 65–99)
Potassium: 4.6 mmol/L (ref 3.5–5.2)
Sodium: 142 mmol/L (ref 134–144)
Total Protein: 6.8 g/dL (ref 6.0–8.5)

## 2019-10-03 LAB — VITAMIN D 25 HYDROXY (VIT D DEFICIENCY, FRACTURES): Vit D, 25-Hydroxy: 56.5 ng/mL (ref 30.0–100.0)

## 2019-10-03 LAB — HEMOGLOBIN A1C
Est. average glucose Bld gHb Est-mCnc: 120 mg/dL
Hgb A1c MFr Bld: 5.8 % — ABNORMAL HIGH (ref 4.8–5.6)

## 2019-10-03 NOTE — Telephone Encounter (Signed)
-----   Message from Mar Daring, [place] sent at 10/03/2019  1:00 PM EST ----- Kidney function is improving compared to last time. Continue to push fluids. Liver enzymes are normal. Sodium, potassium and calcium are normal. Cholesterol has increased compared to previous labs 6 months ago. Make sure to be taking rosuvastatin as prescribed. If taking as prescribed may need to increase to daily if can tolerate. A1c has slightly increased compared to 6 months ago, but less than last year, at 5.8 currently. Continue working on healthy lifestyle modifications. Vit D is normal.

## 2019-10-03 NOTE — Telephone Encounter (Signed)
Patient advised as below. Patient refuses to increase dosage on rosuvastatin. Patient reports she will try diet and exercise.

## 2019-10-16 DIAGNOSIS — E119 Type 2 diabetes mellitus without complications: Secondary | ICD-10-CM | POA: Diagnosis not present

## 2019-10-16 LAB — HM DIABETES EYE EXAM

## 2019-10-23 ENCOUNTER — Encounter: Payer: Self-pay | Admitting: Family Medicine

## 2019-11-07 DIAGNOSIS — I2510 Atherosclerotic heart disease of native coronary artery without angina pectoris: Secondary | ICD-10-CM | POA: Diagnosis not present

## 2019-11-07 DIAGNOSIS — I10 Essential (primary) hypertension: Secondary | ICD-10-CM | POA: Diagnosis not present

## 2019-11-07 DIAGNOSIS — I213 ST elevation (STEMI) myocardial infarction of unspecified site: Secondary | ICD-10-CM | POA: Diagnosis not present

## 2019-11-07 DIAGNOSIS — I21A1 Myocardial infarction type 2: Secondary | ICD-10-CM | POA: Diagnosis not present

## 2019-11-07 DIAGNOSIS — Z951 Presence of aortocoronary bypass graft: Secondary | ICD-10-CM | POA: Diagnosis not present

## 2020-01-16 ENCOUNTER — Other Ambulatory Visit: Payer: Self-pay | Admitting: Family Medicine

## 2020-01-16 NOTE — Telephone Encounter (Signed)
Requested Prescriptions  Pending Prescriptions Disp Refills  . KLOR-CON M20 20 MEQ tablet [Pharmacy Med Name: KLOR-CON M20 TABLET] 90 tablet 0    Sig: TAKE 1 TABLET BY MOUTH EVERY DAY     Endocrinology:  Minerals - Potassium Supplementation Failed - 01/16/2020 11:41 AM      Failed - Cr in normal range and within 360 days    Creat  Date Value Ref Range Status  09/28/2017 1.12 (H) 0.60 - 0.93 mg/dL Final    Comment:    For patients >81 years of age, the reference limit for Creatinine is approximately 13% higher for people identified as African-American. .    Creatinine, Ser  Date Value Ref Range Status  10/02/2019 1.11 (H) 0.57 - 1.00 mg/dL Final         Passed - K in normal range and within 360 days    Potassium  Date Value Ref Range Status  10/02/2019 4.6 3.5 - 5.2 mmol/L Final  01/09/2014 4.3 3.5 - 5.1 mmol/L Final         Passed - Valid encounter within last 12 months    Recent Outpatient Visits          3 months ago Encounter for annual physical exam   [facility], Dionne Bucy, MD   7 months ago Facial swelling   [facility] [place], Dionne Bucy, MD   10 months ago Essential hypertension   [facility], Dionne Bucy, MD   1 year ago Need for zoster vaccination   [facility], Dionne Bucy, MD   1 year ago Need for zoster vaccination   [facility], Dionne Bucy, MD      Future Appointments            In 1 month Bacigalupo, Dionne Bucy, MD [facility]           . furosemide (LASIX) 40 MG tablet [Pharmacy Med Name: FUROSEMIDE 40 MG TABLET] 90 tablet 0    Sig: TAKE 0.5 TABLETS (20 MG TOTAL) BY MOUTH 2 (TWO) TIMES DAILY.     Cardiovascular:  Diuretics - Loop Failed - 01/16/2020 11:41 AM      Failed - Cr in normal range and within 360 days    Creat  Date Value Ref Range Status  09/28/2017 1.12 (H) 0.60 - 0.93 mg/dL Final    Comment:    For  patients >28 years of age, the reference limit for Creatinine is approximately 13% higher for people identified as African-American. .    Creatinine, Ser  Date Value Ref Range Status  10/02/2019 1.11 (H) 0.57 - 1.00 mg/dL Final         Failed - Valid encounter within last 6 months    Recent Outpatient Visits          3 months ago Encounter for annual physical exam   [facility] [place], Dionne Bucy, MD   7 months ago Facial swelling   [facility] [place], Dionne Bucy, MD   10 months ago Essential hypertension   [facility] [place], Dionne Bucy, MD   1 year ago Need for zoster vaccination   [facility], Dionne Bucy, MD   1 year ago Need for zoster vaccination   [facility] Bacigal[facility], Dionne Bucy, MD      Future Appointments            In 1 month Bacigalupo, Dionne Bucy, MD  [facility]           Passed - K in normal range and within 360 days    Potassium  Date Value Ref Range Status  10/02/2019 4.6 3.5 - 5.2 mmol/L Final  01/09/2014 4.3 3.5 - 5.1 mmol/L Final         Passed - Ca in normal range and within 360 days    Calcium  Date Value Ref Range Status  10/02/2019 10.0 8.7 - 10.3 mg/dL Final   Calcium, Total  Date Value Ref Range Status  05/13/2013 8.4 (L) 8.5 - 10.1 mg/dL Final         Passed - Na in normal range and within 360 days    Sodium  Date Value Ref Range Status  10/02/2019 142 134 - 144 mmol/L Final  05/13/2013 142 136 - 145 mmol/L Final         Passed - Last BP in normal range    BP Readings from Last 1 Encounters:  10/02/19 120/60

## 2020-02-28 ENCOUNTER — Ambulatory Visit (INDEPENDENT_AMBULATORY_CARE_PROVIDER_SITE_OTHER): Payer: Medicare Other | Admitting: Family Medicine

## 2020-02-28 ENCOUNTER — Encounter: Payer: Self-pay | Admitting: Family Medicine

## 2020-02-28 ENCOUNTER — Other Ambulatory Visit: Payer: Self-pay | Admitting: Family Medicine

## 2020-02-28 ENCOUNTER — Other Ambulatory Visit: Payer: Self-pay

## 2020-02-28 VITALS — BP 137/75 | HR 61 | Temp 96.9°F | Resp 18 | Wt 119.0 lb

## 2020-02-28 DIAGNOSIS — R7303 Prediabetes: Secondary | ICD-10-CM

## 2020-02-28 DIAGNOSIS — Z1231 Encounter for screening mammogram for malignant neoplasm of breast: Secondary | ICD-10-CM

## 2020-02-28 DIAGNOSIS — I10 Essential (primary) hypertension: Secondary | ICD-10-CM | POA: Diagnosis not present

## 2020-02-28 DIAGNOSIS — E7800 Pure hypercholesterolemia, unspecified: Secondary | ICD-10-CM | POA: Diagnosis not present

## 2020-02-28 DIAGNOSIS — M5432 Sciatica, left side: Secondary | ICD-10-CM

## 2020-02-28 NOTE — Progress Notes (Signed)
I,Roshena L Chambers,acting as a scribe for Lavon Paganini, MD.,have documented all relevant documentation on the behalf of Lavon Paganini, MD,as directed by  Lavon Paganini, MD while in the presence of Lavon Paganini, MD.    Established patient visit   Patient: Sheila Kim   DOB: Jul 02, 1941   79 y.o. Female  MRN: [id number] Visit Date: 02/28/2020  Today's healthcare provider: Lavon Paganini, MD   Chief Complaint  Patient presents with  . Hyperlipidemia  . Hypertension  . Hyperglycemia   Subjective    HPI Prediabetes, Follow-up  Lab Results  Component Value Date   HGBA1C 5.8 (H) 10/02/2019   HGBA1C 5.7 (H) 03/26/2019   HGBA1C 5.9 (H) 09/20/2018   GLUCOSE 113 (H) 10/02/2019   GLUCOSE 97 03/26/2019   GLUCOSE 96 09/20/2018    Last seen for for this 5 months ago.  Management since that visit includes counseling patient to continue work on healthy lifestyle modifications.. Current symptoms include none and have been stable.  Prior visit with dietician: no Current diet: in general, a "healthy" diet   vegetarian diet Current exercise: walking and tredmill and chair exercise  Pertinent Labs:    Component Value Date/Time   CHOL 207 (H) 10/02/2019 1039   TRIG 162 (H) 10/02/2019 1039   CHOLHDL 4.1 10/02/2019 1039   CHOLHDL 4.2 09/28/2017 0840   CREATININE 1.11 (H) 10/02/2019 1039   CREATININE 1.12 (H) 09/28/2017 0840    Wt Readings from Last 3 Encounters:  02/28/20 119 lb (54 kg)  10/02/19 121 lb (54.9 kg)  06/20/19 121 lb (54.9 kg)    -----------------------------------------------------------------------------------------  Lipid/Cholesterol, Follow-up  Last lipid panel Other pertinent labs  Lab Results  Component Value Date   CHOL 207 (H) 10/02/2019   HDL 51 10/02/2019   LDLCALC 127 (H) 10/02/2019   TRIG 162 (H) 10/02/2019   CHOLHDL 4.1 10/02/2019   Lab Results  Component Value Date   ALT 16 10/02/2019   AST 21 10/02/2019   PLT 319 09/20/2018   TSH 0.865 03/23/2016     She was last seen for this 5 months ago.  Management since that visit includes recommend increasing Rosuvastatin. Patient refused to increase dose and stated she would work on diet and exercise.  She reports good compliance with treatment. She is not having side effects.   Symptoms: No chest pain No chest pressure/discomfort  No dyspnea No lower extremity edema  No numbness or tingling of extremity No orthopnea  No palpitations No paroxysmal nocturnal dyspnea  No speech difficulty No syncope   Current diet: in general, a "healthy" diet   Current exercise: walking  The ASCVD Risk score ([place]., et al., 2013) failed to calculate for the following reasons:   The 2013 ASCVD risk score is only valid for ages 29 to 53   The patient has a prior MI or stroke diagnosis  ---------------------------------------------------------------------------------------------------  Hypertension, follow-up  BP Readings from Last 3 Encounters:  02/28/20 137/75  10/02/19 120/60  06/20/19 (!) 150/90   Wt Readings from Last 3 Encounters:  02/28/20 119 lb (54 kg)  10/02/19 121 lb (54.9 kg)  06/20/19 121 lb (54.9 kg)     She was last seen for hypertension 5 months ago.  BP at that visit was 120/60. Management since that visit includes continuing same medications.  She reports good compliance with treatment. She is not having side effects.  She is following a Regular diet. She is exercising. She does not smoke.  Use  of agents associated with hypertension: NSAIDS.   Outside blood pressures are 119/68. Symptoms: No chest pain No chest pressure  No palpitations No syncope  No dyspnea No orthopnea  No paroxysmal nocturnal dyspnea No lower extremity edema   Pertinent labs: Lab Results  Component Value Date   CHOL 207 (H) 10/02/2019   HDL 51 10/02/2019   LDLCALC 127 (H) 10/02/2019   TRIG 162 (H) 10/02/2019   CHOLHDL 4.1 10/02/2019   Lab  Results  Component Value Date   NA 142 10/02/2019   K 4.6 10/02/2019   CREATININE 1.11 (H) 10/02/2019   GFRNONAA 47 (L) 10/02/2019   GFRAA 54 (L) 10/02/2019   GLUCOSE 113 (H) 10/02/2019     The ASCVD Risk score Mikey Bussing DC Jr., et al., 2013) failed to calculate for the following reasons:   The 2013 ASCVD risk score is only valid for ages 86 to 1   The patient has a prior MI or stroke diagnosis   --------------------------------------------------------------------------------------------------- Left side Sciatic pain: Patient complains of pain everyday for the past 9 months. She has been taking  extra strength Tylenol to help with pain. Patient is requesting a referral to a specialist.      Medications: Outpatient Medications Prior to Visit  Medication Sig  . acetaminophen (TYLENOL) 500 MG tablet Take 1,000 mg by mouth 2 (two) times daily.   . Alpha-D-Galactosidase (BEANO TO GO) TABS Take by mouth.   Marland Kitchen aspirin 81 MG tablet Take 81 mg by mouth daily.   Marland Kitchen CALCIUM CITRATE PO Take 315 mg by mouth 2 (two) times daily.   . cholecalciferol (VITAMIN D) 1000 UNITS tablet Take 1,000 Units by mouth daily.   . Coenzyme Q10 (COQ10) 100 MG CAPS Take 200 mg by mouth daily.   . cyanocobalamin 1000 MCG tablet Take 1,000 mcg by mouth daily.   . diclofenac sodium (VOLTAREN) 1 % GEL Apply 2 g topically 4 (four) times daily. (Patient taking differently: Apply 2 g topically 4 (four) times daily. As needed)  . famotidine (PEPCID) 20 MG tablet Take 20 mg by mouth daily.  . ferrous sulfate 325 (65 FE) MG tablet Take 325 mg by mouth daily with breakfast.   . fluticasone (FLONASE) 50 MCG/ACT nasal spray Place 2 sprays into both nostrils daily.  . folic acid (FOLVITE) 1 MG tablet Take 1 mg by mouth daily.   . furosemide (LASIX) 40 MG tablet TAKE 0.5 TABLETS (20 MG TOTAL) BY MOUTH 2 (TWO) TIMES DAILY.  Marland Kitchen ketotifen (ZADITOR) 0.025 % ophthalmic solution Place 1 drop into both eyes as needed.  Marland Kitchen KLOR-CON M20 20  MEQ tablet TAKE 1 TABLET BY MOUTH EVERY DAY  . loratadine (CLARITIN) 10 MG tablet Take 10 mg by mouth daily.   Marland Kitchen MELATONIN EXTRA STRENGTH PO Take 0.25 tablets by mouth at bedtime as needed.   . Multiple Vitamins-Minerals ([employer] SPECTRAVITE ADULT 50+ PO) Education officer, community - Historical Medication  daily Active  . pantoprazole (PROTONIX) 40 MG tablet Take 1 tablet (40 mg total) by mouth 2 (two) times daily.  Vladimir Faster Glycol-Propyl Glycol (SYSTANE) 0.4-0.3 % SOLN Apply to eye.   . polyethylene glycol (MIRALAX / GLYCOLAX) packet Take 17 g by mouth daily.  . Probiotic Product ([employer]S SENIOR PROBIOTIC) CAPS Take by mouth daily.   . propranolol (INDERAL) 40 MG tablet Take 0.5 tablets (20 mg total) by mouth 2 (two) times daily.  . Psyllium Husk POWD Take by mouth daily.   Marland Kitchen pyridoxine (B-6) 100  MG tablet Take 100 mg by mouth daily.   . rosuvastatin (CRESTOR) 5 MG tablet TAKE 1 TABLET DAILY EXCEPT THURSDAY AND SUNDAY   No facility-administered medications prior to visit.    Review of Systems  Constitutional: Negative for appetite change, chills, fatigue and fever.  Respiratory: Negative for chest tightness and shortness of breath.   Cardiovascular: Negative for chest pain and palpitations.  Gastrointestinal: Negative for abdominal pain, nausea and vomiting.  Musculoskeletal: Positive for myalgias (in left thigh).  Skin:       Insect bites on right arm  Neurological: Negative for dizziness and weakness.      Objective    BP 137/75 (BP Location: Left Arm, Cuff Size: Large)   Pulse 61   Temp (!) 96.9 F (36.1 C) (Temporal)   Resp 18   Wt 119 lb (54 kg)   BMI 24.04 kg/m    Physical Exam Constitutional:      Appearance: Normal appearance.  Cardiovascular:     Rate and Rhythm: Normal rate and regular rhythm.     Heart sounds: Normal heart sounds.  Pulmonary:     Effort: Pulmonary effort is normal.     Breath sounds: Normal breath sounds.  Abdominal:      Palpations: Abdomen is soft.  Musculoskeletal:     Cervical back: Neck supple. No tenderness.  Lymphadenopathy:     Cervical: No cervical adenopathy.  Skin:    General: Skin is warm and dry.  Neurological:     Mental Status: She is alert and oriented to person, place, and time. Mental status is at baseline.     Motor: No weakness.  Psychiatric:        Mood and Affect: Mood normal.        Behavior: Behavior normal.     No results found for any visits on 02/28/20.  Assessment & Plan     1. Hypercholesteremia Continue Crestor 5 times weekly Recheck CMP and FLP Goal LDL <70 Also followed by Cardiology - Comprehensive Metabolic Panel (CMET) - HgB A1c - Lipid panel  2. Prediabetes Chronic and stable Not on medications Continue exercise and low carb diet Recheck A1c - Comprehensive Metabolic Panel (CMET) - HgB A1c - Lipid panel  3. Essential hypertension Well controlled Continue current medications Recheck metabolic panel F/u in 6 months  4. Left sciatic nerve pain Worsened and persistent for the last 9 months. Will refer to Orthopedics. - Ambulatory referral to Orthopedic Surgery  5. Encounter for screening mammogram for malignant neoplasm of breast - MM Digital Screening; Future  Return in about 6 months (around 08/30/2020) for CPE .      I, Lavon Paganini, MD, have reviewed all documentation for this visit. The documentation on 02/28/20 for the exam, diagnosis, procedures, and orders are all accurate and complete.   Alya Smaltz, Dionne Bucy, MD, MPH [facility]

## 2020-02-29 ENCOUNTER — Other Ambulatory Visit: Payer: Self-pay | Admitting: Family Medicine

## 2020-02-29 ENCOUNTER — Telehealth: Payer: Self-pay

## 2020-02-29 DIAGNOSIS — I10 Essential (primary) hypertension: Secondary | ICD-10-CM

## 2020-02-29 LAB — COMPREHENSIVE METABOLIC PANEL
ALT: 13 IU/L (ref 0–32)
AST: 23 IU/L (ref 0–40)
Albumin/Globulin Ratio: 1.8 (ref 1.2–2.2)
Albumin: 4.5 g/dL (ref 3.6–4.6)
Alkaline Phosphatase: 64 IU/L (ref 48–121)
BUN/Creatinine Ratio: 18 (ref 12–28)
BUN: 23 mg/dL (ref 8–27)
Bilirubin Total: 1 mg/dL (ref 0.0–1.2)
CO2: 26 mmol/L (ref 20–29)
Calcium: 10 mg/dL (ref 8.7–10.3)
Chloride: 97 mmol/L (ref 96–106)
Creatinine, Ser: 1.29 mg/dL — ABNORMAL HIGH (ref 0.57–1.00)
GFR calc Af Amer: 45 mL/min/{1.73_m2} — ABNORMAL LOW (ref >59)
GFR calc non Af Amer: 39 mL/min/{1.73_m2} — ABNORMAL LOW (ref >59)
Globulin, Total: 2.5 g/dL (ref 1.5–4.5)
Glucose: 103 mg/dL — ABNORMAL HIGH (ref 65–99)
Potassium: 4.2 mmol/L (ref 3.5–5.2)
Sodium: 139 mmol/L (ref 134–144)
Total Protein: 7 g/dL (ref 6.0–8.5)

## 2020-02-29 LAB — LIPID PANEL
Chol/HDL Ratio: 4.3 ratio (ref 0.0–4.4)
Cholesterol, Total: 204 mg/dL — ABNORMAL HIGH (ref 100–199)
HDL: 47 mg/dL (ref >39)
LDL Chol Calc (NIH): 128 mg/dL — ABNORMAL HIGH (ref 0–99)
Triglycerides: 163 mg/dL — ABNORMAL HIGH (ref 0–149)
VLDL Cholesterol Cal: 29 mg/dL (ref 5–40)

## 2020-02-29 LAB — HEMOGLOBIN A1C
Est. average glucose Bld gHb Est-mCnc: 126 mg/dL
Hgb A1c MFr Bld: 6 % — ABNORMAL HIGH (ref 4.8–5.6)

## 2020-02-29 NOTE — Telephone Encounter (Signed)
[employer]   Result Notes and Comments to Patient Comment seen by patient Sheila Kim on 02/29/2020 9:29 AM EDT

## 2020-02-29 NOTE — Telephone Encounter (Signed)
-----   Message from Virginia Crews, MD sent at 02/29/2020  8:37 AM EDT ----- Normal/stable labs

## 2020-03-06 ENCOUNTER — Telehealth: Payer: Self-pay

## 2020-03-06 ENCOUNTER — Ambulatory Visit
Admission: RE | Admit: 2020-03-06 | Discharge: 2020-03-06 | Disposition: A | Discharge status: Home / Self Care | Payer: Medicare Other | Source: Ambulatory Visit | Attending: Family Medicine | Admitting: Family Medicine

## 2020-03-06 DIAGNOSIS — Z1231 Encounter for screening mammogram for malignant neoplasm of breast: Secondary | ICD-10-CM | POA: Insufficient documentation

## 2020-03-06 NOTE — Telephone Encounter (Signed)
Comment seen by patient Sheila Kim on 03/06/2020 4:09 PM EDT

## 2020-03-06 NOTE — Telephone Encounter (Signed)
-----   Message from Virginia Crews, MD sent at 03/06/2020  2:07 PM EDT ----- Normal mammogram. Repeat in 1 yr

## 2020-03-25 ENCOUNTER — Other Ambulatory Visit: Payer: Self-pay | Admitting: Physical Medicine & Rehabilitation

## 2020-03-25 DIAGNOSIS — G8929 Other chronic pain: Secondary | ICD-10-CM

## 2020-03-25 DIAGNOSIS — M5442 Lumbago with sciatica, left side: Secondary | ICD-10-CM | POA: Diagnosis not present

## 2020-04-04 ENCOUNTER — Ambulatory Visit
Admission: RE | Admit: 2020-04-04 | Discharge: 2020-04-04 | Disposition: A | Discharge status: Home / Self Care | Payer: Medicare Other | Source: Ambulatory Visit | Attending: Physical Medicine & Rehabilitation | Admitting: Physical Medicine & Rehabilitation

## 2020-04-04 ENCOUNTER — Other Ambulatory Visit: Payer: Self-pay

## 2020-04-04 DIAGNOSIS — M5126 Other intervertebral disc displacement, lumbar region: Secondary | ICD-10-CM | POA: Diagnosis not present

## 2020-04-04 DIAGNOSIS — M5442 Lumbago with sciatica, left side: Secondary | ICD-10-CM

## 2020-04-04 DIAGNOSIS — G8929 Other chronic pain: Secondary | ICD-10-CM | POA: Insufficient documentation

## 2020-04-04 DIAGNOSIS — M48061 Spinal stenosis, lumbar region without neurogenic claudication: Secondary | ICD-10-CM | POA: Diagnosis not present

## 2020-04-04 MED ORDER — GADOBUTROL 1 MMOL/ML IV SOLN
5.0000 mL | Freq: Once | INTRAVENOUS | Status: AC | PRN
  Administered 2020-04-04: 5 mL via INTRAVENOUS

## 2020-04-05 ENCOUNTER — Other Ambulatory Visit: Payer: Self-pay | Admitting: Family Medicine

## 2020-04-05 NOTE — Telephone Encounter (Signed)
Requested Prescriptions  Pending Prescriptions Disp Refills   furosemide (LASIX) 40 MG tablet [Pharmacy Med Name: FUROSEMIDE 40 MG TABLET] 90 tablet 1    Sig: TAKE 0.5 TABLETS (20 MG TOTAL) BY MOUTH 2 (TWO) TIMES DAILY.     Cardiovascular:  Diuretics - Loop Failed - 04/05/2020  3:18 PM      Failed - Cr in normal range and within 360 days    Creat  Date Value Ref Range Status  09/28/2017 1.12 (H) 0.60 - 0.93 mg/dL Final    Comment:    For patients >42 years of age, the reference limit for Creatinine is approximately 13% higher for people identified as African-American. .    Creatinine, Ser  Date Value Ref Range Status  02/28/2020 1.29 (H) 0.57 - 1.00 mg/dL Final         Passed - K in normal range and within 360 days    Potassium  Date Value Ref Range Status  02/28/2020 4.2 3.5 - 5.2 mmol/L Final  01/09/2014 4.3 3.5 - 5.1 mmol/L Final         Passed - Ca in normal range and within 360 days    Calcium  Date Value Ref Range Status  02/28/2020 10.0 8.7 - 10.3 mg/dL Final   Calcium, Total  Date Value Ref Range Status  05/13/2013 8.4 (L) 8.5 - 10.1 mg/dL Final         Passed - Na in normal range and within 360 days    Sodium  Date Value Ref Range Status  02/28/2020 139 134 - 144 mmol/L Final  05/13/2013 142 136 - 145 mmol/L Final         Passed - Last BP in normal range    BP Readings from Last 1 Encounters:  02/28/20 137/75         Passed - Valid encounter within last 6 months    Recent Outpatient Visits          1 month ago [place], Dionne Bucy, MD   6 months ago Encounter for annual physical exam   [facility], Dionne Bucy, MD   9 months ago Facial swelling   [facility] [place], Dionne Bucy, MD   1 year ago Essential hypertension   [place], Dionne Bucy, MD   1 year ago Need for zoster vaccination   [place], Dionne Bucy, MD      Future Appointments            In 5 months Bacigalupo, Dionne Bucy, MD [facility]            KLOR-CON M20 20 MEQ tablet [Pharmacy Med Name: KLOR-CON M20 TABLET] 90 tablet 1    Sig: TAKE 1 TABLET BY [place] DAY     Endocrinology:  Minerals - Potassium Supplementation Failed - 04/05/2020  3:18 PM      Failed - Cr in normal range and within 360 days    Creat  Date Value Ref Range Status  09/28/2017 1.12 (H) 0.60 - 0.93 mg/dL Final    Comment:    For patients >81 years of age, the reference limit for Creatinine is approximately 13% higher for people identified as African-American. .    Creatinine, Ser  Date Value Ref Range Status  02/28/2020 1.29 (H) 0.57 - 1.00 mg/dL Final         Passed - K in normal range and within  360 days    Potassium  Date Value Ref Range Status  02/28/2020 4.2 3.5 - 5.2 mmol/L Final  01/09/2014 4.3 3.5 - 5.1 mmol/L Final         Passed - Valid encounter within last 12 months    Recent Outpatient Visits          1 month ago [place], Dionne Bucy, MD   6 months ago Encounter for annual physical exam   [facility], Dionne Bucy, MD   9 months ago Facial swelling   [facility] [place], Dionne Bucy, MD   1 year ago Essential hypertension   [place], Dionne Bucy, MD   1 year ago Need for zoster vaccination   [facility] [place], Dionne Bucy, MD      Future Appointments            In 5 months Bacigalupo, Dionne Bucy, MD [facility] [place]

## 2020-04-08 DIAGNOSIS — G8929 Other chronic pain: Secondary | ICD-10-CM | POA: Diagnosis not present

## 2020-04-08 DIAGNOSIS — M5442 Lumbago with sciatica, left side: Secondary | ICD-10-CM | POA: Diagnosis not present

## 2020-04-08 DIAGNOSIS — M5116 Intervertebral disc disorders with radiculopathy, lumbar region: Secondary | ICD-10-CM | POA: Diagnosis not present

## 2020-04-23 DIAGNOSIS — M5442 Lumbago with sciatica, left side: Secondary | ICD-10-CM | POA: Diagnosis not present

## 2020-04-23 DIAGNOSIS — R7303 Prediabetes: Secondary | ICD-10-CM | POA: Diagnosis not present

## 2020-05-06 DIAGNOSIS — I213 ST elevation (STEMI) myocardial infarction of unspecified site: Secondary | ICD-10-CM | POA: Diagnosis not present

## 2020-05-06 DIAGNOSIS — I2510 Atherosclerotic heart disease of native coronary artery without angina pectoris: Secondary | ICD-10-CM | POA: Diagnosis not present

## 2020-05-06 DIAGNOSIS — Z951 Presence of aortocoronary bypass graft: Secondary | ICD-10-CM | POA: Diagnosis not present

## 2020-05-06 DIAGNOSIS — I10 Essential (primary) hypertension: Secondary | ICD-10-CM | POA: Diagnosis not present

## 2020-05-06 DIAGNOSIS — I21A1 Myocardial infarction type 2: Secondary | ICD-10-CM | POA: Diagnosis not present

## 2020-05-07 DIAGNOSIS — M5442 Lumbago with sciatica, left side: Secondary | ICD-10-CM | POA: Diagnosis not present

## 2020-05-07 DIAGNOSIS — M5116 Intervertebral disc disorders with radiculopathy, lumbar region: Secondary | ICD-10-CM | POA: Diagnosis not present

## 2020-05-07 DIAGNOSIS — G8929 Other chronic pain: Secondary | ICD-10-CM | POA: Diagnosis not present

## 2020-05-29 ENCOUNTER — Other Ambulatory Visit: Payer: Self-pay | Admitting: Family Medicine

## 2020-05-29 DIAGNOSIS — K219 Gastro-esophageal reflux disease without esophagitis: Secondary | ICD-10-CM

## 2020-05-29 NOTE — Telephone Encounter (Signed)
Requested Prescriptions  Pending Prescriptions Disp Refills   pantoprazole (PROTONIX) 40 MG tablet [Pharmacy Med Name: PANTOPRAZOLE SOD DR 40 MG TAB] 180 tablet 3    Sig: TAKE 1 TABLET BY MOUTH TWICE A DAY     Gastroenterology: Proton Pump Inhibitors Passed - 05/29/2020  1:48 AM      Passed - Valid encounter within last 12 months    Recent Outpatient Visits          3 months ago [place], Dionne Bucy, MD   8 months ago Encounter for annual physical exam   [facility], Dionne Bucy, MD   11 months ago Facial swelling   [facility] [place], Dionne Bucy, MD   1 year ago Essential hypertension   [place], Dionne Bucy, MD   1 year ago Need for zoster vaccination   [facility], Dionne Bucy, MD      Future Appointments            In 4 months Bacigalupo, Dionne Bucy, MD [facility] [place]

## 2020-07-24 ENCOUNTER — Ambulatory Visit (INDEPENDENT_AMBULATORY_CARE_PROVIDER_SITE_OTHER): Payer: Medicare Other

## 2020-07-24 ENCOUNTER — Other Ambulatory Visit: Payer: Self-pay

## 2020-07-24 DIAGNOSIS — Z23 Encounter for immunization: Secondary | ICD-10-CM

## 2020-09-29 ENCOUNTER — Other Ambulatory Visit: Payer: Self-pay | Admitting: Family Medicine

## 2020-09-29 DIAGNOSIS — I10 Essential (primary) hypertension: Secondary | ICD-10-CM

## 2020-09-29 DIAGNOSIS — E7800 Pure hypercholesterolemia, unspecified: Secondary | ICD-10-CM

## 2020-10-01 ENCOUNTER — Encounter: Payer: Self-pay | Admitting: Family Medicine

## 2020-10-15 DIAGNOSIS — E119 Type 2 diabetes mellitus without complications: Secondary | ICD-10-CM | POA: Diagnosis not present

## 2020-10-15 LAB — HM DIABETES EYE EXAM

## 2020-10-15 NOTE — Progress Notes (Signed)
Subjective:   Sheila Kim is a 80 y.o. female who presents for Medicare Annual (Subsequent) preventive examination.  I connected with Corrina Dickerman today by telephone and verified that I am speaking with the correct person using two identifiers. Location patient: home Location provider: work Persons participating in the virtual visit: patient, provider.   I discussed the limitations, risks, security and privacy concerns of performing an evaluation and management service by telephone and the availability of in person appointments. I also discussed with the patient that there may be a patient responsible charge related to this service. The patient expressed understanding and verbally consented to this telephonic visit.    Interactive audio and video telecommunications were attempted between this provider and patient, however failed, due to patient having technical difficulties OR patient did not have access to video capability.  We continued and completed visit with audio only.  Review of Systems    N/A  Cardiac Risk Factors include: advanced age (>7men, >52 women);dyslipidemia     Objective:    Today's Vitals   10/16/20 1110  PainSc: 3    There is no height or weight on file to calculate BMI.  Advanced Directives 10/16/2020 09/26/2019 09/20/2018 09/16/2017 09/15/2016 10/03/2015 09/17/2015  Does Patient Have a Medical Advance Directive? Yes Yes Yes Yes Yes Yes Yes  Type of Paramedic of [place];Living will [place];Living will [place];Living will [place];Living will [place];Living will [place];Living will Living will;Healthcare Power of Attorney  Does patient want to make changes to medical advance directive? - - - - - No - Patient declined -  Copy of [place] in Chart? Yes - validated most recent copy scanned in chart (See  row information) Yes - validated most recent copy scanned in chart (See row information) Yes - validated most recent copy scanned in chart (See row information) No - copy requested - No - copy requested -    Current Medications (verified) Outpatient Encounter Medications as of 10/16/2020  Medication Sig  . acetaminophen (TYLENOL) 500 MG tablet Take 500 mg by mouth. In the PM and as needed  . acetaminophen (TYLENOL) 650 MG CR tablet Take 650 mg by mouth. 2 tablets in the AM and 1 tablet in the PM  . Alpha-D-Galactosidase (BEANO TO GO) TABS Take by mouth daily at 6 (six) AM. occasionally  . aspirin 81 MG tablet Take 81 mg by mouth daily.   Marland Kitchen CALCIUM CITRATE PO Take 315 mg by mouth 2 (two) times daily.   . cholecalciferol (VITAMIN D) 1000 UNITS tablet Take 1,000 Units by mouth daily.   . Coenzyme Q10 (COQ10) 100 MG CAPS Take 200 mg by mouth daily.   . cyanocobalamin 1000 MCG tablet Take 1,000 mcg by mouth daily.   . famotidine (PEPCID) 20 MG tablet Take 20 mg by mouth 2 (two) times daily.  . ferrous sulfate 325 (65 FE) MG tablet Take 325 mg by mouth daily with breakfast.   . fluticasone (FLONASE) 50 MCG/ACT nasal spray Place 2 sprays into both nostrils daily.  . folic acid (FOLVITE) 1 MG tablet Take 1 mg by mouth daily.   . furosemide (LASIX) 40 MG tablet TAKE 0.5 TABLETS (20 MG TOTAL) BY MOUTH 2 (TWO) TIMES DAILY.  Marland Kitchen ketotifen (ZADITOR) 0.025 % ophthalmic solution Place 1 drop into both eyes as needed.  Marland Kitchen KLOR-CON M20 20 MEQ tablet TAKE 1 TABLET BY MOUTH EVERY  DAY  . loratadine (CLARITIN) 10 MG tablet Take 10 mg by mouth daily.   Marland Kitchen MELATONIN EXTRA STRENGTH PO Take 0.25 tablets by mouth at bedtime as needed.   . Multiple Vitamins-Minerals ([employer] SPECTRAVITE ADULT 50+ PO) Geneticist, molecular - Historical Medication  daily Active  . pantoprazole (PROTONIX) 40 MG tablet TAKE 1 TABLET BY MOUTH TWICE A DAY  . Polyethyl Glycol-Propyl Glycol 0.4-0.3 % SOLN Apply to eye as needed.  .  polyethylene glycol (MIRALAX / GLYCOLAX) packet Take 17 g by mouth daily.  . Probiotic Product (CVS SENIOR PROBIOTIC) CAPS Take by mouth daily.   . propranolol (INDERAL) 40 MG tablet TAKE 0.5 TABLETS (20 MG TOTAL) BY MOUTH 2 (TWO) TIMES DAILY.  Marland Kitchen Psyllium Husk POWD Take by mouth daily.   Marland Kitchen pyridoxine (B-6) 100 MG tablet Take 100 mg by mouth daily.   . rosuvastatin (CRESTOR) 5 MG tablet TAKE 1 TABLET DAILY EXCEPT THURSDAY AND SUNDAY  . diclofenac sodium (VOLTAREN) 1 % GEL Apply 2 g topically 4 (four) times daily. (Patient not taking: Reported on 10/16/2020)   No facility-administered encounter medications on file as of 10/16/2020.    Allergies (verified) Alcohol, Gelatin capsule empty  [gelatin], Gemfibrozil, Meloxicam, Nsaids, Oxycodone, Simvastatin, Sulfa antibiotics, Clarithromycin, Lopressor  [metoprolol], Niacin, and Ofloxacin   History: Past Medical History:  Diagnosis Date  . Anemia    history  . Arthritis    hands, spine  . Chronic kidney disease    reports one kidney "doesn't work well"  . GERD (gastroesophageal reflux disease)   . Hypertension   . Motion sickness    boats  . Seasonal allergies    Past Surgical History:  Procedure Laterality Date  . ABDOMINAL HYSTERECTOMY    . BACK SURGERY    . BREAST CYST ASPIRATION Left   . COLONOSCOPY WITH PROPOFOL N/A 10/03/2015   Procedure: COLONOSCOPY WITH PROPOFOL;  Surgeon: Midge Minium, MD;  Location: [facility];  Service: Endoscopy;  Laterality: N/A;  . 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 DILATION AND CURETTAGE OF UTERUS    . EYE SURGERY Bilateral 2015   cataract  . NOSE SURGERY  1962   deviated septum, hemorrhaged- pt had to return to hospital  . TONSILLECTOMY  1944  . ureteropelvic junction obstruction     Family History  Problem Relation Age of Onset  . Heart attack Mother        3 MI's  . Diabetes Mother   . Heart disease Mother   . Diabetes Father   . Heart attack Father   . COPD  Father   . Stroke Brother   . Diabetes Sister   . Heart disease Sister        2 stents  . Breast cancer Neg Hx    Social History   Socioeconomic History  . Marital status: Widowed    Spouse name: Daphine Deutscher  . Number of children: 2  . Years of education: 100  . Highest education level: Some college, no degree  Occupational History  . Occupation: retired  Tobacco Use  . Smoking status: Never Smoker  . Smokeless tobacco: Never Used  Vaping Use  . Vaping Use: Never used  Substance and Sexual Activity  . Alcohol use: No  . Drug use: No  . Sexual activity: Not on file  Other Topics Concern  . Not on file  Social History Narrative  . Not on file   Social Determinants of Health  Financial Resource Strain: Low Risk   . Difficulty of Paying Living Expenses: Not hard at all  Food Insecurity: No Food Insecurity  . Worried About Charity fundraiser in the Last Year: Never true  . Ran Out of Food in the Last Year: Never true  Transportation Needs: No Transportation Needs  . Lack of Transportation (Medical): No  . Lack of Transportation (Non-Medical): No  Physical Activity: Sufficiently Active  . Days of Exercise per Week: 6 days  . Minutes of Exercise per Session: 30 min  Stress: No Stress Concern Present  . Feeling of Stress : Not at all  Social Connections: Moderately Isolated  . Frequency of Communication with Friends and Family: More than three times a week  . Frequency of Social Gatherings with Friends and Family: More than three times a week  . Attends Religious Services: Never  . Active Member of Clubs or Organizations: Yes  . Attends Archivist Meetings: More than 4 times per year  . Marital Status: Widowed    Tobacco Counseling Counseling given: Not Answered   Clinical Intake:  Pre-visit preparation completed: Yes  Pain : 0-10 Pain Score: 3  Pain Type: Acute pain (x 1 month) Pain Location: Head (Headache) Pain Orientation: Left Pain Descriptors /  Indicators: Nagging,Headache,Dull Pain Frequency: Constant Pain Relieving Factors: Taking ES Tylenol as needed.  Pain Relieving Factors: Taking ES Tylenol as needed.  Nutritional Risks: None Diabetes: No  How often do you need to have someone help you when you read instructions, pamphlets, or other written materials from your doctor or pharmacy?: 1 - Never  Diabetic? Prediabetic  Interpreter Needed?: No  Information entered by :: [facility], LPN   Activities of Daily Living In your present state of health, do you have any difficulty performing the following activities: 10/16/2020  Hearing? N  Vision? N  Difficulty concentrating or making decisions? N  Walking or climbing stairs? N  Dressing or bathing? N  Doing errands, shopping? N  Preparing Food and eating ? N  Using the Toilet? N  In the past six months, have you accidently leaked urine? Y  Comment Occasionally  Do you have problems with loss of bowel control? N  Managing your Medications? N  Managing your Finances? N  Housekeeping or managing your Housekeeping? N  Some recent data might be hidden    Patient Care Team: Virginia Crews, MD as PCP - General (Family Medicine) Dingeldein, Remo Lipps, MD as Consulting Physician (Ophthalmology) Isaias Cowman, MD as Consulting Physician (Cardiology) Abbie Sons, MD as Consulting Physician (Urology) Doyle Askew, MD as Referring Physician (Physical Medicine and Rehabilitation)  Indicate any recent Medical Services you may have received from other than [facility] providers in the past year (date may be approximate).     Assessment:   This is a routine wellness examination for [place].  Hearing/Vision screen No exam data present  Dietary issues and exercise activities discussed: Current Exercise Habits: Home exercise routine, Type of exercise: walking;strength training/weights;stretching, Time (Minutes): 30, Frequency (Times/Week): 6, Weekly Exercise  (Minutes/Week): 180, Intensity: Mild, Exercise limited by: None identified  Goals    . DIET - REDUCE SUGAR INTAKE     Recommend cutting back and monitoring of dessert consumed in daily diet.      Depression Screen PHQ 2/9 Scores 10/16/2020 09/26/2019 03/22/2019 09/20/2018 09/20/2018 09/16/2017 09/15/2016  PHQ - 2 Score 0 1 0 0 0 0 0  PHQ- 9 Score - - - 2 - - -  Fall Risk Fall Risk  10/16/2020 09/26/2019 03/22/2019 09/20/2018 09/16/2017  Falls in the past year? 0 0 0 0 No  Number falls in past yr: 0 0 - - -  Injury with Fall? 0 0 - - -    FALL RISK PREVENTION PERTAINING TO THE HOME:  Any stairs in or around the home? No  If so, are there any without handrails? No  Home free of loose throw rugs in walkways, pet beds, electrical cords, etc? Yes  Adequate lighting in your home to reduce risk of falls? Yes   ASSISTIVE DEVICES UTILIZED TO PREVENT FALLS:  Life alert? No  Use of a cane, walker or w/c? No  Grab bars in the bathroom? Yes  Shower chair or bench in shower? No  Elevated toilet seat or a handicapped toilet? Yes    Cognitive Function: Normal cognitive status assessed by observation by this Nurse Health Advisor. No abnormalities found.       6CIT Screen 09/20/2018  What Year? 0 points  What month? 0 points  What time? 0 points  Count back from 20 0 points  Months in reverse 0 points  Repeat phrase 0 points  Total Score 0    Immunizations Immunization History  Administered Date(s) Administered  . Fluad Quad(high Dose 65+) 07/24/2019, 07/24/2020  . Influenza, High Dose Seasonal PF 07/26/2015, 08/06/2016, 07/21/2017, 08/03/2018  . PFIZER SARS-COV-2 Vaccination 11/14/2019, 12/05/2019, 07/08/2020  . Pneumococcal Conjugate-13 07/16/2014  . Pneumococcal Polysaccharide-23 03/16/1999, 08/21/2005  . Tdap 07/01/2011  . Zoster 10/01/2011  . Zoster Recombinat (Shingrix) 09/25/2018, 11/27/2018    TDAP status: Up to date  Flu Vaccine status: Up to date  Pneumococcal  vaccine status: Up to date  Covid-19 vaccine status: Completed vaccines  Qualifies for Shingles Vaccine? Yes   Zostavax completed Yes   Shingrix Completed?: Yes  Screening Tests Health Maintenance  Topic Date Due  . DEXA SCAN  09/20/2028 (Originally 04/10/2011)  . TETANUS/TDAP  06/30/2021  . INFLUENZA VACCINE  Completed  . COVID-19 Vaccine  Completed  . PNA vac Low Risk Adult  Completed    Health Maintenance  There are no preventive care reminders to display for this patient.  Colorectal cancer screening: No longer required.   Mammogram status: No longer required due to age.  Bone Density status: Currently due. Declined receiving an order at this time.   Lung Cancer Screening: (Low Dose CT Chest recommended if Age 73-80 years, 30 pack-year currently smoking OR have quit w/in 15years.) does not qualify.   Additional Screening:  Vision Screening: Recommended annual ophthalmology exams for early detection of glaucoma and other disorders of the eye. Is the patient up to date with their annual eye exam?  Yes  Who is the provider or what is the name of the office in which the patient attends annual eye exams? Dr Dingeldein @ [facility] If pt is not established with a provider, would they like to be referred to a provider to establish care? No .   Dental Screening: Recommended annual dental exams for proper oral hygiene  Community Resource Referral / Chronic Care Management: CRR required this visit?  No   CCM required this visit?  No      Plan:     I have personally reviewed and noted the following in the patient's chart:   . Medical and social history . Use of alcohol, tobacco or illicit drugs  . Current medications and supplements . Functional ability and status . Nutritional status . Physical activity .  Advanced directives . List of other physicians . Hospitalizations, surgeries, and ER visits in previous 12 months . Vitals . Screenings to include cognitive,  depression, and falls . Referrals and appointments  In addition, I have reviewed and discussed with patient certain preventive protocols, quality metrics, and best practice recommendations. A written personalized care plan for preventive services as well as general preventive health recommendations were provided to patient.     Orson [place], [place]   [id number]   Nurse Notes: Pt declined receiving anymore DEXA scans.

## 2020-10-16 ENCOUNTER — Ambulatory Visit (INDEPENDENT_AMBULATORY_CARE_PROVIDER_SITE_OTHER): Payer: Medicare Other | Admitting: Family Medicine

## 2020-10-16 ENCOUNTER — Other Ambulatory Visit: Payer: Self-pay

## 2020-10-16 ENCOUNTER — Ambulatory Visit (INDEPENDENT_AMBULATORY_CARE_PROVIDER_SITE_OTHER): Payer: Medicare Other

## 2020-10-16 VITALS — BP 186/93 | HR 66 | Temp 97.8°F | Ht <= 58 in | Wt 120.2 lb

## 2020-10-16 DIAGNOSIS — I10 Essential (primary) hypertension: Secondary | ICD-10-CM

## 2020-10-16 DIAGNOSIS — E7800 Pure hypercholesterolemia, unspecified: Secondary | ICD-10-CM | POA: Diagnosis not present

## 2020-10-16 DIAGNOSIS — Z0000 Encounter for general adult medical examination without abnormal findings: Secondary | ICD-10-CM

## 2020-10-16 DIAGNOSIS — G8929 Other chronic pain: Secondary | ICD-10-CM | POA: Insufficient documentation

## 2020-10-16 DIAGNOSIS — R7303 Prediabetes: Secondary | ICD-10-CM | POA: Diagnosis not present

## 2020-10-16 DIAGNOSIS — R519 Headache, unspecified: Secondary | ICD-10-CM

## 2020-10-16 NOTE — Progress Notes (Signed)
Complete physical exam   Patient: Sheila Kim   DOB: 12-Jul-1941   80 y.o. Female  MRN: [id number] Visit Date: 10/16/2020  Today's healthcare provider: Lavon Paganini, MD   Chief Complaint  Patient presents with   Annual Exam    Pt would like to talk about eye exam that her eye Dr. advised.  Pt also reports having a headache x 1 month   Subjective    Sheila Kim is a 80 y.o. female who presents today for a complete physical exam.  She reports consuming a general diet. She walks daily She generally feels well. She reports sleeping well. She does have additional problems to discuss today.  HPI HPI    Annual Exam     Additional comments: Pt would like to talk about eye exam that her eye Dr. advised.  Pt also reports having a headache x 1 month       Last edited by Sylvester Harder, Elbert on 10/16/2020  3:05 PM. (History)      Sheila Kim is concerned about a recent headache. She first noticed the headache 2 months ago and describes it as a dull 3/10 throbbing headache on her left side localizing to her temple. She has never had a headache like this before. She says that the headache has not gotten worse and is improved slightly with tylenol. It is not exacerbated by anything. She has had no associated vision changes or pain with chewing. She has noticed no new muscle aches or pains. She has had no recent trauma. She does not report any weakness, numbness or tingling. She has not had any recent illness.   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   DILATION AND CURETTAGE OF UTERUS     EYE SURGERY Bilateral 2015   cataract   NOSE SURGERY  1962   deviated septum, hemorrhaged- pt had to return to hospital   TONSILLECTOMY  1944   ureteropelvic junction obstruction     Social History   Socioeconomic History   Marital status: Widowed    Spouse name: Hassell Done   Number of children: 2   Years of education: 12   Highest education level: Some college, no degree  Occupational History   Occupation: retired  Tobacco Use   Smoking status: Never Smoker   Smokeless tobacco: Never Used  Scientific laboratory technician Use: Never used  Substance and Sexual Activity   Alcohol use: No   Drug use: No   Sexual activity: Not on file  Other Topics Concern   Not on file  Social History Narrative   Not on file   Social Determinants of Health   Financial Resource Strain: Low Risk    Difficulty of Paying Living Expenses: Not hard at all  Food Insecurity:  No Food Insecurity   Worried About Charity fundraiser in the Last Year: Never true   Ran Out of Food in the Last Year: Never true  Transportation Needs: No Transportation Needs   Lack of Transportation (Medical): No   Lack of Transportation (Non-Medical): No  Physical Activity: Sufficiently Active   Days of Exercise per Week: 6 days   Minutes of Exercise per Session: 30 min  Stress: No Stress Concern Present   Feeling of Stress : Not at all  Social Connections: Moderately Isolated   Frequency of Communication with Friends and Family: More than three times a week   Frequency of Social Gatherings with Friends and Family: More than three times a week   Attends Religious Services: Never   Marine scientist or Organizations: Yes   Attends Music therapist: More  than 4 times per year   Marital Status: Widowed  Human resources officer Violence: Not At Risk   Fear of Current or Ex-Partner: No   Emotionally Abused: No   Physically Abused: No   Sexually Abused: No   Family Status  Relation Name Status   Mother  Deceased at age 52   Father  Deceased at age 62   Brother  Deceased at age 43   Sister Margaretha Sheffield Alive   Sister Avis Deceased at age 69       multiple major organ failure   Neg Hx  (Not Specified)   Family History  Problem Relation Age of Onset   Heart attack Mother        3 MI's   Diabetes Mother    Heart disease Mother    Diabetes Father    Heart attack Father    COPD Father    Stroke Brother    Diabetes Sister    Heart disease Sister        2 stents   Breast cancer Neg Hx    Allergies  Allergen Reactions   Alcohol     Gastric pain   Gelatin Capsule Empty  [Gelatin]     Stomach pains   Gemfibrozil     Stomach pains, urine retention and muscle weakness.   Meloxicam     Drastically alters taste   Nsaids Other (See Comments)    Tolerates aspirin stomach pains and reflux   Oxycodone Itching    Trembling   Simvastatin Other (See Comments)    Feet and leg pain and general body weakness.   Sulfa Antibiotics Nausea And Vomiting and Other (See Comments)   Clarithromycin Rash and Nausea And Vomiting    Terrible taste in mouth and stomach ache.   Lopressor  [Metoprolol] Palpitations   Niacin Other (See Comments) and Rash    Flushing   Ofloxacin Rash and Nausea Only    Patient Care Team: Virginia Crews, MD as PCP - General (Family Medicine) Dingeldein, Remo Lipps, MD as Consulting Physician (Ophthalmology) Isaias Cowman, MD as Consulting Physician (Cardiology) Abbie Sons, MD as Consulting Physician (Urology) Doyle Askew, MD as Referring Physician (Physical Medicine and Rehabilitation)   Medications: Outpatient Medications Prior to Visit  Medication Sig   acetaminophen  (TYLENOL) 500 MG tablet Take 500 mg by mouth. In the PM and as needed   acetaminophen (TYLENOL) 650 MG CR tablet Take 650 mg by mouth. 2 tablets in the AM and 1 tablet in the PM   Alpha-D-Galactosidase (BEANO TO GO) TABS Take by mouth daily at 6 (six) AM. occasionally   aspirin 81  MG tablet Take 81 mg by mouth daily.    CALCIUM CITRATE PO Take 315 mg by mouth 2 (two) times daily.    cholecalciferol (VITAMIN D) 1000 UNITS tablet Take 1,000 Units by mouth daily.    Coenzyme Q10 (COQ10) 100 MG CAPS Take 200 mg by mouth daily.    cyanocobalamin 1000 MCG tablet Take 1,000 mcg by mouth daily.    diclofenac sodium (VOLTAREN) 1 % GEL Apply 2 g topically 4 (four) times daily.   famotidine (PEPCID) 20 MG tablet Take 20 mg by mouth 2 (two) times daily.   ferrous sulfate 325 (65 FE) MG tablet Take 325 mg by mouth daily with breakfast.    fluticasone (FLONASE) 50 MCG/ACT nasal spray Place 2 sprays into both nostrils daily.   folic acid (FOLVITE) 1 MG tablet Take 1 mg by mouth daily.    furosemide (LASIX) 40 MG tablet TAKE 0.5 TABLETS (20 MG TOTAL) BY MOUTH 2 (TWO) TIMES DAILY.   ketotifen (ZADITOR) 0.025 % ophthalmic solution Place 1 drop into both eyes as needed.   KLOR-CON M20 20 MEQ tablet TAKE 1 TABLET BY MOUTH EVERY DAY   loratadine (CLARITIN) 10 MG tablet Take 10 mg by mouth daily.    MELATONIN EXTRA STRENGTH PO Take 0.25 tablets by mouth at bedtime as needed.    Multiple Vitamins-Minerals ([employer] SPECTRAVITE ADULT 50+ PO) Education officer, community - Historical Medication  daily Active   pantoprazole (PROTONIX) 40 MG tablet TAKE 1 TABLET BY MOUTH TWICE A DAY   Polyethyl Glycol-Propyl Glycol 0.4-0.3 % SOLN Apply to eye as needed.   polyethylene glycol (MIRALAX / GLYCOLAX) packet Take 17 g by mouth daily.   Probiotic Product ([employer]S SENIOR PROBIOTIC) CAPS Take by mouth daily.    propranolol (INDERAL) 40 MG tablet TAKE 0.5 TABLETS (20 MG TOTAL) BY MOUTH 2 (TWO) TIMES  DAILY.   Psyllium Husk POWD Take by mouth daily.    pyridoxine (B-6) 100 MG tablet Take 100 mg by mouth daily.    rosuvastatin (CRESTOR) 5 MG tablet TAKE 1 TABLET DAILY EXCEPT THURSDAY AND SUNDAY   No facility-administered medications prior to visit.    Review of Systems  Constitutional: Negative.   HENT: Positive for congestion, postnasal drip and rhinorrhea.   Eyes: Negative.   Respiratory: Negative.   Cardiovascular: Positive for leg swelling.  Gastrointestinal: Negative.   Endocrine: Negative.   Genitourinary: Positive for enuresis.  Musculoskeletal: Positive for back pain.  Skin: Negative.   Allergic/Immunologic: Positive for environmental allergies.  Neurological: Positive for headaches.  Hematological: Negative.   Psychiatric/Behavioral: Negative.       Objective    BP (!) 186/93 (BP Location: Left Arm, Patient Position: Sitting, Cuff Size: Normal)    Pulse 66    Temp 97.8 F (36.6 C) (Oral)    Ht 4' 8.5" (1.435 m)    Wt 120 lb 3.2 oz (54.5 kg)    BMI 26.47 kg/m    Physical Exam   General: Well appearing, in NAD, speaking in full sentences HEENT: PERRL, tympanic membranes obstructed by cerumen. No cervical lymphadenopathy. Mild tenderness to palpation of L temple and temporal area. No frontal or maxillary sinus tenderness.  Lungs: CTA bilaterally, normal work of breath Abdomen: Soft Non-Tender Cards: RRR no murmurs rubs or gallops, no leg swelling Neuro: CN II-XII tested and intact, facial symmetry, strength 5/5 in all 4 extremities. Equal sensation to light touch and pressure throughout.  MSK: No bony point tenderness.   Last depression screening  scores PHQ 2/9 Scores 10/16/2020 10/16/2020 09/26/2019  PHQ - 2 Score 0 0 1  PHQ- 9 Score 1 - -   Last fall risk screening Fall Risk  10/16/2020  Falls in the past year? 0  Number falls in past yr: 0  Injury with Fall? 0   Last Audit-C alcohol use screening Alcohol Use Disorder Test (AUDIT) 10/16/2020  1. How often  do you have a drink containing alcohol? 0  2. How many drinks containing alcohol do you have on a typical day when you are drinking? 0  3. How often do you have six or more drinks on one occasion? 0  AUDIT-C Score 0  Alcohol Brief Interventions/Follow-up AUDIT Score <7 follow-up not indicated   A score of 3 or more in women, and 4 or more in men indicates increased risk for alcohol abuse, EXCEPT if all of the points are from question 1   Results for orders placed or performed in visit on 10/16/20  Sed Rate (ESR)  Result Value Ref Range   Sed Rate 6 0 - 40 mm/hr  C-reactive protein  Result Value Ref Range   CRP 2 0 - 10 mg/L  Comprehensive Metabolic Panel (CMET)  Result Value Ref Range   Glucose 104 (H) 65 - 99 mg/dL   BUN 15 8 - 27 mg/dL   Creatinine, Ser 1.23 (H) 0.57 - 1.00 mg/dL   GFR calc non Af Amer 41 (L) >59 mL/min/1.73   GFR calc Af Amer 48 (L) >59 mL/min/1.73   BUN/Creatinine Ratio 12 12 - 28   Sodium 138 134 - 144 mmol/L   Potassium 4.6 3.5 - 5.2 mmol/L   Chloride 99 96 - 106 mmol/L   CO2 25 20 - 29 mmol/L   Calcium 9.8 8.7 - 10.3 mg/dL   Total Protein 6.7 6.0 - 8.5 g/dL   Albumin 4.3 3.6 - 4.6 g/dL   Globulin, Total 2.4 1.5 - 4.5 g/dL   Albumin/Globulin Ratio 1.8 1.2 - 2.2   Bilirubin Total 0.9 0.0 - 1.2 mg/dL   Alkaline Phosphatase 63 44 - 121 IU/L   AST 23 0 - 40 IU/L   ALT 16 0 - 32 IU/L  Lipid Panel With LDL/HDL Ratio  Result Value Ref Range   Cholesterol, Total 218 (H) 100 - 199 mg/dL   Triglycerides 203 (H) 0 - 149 mg/dL   HDL 48 >39 mg/dL   VLDL Cholesterol Cal 36 5 - 40 mg/dL   LDL Chol Calc (NIH) 134 (H) 0 - 99 mg/dL   LDL/HDL Ratio 2.8 0.0 - 3.2 ratio  HgB A1c  Result Value Ref Range   Hgb A1c MFr Bld 5.9 (H) 4.8 - 5.6 %   Est. average glucose Bld gHb Est-mCnc 123 mg/dL  CBC with Differential  Result Value Ref Range   WBC 8.8 3.4 - 10.8 x10E3/uL   RBC 4.36 3.77 - 5.28 x10E6/uL   Hemoglobin 14.6 11.1 - 15.9 g/dL   Hematocrit 42.1 34.0 - 46.6 %    MCV 97 79 - 97 fL   MCH 33.5 (H) 26.6 - 33.0 pg   MCHC 34.7 31.5 - 35.7 g/dL   RDW 12.1 11.7 - 15.4 %   Platelets 318 150 - 450 x10E3/uL   Neutrophils 59 Not Estab. %   Lymphs 27 Not Estab. %   Monocytes 9 Not Estab. %   Eos 4 Not Estab. %   Basos 1 Not Estab. %   Neutrophils Absolute 5.2 1.4 - 7.0  x10E3/uL   Lymphocytes Absolute 2.4 0.7 - 3.1 x10E3/uL   Monocytes Absolute 0.8 0.1 - 0.9 x10E3/uL   EOS (ABSOLUTE) 0.3 0.0 - 0.4 x10E3/uL   Basophils Absolute 0.1 0.0 - 0.2 x10E3/uL   Immature Granulocytes 0 Not Estab. %   Immature Grans (Abs) 0.0 0.0 - 0.1 x10E3/uL    Assessment & Plan    Routine Health Maintenance and Physical Exam  Exercise Activities and Dietary recommendations Goals     DIET - REDUCE SUGAR INTAKE     Recommend cutting back and monitoring of dessert consumed in daily diet.       Immunization History  Administered Date(s) Administered   Fluad Quad(high Dose 65+) 07/24/2019, 07/24/2020   Influenza, High Dose Seasonal PF 07/26/2015, 08/06/2016, 07/21/2017, 08/03/2018   PFIZER SARS-COV-2 Vaccination 11/14/2019, 12/05/2019, 07/08/2020   Pneumococcal Conjugate-13 07/16/2014   Pneumococcal Polysaccharide-23 03/16/1999, 08/21/2005   Tdap 07/01/2011   Zoster 10/01/2011   Zoster Recombinat (Shingrix) 09/25/2018, 11/27/2018    Health Maintenance  Topic Date Due   DEXA SCAN  09/20/2028 (Originally 04/10/2011)   TETANUS/TDAP  06/30/2021   INFLUENZA VACCINE  Completed   COVID-19 Vaccine  Completed   PNA vac Low Risk Adult  Completed    Discussed health benefits of physical activity, and encouraged her to engage in regular exercise appropriate for her age and condition.  Problem List Items Addressed This Visit      Cardiovascular and Mediastinum   BP (high blood pressure)    Previously well controlled, reports blood pressures 578-469 systolic on home reads Initially elevated today No Changes to medication regimen CMP F/u 6 months       Relevant Orders   Comprehensive Metabolic Panel (CMET) (Completed)     Other   Hypercholesteremia    Continue Crestor 5 mg 5x weekly Followed by Cardiology Goal LDL < 70 Recheck FLP today      Relevant Orders   Comprehensive Metabolic Panel (CMET) (Completed)   Lipid Panel With LDL/HDL Ratio (Completed)   Prediabetes    Chronic Not on medications Continue diet and exercise Recheck A1c today      Relevant Orders   HgB A1c (Completed)   Chronic nonintractable headache    Unilateral dull throbbing headache for 3 months Not associated with vision changes or jaw claudication Point tenderness over temple C/f Temporal Arteritis vs. Non-specific headache Plan: ESR, SED Rate CBC w/ diff Counseled on return precautions If persists, biopsy temporal artery and treat with high dose Prednisone       Relevant Orders   Sed Rate (ESR) (Completed)   C-reactive protein (Completed)   CBC with Differential (Completed)   Encounter for annual physical exam - Primary   Relevant Orders   Comprehensive Metabolic Panel (CMET) (Completed)   Lipid Panel With LDL/HDL Ratio (Completed)   HgB A1c (Completed)   CBC with Differential (Completed)       Return in about 6 months (around 04/15/2021).     Patient seen along with MS3 student [facility]. I personally evaluated this patient along with the student, and verified all aspects of the history, physical exam, and medical decision making as documented by the student. I agree with the student's documentation and have made all necessary edits.  Norissa Bartee, Dionne Bucy, MD, MPH [facility]

## 2020-10-16 NOTE — Assessment & Plan Note (Signed)
Continue Crestor 5 mg 5x weekly Followed by Cardiology Goal LDL < 70 Recheck FLP today

## 2020-10-16 NOTE — Assessment & Plan Note (Signed)
Previously well controlled, reports blood pressures 117-130 systolic on home reads Initially elevated today No Changes to medication regimen CMP F/u 6 months

## 2020-10-16 NOTE — Patient Instructions (Signed)
Preventive Care 82 Years and Older, Female Preventive care refers to lifestyle choices and visits with your health care provider that can promote health and wellness. This includes:  A yearly physical exam. This is also called an annual well check.  Regular dental and eye exams.  Immunizations.  Screening for certain conditions.  Healthy lifestyle choices, such as diet and exercise. What can I expect for my preventive care visit? Physical exam Your health care provider will check:  Height and weight. These may be used to calculate body mass index (BMI), which is a measurement that tells if you are at a healthy weight.  Heart rate and blood pressure.  Your skin for abnormal spots. Counseling Your health care provider may ask you questions about:  Alcohol, tobacco, and drug use.  Emotional well-being.  Home and relationship well-being.  Sexual activity.  Eating habits.  History of falls.  Memory and ability to understand (cognition).  Work and work Statistician.  Pregnancy and menstrual history. What immunizations do I need?  Influenza (flu) vaccine  This is recommended every year. Tetanus, diphtheria, and pertussis (Tdap) vaccine  You may need a Td booster every 10 years. Varicella (chickenpox) vaccine  You may need this vaccine if you have not already been vaccinated. Zoster (shingles) vaccine  You may need this after age 65. Pneumococcal conjugate (PCV13) vaccine  One dose is recommended after age 16. Pneumococcal polysaccharide (PPSV23) vaccine  One dose is recommended after age 91. Measles, mumps, and rubella (MMR) vaccine  You may need at least one dose of MMR if you were born in 1957 or later. You may also need a second dose. Meningococcal conjugate (MenACWY) vaccine  You may need this if you have certain conditions. Hepatitis A vaccine  You may need this if you have certain conditions or if you travel or work in places where you may be exposed  to hepatitis A. Hepatitis B vaccine  You may need this if you have certain conditions or if you travel or work in places where you may be exposed to hepatitis B. Haemophilus influenzae type b (Hib) vaccine  You may need this if you have certain conditions. You may receive vaccines as individual doses or as more than one vaccine together in one shot (combination vaccines). Talk with your health care provider about the risks and benefits of combination vaccines. What tests do I need? Blood tests  Lipid and cholesterol levels. These may be checked every 5 years, or more frequently depending on your overall health.  Hepatitis C test.  Hepatitis B test. Screening  Lung cancer screening. You may have this screening every year starting at age 15 if you have a 30-pack-year history of smoking and currently smoke or have quit within the past 15 years.  Colorectal cancer screening. All adults should have this screening starting at age 107 and continuing until age 60. Your health care provider may recommend screening at age 41 if you are at increased risk. You will have tests every 1-10 years, depending on your results and the type of screening test.  Diabetes screening. This is done by checking your blood sugar (glucose) after you have not eaten for a while (fasting). You may have this done every 1-3 years.  Mammogram. This may be done every 1-2 years. Talk with your health care provider about how often you should have regular mammograms.  BRCA-related cancer screening. This may be done if you have a family history of breast, ovarian, tubal, or peritoneal cancers.  Other tests  Sexually transmitted disease (STD) testing.  Bone density scan. This is done to screen for osteoporosis. You may have this done starting at age 44. Follow these instructions at home: Eating and drinking  Eat a diet that includes fresh fruits and vegetables, whole grains, lean protein, and low-fat dairy products. Limit  your intake of foods with high amounts of sugar, saturated fats, and salt.  Take vitamin and mineral supplements as recommended by your health care provider.  Do not drink alcohol if your health care provider tells you not to drink.  If you drink alcohol: ? Limit how much you have to 0-1 drink a day. ? Be aware of how much alcohol is in your drink. In the [place] one drink equals one 12 oz bottle of beer (355 mL), one 5 oz glass of wine (148 mL), or one 1 oz glass of hard liquor (44 mL). Lifestyle  Take daily care of your teeth and gums.  Stay active. Exercise for at least 30 minutes on 5 or more days each week.  Do not use any products that contain nicotine or tobacco, such as cigarettes, e-cigarettes, and chewing tobacco. If you need help quitting, ask your health care provider.  If you are sexually active, practice safe sex. Use a condom or other form of protection in order to prevent STIs (sexually transmitted infections).  Talk with your health care provider about taking a low-dose aspirin or statin. What's next?  Go to your health care provider once a year for a well check visit.  Ask your health care provider how often you should have your eyes and teeth checked.  Stay up to date on all vaccines. This information is not intended to replace advice given to you by your health care provider. Make sure you discuss any questions you have with your health care provider. Document Revised: 09/21/2018 Document Reviewed: 09/21/2018 Elsevier Patient Education  2020 [employer].

## 2020-10-16 NOTE — Patient Instructions (Signed)
Sheila Kim , Thank you for taking time to come for your Medicare Wellness Visit. I appreciate your ongoing commitment to your health goals. Please review the following plan we discussed and let me know if I can assist you in the future.   Screening recommendations/referrals: Colonoscopy: No longer required.  Mammogram: No longer required.  Bone Density: Currently due, declined order. Recommended yearly ophthalmology/optometry visit for glaucoma screening and checkup Recommended yearly dental visit for hygiene and checkup  Vaccinations: Influenza vaccine: Done 07/24/20 Pneumococcal vaccine: Completed series Tdap vaccine: Up to date, due 06/2021 Shingles vaccine: Completed series    Advanced directives: Currently on file.  Conditions/risks identified: Recommend cutting back and monitoring of dessert consumed in daily diet.  Next appointment: 2:40 PM today with Dr Beryle Flock   Preventive Care 23 Years and Older, Female Preventive care refers to lifestyle choices and visits with your health care provider that can promote health and wellness. What does preventive care include?  A yearly physical exam. This is also called an annual well check.  Dental exams once or twice a year.  Routine eye exams. Ask your health care provider how often you should have your eyes checked.  Personal lifestyle choices, including:  Daily care of your teeth and gums.  Regular physical activity.  Eating a healthy diet.  Avoiding tobacco and drug use.  Limiting alcohol use.  Practicing safe sex.  Taking low-dose aspirin every day.  Taking vitamin and mineral supplements as recommended by your health care provider. What happens during an annual well check? The services and screenings done by your health care provider during your annual well check will depend on your age, overall health, lifestyle risk factors, and family history of disease. Counseling  Your health care provider may ask you  questions about your:  Alcohol use.  Tobacco use.  Drug use.  Emotional well-being.  Home and relationship well-being.  Sexual activity.  Eating habits.  History of falls.  Memory and ability to understand (cognition).  Work and work Astronomer.  Reproductive health. Screening  You may have the following tests or measurements:  Height, weight, and BMI.  Blood pressure.  Lipid and cholesterol levels. These may be checked every 5 years, or more frequently if you are over 53 years old.  Skin check.  Lung cancer screening. You may have this screening every year starting at age 4 if you have a 30-pack-year history of smoking and currently smoke or have quit within the past 15 years.  Fecal occult blood test (FOBT) of the stool. You may have this test every year starting at age 37.  Flexible sigmoidoscopy or colonoscopy. You may have a sigmoidoscopy every 5 years or a colonoscopy every 10 years starting at age 32.  Hepatitis C blood test.  Hepatitis B blood test.  Sexually transmitted disease (STD) testing.  Diabetes screening. This is done by checking your blood sugar (glucose) after you have not eaten for a while (fasting). You may have this done every 1-3 years.  Bone density scan. This is done to screen for osteoporosis. You may have this done starting at age 43.  Mammogram. This may be done every 1-2 years. Talk to your health care provider about how often you should have regular mammograms. Talk with your health care provider about your test results, treatment options, and if necessary, the need for more tests. Vaccines  Your health care provider may recommend certain vaccines, such as:  Influenza vaccine. This is recommended every year.  Tetanus,  diphtheria, and acellular pertussis (Tdap, Td) vaccine. You may need a Td booster every 10 years.  Zoster vaccine. You may need this after age 32.  Pneumococcal 13-valent conjugate (PCV13) vaccine. One dose is  recommended after age 83.  Pneumococcal polysaccharide (PPSV23) vaccine. One dose is recommended after age 62. Talk to your health care provider about which screenings and vaccines you need and how often you need them. This information is not intended to replace advice given to you by your health care provider. Make sure you discuss any questions you have with your health care provider. Document Released: 10/24/2015 Document Revised: 06/16/2016 Document Reviewed: 07/29/2015 Elsevier Interactive Patient Education  2017 [place] Prevention in the Home Falls can cause injuries. They can happen to people of all ages. There are many things you can do to make your home safe and to help prevent falls. What can I do on the outside of my home?  Regularly fix the edges of walkways and driveways and fix any cracks.  Remove anything that might make you trip as you walk through a door, such as a raised step or threshold.  Trim any bushes or trees on the path to your home.  Use bright outdoor lighting.  Clear any walking paths of anything that might make someone trip, such as rocks or tools.  Regularly check to see if handrails are loose or broken. Make sure that both sides of any steps have handrails.  Any raised decks and porches should have guardrails on the edges.  Have any leaves, snow, or ice cleared regularly.  Use sand or salt on walking paths during winter.  Clean up any spills in your garage right away. This includes oil or grease spills. What can I do in the bathroom?  Use night lights.  Install grab bars by the toilet and in the tub and shower. Do not use towel bars as grab bars.  Use non-skid mats or decals in the tub or shower.  If you need to sit down in the shower, use a plastic, non-slip stool.  Keep the floor dry. Clean up any water that spills on the floor as soon as it happens.  Remove soap buildup in the tub or shower regularly.  Attach bath mats  securely with double-sided non-slip rug tape.  Do not have throw rugs and other things on the floor that can make you trip. What can I do in the bedroom?  Use night lights.  Make sure that you have a light by your bed that is easy to reach.  Do not use any sheets or blankets that are too big for your bed. They should not hang down onto the floor.  Have a firm chair that has side arms. You can use this for support while you get dressed.  Do not have throw rugs and other things on the floor that can make you trip. What can I do in the kitchen?  Clean up any spills right away.  Avoid walking on wet floors.  Keep items that you use a lot in easy-to-reach places.  If you need to reach something above you, use a strong step stool that has a grab bar.  Keep electrical cords out of the way.  Do not use floor polish or wax that makes floors slippery. If you must use wax, use non-skid floor wax.  Do not have throw rugs and other things on the floor that can make you trip. What can I do with  my stairs?  Do not leave any items on the stairs.  Make sure that there are handrails on both sides of the stairs and use them. Fix handrails that are broken or loose. Make sure that handrails are as long as the stairways.  Check any carpeting to make sure that it is firmly attached to the stairs. Fix any carpet that is loose or worn.  Avoid having throw rugs at the top or bottom of the stairs. If you do have throw rugs, attach them to the floor with carpet tape.  Make sure that you have a light switch at the top of the stairs and the bottom of the stairs. If you do not have them, ask someone to add them for you. What else can I do to help prevent falls?  Wear shoes that:  Do not have high heels.  Have rubber bottoms.  Are comfortable and fit you well.  Are closed at the toe. Do not wear sandals.  If you use a stepladder:  Make sure that it is fully opened. Do not climb a closed  stepladder.  Make sure that both sides of the stepladder are locked into place.  Ask someone to hold it for you, if possible.  Clearly mark and make sure that you can see:  Any grab bars or handrails.  First and last steps.  Where the edge of each step is.  Use tools that help you move around (mobility aids) if they are needed. These include:  Canes.  Walkers.  Scooters.  Crutches.  Turn on the lights when you go into a dark area. Replace any light bulbs as soon as they burn out.  Set up your furniture so you have a clear path. Avoid moving your furniture around.  If any of your floors are uneven, fix them.  If there are any pets around you, be aware of where they are.  Review your medicines with your doctor. Some medicines can make you feel dizzy. This can increase your chance of falling. Ask your doctor what other things that you can do to help prevent falls. This information is not intended to replace advice given to you by your health care provider. Make sure you discuss any questions you have with your health care provider. Document Released: 07/24/2009 Document Revised: 03/04/2016 Document Reviewed: 11/01/2014 Elsevier Interactive Patient Education  2017 [employer].

## 2020-10-16 NOTE — Assessment & Plan Note (Signed)
Unilateral dull throbbing headache for 3 months Not associated with vision changes or jaw claudication Point tenderness over temple C/f Temporal Arteritis vs. Non-specific headache Plan: ESR, SED Rate CBC w/ diff Counseled on return precautions If persists, biopsy temporal artery and treat with high dose Prednisone

## 2020-10-16 NOTE — Assessment & Plan Note (Signed)
Chronic Not on medications Continue diet and exercise Recheck A1c today

## 2020-10-17 LAB — COMPREHENSIVE METABOLIC PANEL
ALT: 16 IU/L (ref 0–32)
AST: 23 IU/L (ref 0–40)
Albumin/Globulin Ratio: 1.8 (ref 1.2–2.2)
Albumin: 4.3 g/dL (ref 3.6–4.6)
Alkaline Phosphatase: 63 IU/L (ref 44–121)
BUN/Creatinine Ratio: 12 (ref 12–28)
BUN: 15 mg/dL (ref 8–27)
Bilirubin Total: 0.9 mg/dL (ref 0.0–1.2)
CO2: 25 mmol/L (ref 20–29)
Calcium: 9.8 mg/dL (ref 8.7–10.3)
Chloride: 99 mmol/L (ref 96–106)
Creatinine, Ser: 1.23 mg/dL — ABNORMAL HIGH (ref 0.57–1.00)
GFR calc Af Amer: 48 mL/min/{1.73_m2} — ABNORMAL LOW (ref >59)
GFR calc non Af Amer: 41 mL/min/{1.73_m2} — ABNORMAL LOW (ref >59)
Globulin, Total: 2.4 g/dL (ref 1.5–4.5)
Glucose: 104 mg/dL — ABNORMAL HIGH (ref 65–99)
Potassium: 4.6 mmol/L (ref 3.5–5.2)
Sodium: 138 mmol/L (ref 134–144)
Total Protein: 6.7 g/dL (ref 6.0–8.5)

## 2020-10-17 LAB — CBC WITH DIFFERENTIAL/PLATELET
Basophils Absolute: 0.1 10*3/uL (ref 0.0–0.2)
Basos: 1 %
EOS (ABSOLUTE): 0.3 10*3/uL (ref 0.0–0.4)
Eos: 4 %
Hematocrit: 42.1 % (ref 34.0–46.6)
Hemoglobin: 14.6 g/dL (ref 11.1–15.9)
Immature Grans (Abs): 0 10*3/uL (ref 0.0–0.1)
Immature Granulocytes: 0 %
Lymphocytes Absolute: 2.4 10*3/uL (ref 0.7–3.1)
Lymphs: 27 %
MCH: 33.5 pg — ABNORMAL HIGH (ref 26.6–33.0)
MCHC: 34.7 g/dL (ref 31.5–35.7)
MCV: 97 fL (ref 79–97)
Monocytes Absolute: 0.8 10*3/uL (ref 0.1–0.9)
Monocytes: 9 %
Neutrophils Absolute: 5.2 10*3/uL (ref 1.4–7.0)
Neutrophils: 59 %
Platelets: 318 10*3/uL (ref 150–450)
RBC: 4.36 x10E6/uL (ref 3.77–5.28)
RDW: 12.1 % (ref 11.7–15.4)
WBC: 8.8 10*3/uL (ref 3.4–10.8)

## 2020-10-17 LAB — LIPID PANEL WITH LDL/HDL RATIO
Cholesterol, Total: 218 mg/dL — ABNORMAL HIGH (ref 100–199)
HDL: 48 mg/dL (ref >39)
LDL Chol Calc (NIH): 134 mg/dL — ABNORMAL HIGH (ref 0–99)
LDL/HDL Ratio: 2.8 ratio (ref 0.0–3.2)
Triglycerides: 203 mg/dL — ABNORMAL HIGH (ref 0–149)
VLDL Cholesterol Cal: 36 mg/dL (ref 5–40)

## 2020-10-17 LAB — HEMOGLOBIN A1C
Est. average glucose Bld gHb Est-mCnc: 123 mg/dL
Hgb A1c MFr Bld: 5.9 % — ABNORMAL HIGH (ref 4.8–5.6)

## 2020-10-17 LAB — C-REACTIVE PROTEIN: CRP: 2 mg/L (ref 0–10)

## 2020-10-17 LAB — SEDIMENTATION RATE: Sed Rate: 6 mm/hr (ref 0–40)

## 2020-11-05 DIAGNOSIS — Z951 Presence of aortocoronary bypass graft: Secondary | ICD-10-CM | POA: Diagnosis not present

## 2020-11-05 DIAGNOSIS — E785 Hyperlipidemia, unspecified: Secondary | ICD-10-CM | POA: Diagnosis not present

## 2020-11-05 DIAGNOSIS — I2510 Atherosclerotic heart disease of native coronary artery without angina pectoris: Secondary | ICD-10-CM | POA: Diagnosis not present

## 2020-11-05 DIAGNOSIS — I10 Essential (primary) hypertension: Secondary | ICD-10-CM | POA: Diagnosis not present

## 2020-11-05 DIAGNOSIS — N182 Chronic kidney disease, stage 2 (mild): Secondary | ICD-10-CM | POA: Diagnosis not present

## 2020-11-05 DIAGNOSIS — I213 ST elevation (STEMI) myocardial infarction of unspecified site: Secondary | ICD-10-CM | POA: Diagnosis not present

## 2020-12-19 ENCOUNTER — Other Ambulatory Visit: Payer: Self-pay | Admitting: Family Medicine

## 2020-12-19 DIAGNOSIS — I10 Essential (primary) hypertension: Secondary | ICD-10-CM

## 2020-12-23 ENCOUNTER — Other Ambulatory Visit: Payer: Self-pay | Admitting: Family Medicine

## 2020-12-23 DIAGNOSIS — E7800 Pure hypercholesterolemia, unspecified: Secondary | ICD-10-CM

## 2021-01-12 ENCOUNTER — Telehealth: Payer: Self-pay

## 2021-01-12 NOTE — Telephone Encounter (Signed)
Copied from [place] [id number]. Topic: General - Call Back - No Documentation >> Jan 12, 2021  3:46 PM Erick Blinks wrote:  Reason for CRM: Pt called and would like to know if Dr. B would approve of her receiving the [place] booster. (4th dose) patient wants to know if PCP would consider her to be immunocompromised. Please advise, call back request   Best contact: [phone]

## 2021-01-12 NOTE — Telephone Encounter (Signed)
Appointment made for patient to receive vaccine

## 2021-01-14 ENCOUNTER — Ambulatory Visit (INDEPENDENT_AMBULATORY_CARE_PROVIDER_SITE_OTHER): Payer: Medicare Other

## 2021-01-14 ENCOUNTER — Other Ambulatory Visit: Payer: Self-pay

## 2021-01-14 DIAGNOSIS — Z23 Encounter for immunization: Secondary | ICD-10-CM | POA: Diagnosis not present

## 2021-03-03 ENCOUNTER — Other Ambulatory Visit: Payer: Self-pay | Admitting: Family Medicine

## 2021-03-03 DIAGNOSIS — K219 Gastro-esophageal reflux disease without esophagitis: Secondary | ICD-10-CM

## 2021-03-03 DIAGNOSIS — K449 Diaphragmatic hernia without obstruction or gangrene: Secondary | ICD-10-CM

## 2021-03-03 DIAGNOSIS — I10 Essential (primary) hypertension: Secondary | ICD-10-CM

## 2021-03-03 NOTE — Telephone Encounter (Signed)
Requested Prescriptions  Pending Prescriptions Disp Refills  . pantoprazole (PROTONIX) 40 MG tablet [Pharmacy Med Name: PANTOPRAZOLE SOD DR 40 MG TAB] 80 tablet 0    Sig: TAKE 1 TABLET BY MOUTH TWICE A DAY     Gastroenterology: Proton Pump Inhibitors Failed - 03/03/2021 12:55 PM      Failed - Valid encounter within last 12 months    Recent Outpatient Visits          4 months ago Encounter for annual physical exam   [facility] [place], Dionne Bucy, MD   1 year ago [place], Dionne Bucy, MD   1 year ago Encounter for annual physical exam   [facility] [place], Dionne Bucy, MD   1 year ago Facial swelling   [facility] [place], Dionne Bucy, MD   1 year ago Essential hypertension   [place], Dionne Bucy, MD      Future Appointments            In 1 month Bacigalupo, Dionne Bucy, MD [facility], [place]           . propranolol (INDERAL) 40 MG tablet [Pharmacy Med Name: PROPRANOLOL 40 MG TABLET] 90 tablet 0    Sig: TAKE 1/2 TABLET BY MOUTH TWICE A DAY     Cardiovascular:  Beta Blockers Failed - 03/03/2021 12:55 PM      Failed - Last BP in normal range    BP Readings from Last 1 Encounters:  10/16/20 (!) 186/93         Failed - Valid encounter within last 6 months    Recent Outpatient Visits          4 months ago Encounter for annual physical exam   [facility], Dionne Bucy, MD   1 year ago [place], Dionne Bucy, MD   1 year ago Encounter for annual physical exam   [facility], Dionne Bucy, MD   1 year ago Facial swelling   [facility] [place], Dionne Bucy, MD   1 year ago Essential hypertension   [place], Dionne Bucy, MD      Future Appointments            In 1 month Bacigalupo, Dionne Bucy, MD [facility]           Passed - Last Heart Rate in normal range    Pulse Readings from Last 1 Encounters:  10/16/20 66         . furosemide (LASIX) 40 MG tablet [Pharmacy Med Name: FUROSEMIDE 40 MG TABLET] 90 tablet 0    Sig: TAKE 1/2 TABLET BY MOUTH TWICE A DAY     Cardiovascular:  Diuretics - Loop Failed - 03/03/2021 12:55 PM      Failed - Cr in normal range and within 360 days    Creat  Date Value Ref Range Status  09/28/2017 1.12 (H) 0.60 - 0.93 mg/dL Final    Comment:    For patients >7 years of age, the reference limit for Creatinine is approximately 13% higher for people identified as African-American. .    Creatinine, Ser  Date Value Ref Range Status  10/16/2020 1.23 (H) 0.57 - 1.00 mg/dL Final         Failed - Last BP in normal range    BP Readings from Last 1 Encounters:  10/16/20 [place])  186/93         Failed - Valid encounter within last 6 months    Recent Outpatient Visits          4 months ago Encounter for annual physical exam   [facility] [place], Dionne Bucy, MD   1 year ago [place], Dionne Bucy, MD   1 year ago Encounter for annual physical exam   [facility] [place], Dionne Bucy, MD   1 year ago Facial swelling   [facility] [place], Dionne Bucy, MD   1 year ago Essential hypertension   [place], Dionne Bucy, MD      Future Appointments            In 1 month Bacigalupo, Dionne Bucy, MD [facility], [facility]           Passed - K in normal range and within 360 days    Potassium  Date Value Ref Range Status  10/16/2020 4.6 3.5 - 5.2 mmol/L Final  01/09/2014 4.3 3.5 - 5.1 mmol/L Final         Passed - Ca in normal range and within 360 days    Calcium  Date Value Ref Range Status  10/16/2020 9.8 8.7 - 10.3 mg/dL Final   Calcium, Total  Date Value Ref Range Status  05/13/2013 8.4 (L) 8.5 - 10.1 mg/dL Final          Passed - Na in normal range and within 360 days    Sodium  Date Value Ref Range Status  10/16/2020 138 134 - 144 mmol/L Final  05/13/2013 142 136 - 145 mmol/L Final         . KLOR-CON M20 20 MEQ tablet [Pharmacy Med Name: KLOR-CON M20 TABLET] 90 tablet 0    Sig: TAKE 1 TABLET BY [place]     Endocrinology:  Minerals - Potassium Supplementation Failed - 03/03/2021 12:55 PM      Failed - Cr in normal range and within 360 days    Creat  Date Value Ref Range Status  09/28/2017 1.12 (H) 0.60 - 0.93 mg/dL Final    Comment:    For patients >96 years of age, the reference limit for Creatinine is approximately 13% higher for people identified as African-American. .    Creatinine, Ser  Date Value Ref Range Status  10/16/2020 1.23 (H) 0.57 - 1.00 mg/dL Final         Failed - Valid encounter within last 12 months    Recent Outpatient Visits          4 months ago Encounter for annual physical exam   [facility] [place], Dionne Bucy, MD   1 year ago [place], Dionne Bucy, MD   1 year ago Encounter for annual physical exam   [facility], Dionne Bucy, MD   1 year ago Facial swelling   [facility] [place], Dionne Bucy, MD   1 year ago Essential hypertension   [place], Dionne Bucy, MD      Future Appointments            In 1 month Bacigalupo, Dionne Bucy, MD [facility] [place] in normal range and within 360 days    Potassium  Date Value Ref Range Status  10/16/2020 4.6 3.5 - 5.2  mmol/L Final  01/09/2014 4.3 3.5 - 5.1 mmol/L Final

## 2021-03-04 ENCOUNTER — Other Ambulatory Visit: Payer: Self-pay | Admitting: Family Medicine

## 2021-03-04 DIAGNOSIS — I10 Essential (primary) hypertension: Secondary | ICD-10-CM

## 2021-03-04 DIAGNOSIS — K219 Gastro-esophageal reflux disease without esophagitis: Secondary | ICD-10-CM

## 2021-03-04 DIAGNOSIS — K449 Diaphragmatic hernia without obstruction or gangrene: Secondary | ICD-10-CM

## 2021-03-08 ENCOUNTER — Other Ambulatory Visit: Payer: Self-pay | Admitting: Family Medicine

## 2021-03-08 DIAGNOSIS — K219 Gastro-esophageal reflux disease without esophagitis: Secondary | ICD-10-CM

## 2021-03-08 DIAGNOSIS — I10 Essential (primary) hypertension: Secondary | ICD-10-CM

## 2021-04-06 ENCOUNTER — Other Ambulatory Visit: Payer: Self-pay | Admitting: Family Medicine

## 2021-04-06 DIAGNOSIS — Z1231 Encounter for screening mammogram for malignant neoplasm of breast: Secondary | ICD-10-CM

## 2021-04-09 ENCOUNTER — Ambulatory Visit
Admission: RE | Admit: 2021-04-09 | Discharge: 2021-04-09 | Disposition: A | Discharge status: Home / Self Care | Payer: Medicare Other | Source: Ambulatory Visit | Attending: Family Medicine | Admitting: Family Medicine

## 2021-04-09 ENCOUNTER — Other Ambulatory Visit: Payer: Self-pay

## 2021-04-09 DIAGNOSIS — Z1231 Encounter for screening mammogram for malignant neoplasm of breast: Secondary | ICD-10-CM | POA: Diagnosis not present

## 2021-04-10 ENCOUNTER — Other Ambulatory Visit: Payer: Self-pay | Admitting: Family Medicine

## 2021-04-10 DIAGNOSIS — I10 Essential (primary) hypertension: Secondary | ICD-10-CM

## 2021-04-10 DIAGNOSIS — K219 Gastro-esophageal reflux disease without esophagitis: Secondary | ICD-10-CM

## 2021-04-10 NOTE — Telephone Encounter (Signed)
Requested medication (s) are due for refill today: yes  Requested medication (s) are on the active medication list: yes  Last refill:  03/03/21 #45 0 refills  Future visit scheduled: yes  future visit in 6 days   Notes to clinic:  can patient get 30 refill or 90 day refill?     Requested Prescriptions  Pending Prescriptions Disp Refills   KLOR-CON M20 20 MEQ tablet [Pharmacy Med Name: KLOR-CON M20 TABLET] 45 tablet 0    Sig: TAKE 1 TABLET BY MOUTH EVERY DAY      Endocrinology:  Minerals - Potassium Supplementation Failed - 04/10/2021  1:30 PM      Failed - Cr in normal range and within 360 days    Creat  Date Value Ref Range Status  09/28/2017 1.12 (H) 0.60 - 0.93 mg/dL Final    Comment:    For patients >60 years of age, the reference limit for Creatinine is approximately 13% higher for people identified as African-American. .    Creatinine, Ser  Date Value Ref Range Status  10/16/2020 1.23 (H) 0.57 - 1.00 mg/dL Final          Failed - Valid encounter within last 12 months    Recent Outpatient Visits           5 months ago Encounter for annual physical exam   [facility] [place], Dionne Bucy, MD   1 year ago [place], Dionne Bucy, MD   1 year ago Encounter for annual physical exam   [facility], Dionne Bucy, MD   1 year ago Facial swelling   [facility] [place], Dionne Bucy, MD   2 years ago Essential hypertension   [place], Dionne Bucy, MD       Future Appointments             In 6 days Bacigalupo, Dionne Bucy, MD [facility] [place] in normal range and within 360 days    Potassium  Date Value Ref Range Status  10/16/2020 4.6 3.5 - 5.2 mmol/L Final  01/09/2014 4.3 3.5 - 5.1 mmol/L Final

## 2021-04-10 NOTE — Telephone Encounter (Signed)
Notes to clinic:  Patient has upcoming appt on 04/16/2021 Review for refill    Requested Prescriptions  Pending Prescriptions Disp Refills   propranolol (INDERAL) 40 MG tablet [Pharmacy Med Name: PROPRANOLOL 40 MG TABLET] 45 tablet 0    Sig: TAKE 1/2 TABLET BY MOUTH TWICE A DAY      Cardiovascular:  Beta Blockers Failed - 04/10/2021  1:34 PM      Failed - Last BP in normal range    BP Readings from Last 1 Encounters:  10/16/20 (!) 186/93          Failed - Valid encounter within last 6 months    Recent Outpatient Visits           5 months ago Encounter for annual physical exam   [facility], Dionne Bucy, MD   1 year ago [place], Dionne Bucy, MD   1 year ago Encounter for annual physical exam   [facility], Dionne Bucy, MD   1 year ago Facial swelling   [facility] [place], Dionne Bucy, MD   2 years ago Essential hypertension   [place], Dionne Bucy, MD       Future Appointments             In 6 days Bacigalupo, Dionne Bucy, MD [facility]             Passed - Last Heart Rate in normal range    Pulse Readings from Last 1 Encounters:  10/16/20 66            furosemide (LASIX) 40 MG tablet [Pharmacy Med Name: FUROSEMIDE 40 MG TABLET] 45 tablet 0    Sig: TAKE 1/2 TABLET BY MOUTH TWICE A DAY      Cardiovascular:  Diuretics - Loop Failed - 04/10/2021  1:34 PM      Failed - Cr in normal range and within 360 days    Creat  Date Value Ref Range Status  09/28/2017 1.12 (H) 0.60 - 0.93 mg/dL Final    Comment:    For patients >30 years of age, the reference limit for Creatinine is approximately 13% higher for people identified as African-American. .    Creatinine, Ser  Date Value Ref Range Status  10/16/2020 1.23 (H) 0.57 - 1.00 mg/dL Final          Failed - Last BP in normal range    BP Readings from Last 1  Encounters:  10/16/20 (!) 186/93          Failed - Valid encounter within last 6 months    Recent Outpatient Visits           5 months ago Encounter for annual physical exam   [facility] [place], Dionne Bucy, MD   1 year ago [place], Dionne Bucy, MD   1 year ago Encounter for annual physical exam   [facility] [place], Dionne Bucy, MD   1 year ago Facial swelling   [facility] [place], Dionne Bucy, MD   2 years ago Essential hypertension   [place], Dionne Bucy, MD       Future Appointments             In 6 days Bacigalupo, Dionne Bucy, MD [facility] [place] in  normal range and within 360 days    Potassium  Date Value Ref Range Status  10/16/2020 4.6 3.5 - 5.2 mmol/L Final  01/09/2014 4.3 3.5 - 5.1 mmol/L Final          Passed - Ca in normal range and within 360 days    Calcium  Date Value Ref Range Status  10/16/2020 9.8 8.7 - 10.3 mg/dL Final   Calcium, Total  Date Value Ref Range Status  05/13/2013 8.4 (L) 8.5 - 10.1 mg/dL Final          Passed - Na in normal range and within 360 days    Sodium  Date Value Ref Range Status  10/16/2020 138 134 - 144 mmol/L Final  05/13/2013 142 136 - 145 mmol/L Final            pantoprazole (PROTONIX) 40 MG tablet [Pharmacy Med Name: PANTOPRAZOLE SOD DR 40 MG TAB] 90 tablet 0    Sig: TAKE 1 TABLET BY MOUTH TWICE A DAY      Gastroenterology: Proton Pump Inhibitors Failed - 04/10/2021  1:34 PM      Failed - Valid encounter within last 12 months    Recent Outpatient Visits           5 months ago Encounter for annual physical exam   [facility], Dionne Bucy, MD   1 year ago [place], Dionne Bucy, MD   1 year ago Encounter for annual physical exam   [facility] [place], Dionne Bucy, MD   1 year ago Facial swelling   [facility] [place], Dionne Bucy, MD   2 years ago Essential hypertension   [place], Dionne Bucy, MD       Future Appointments             In 6 days Bacigalupo, Dionne Bucy, MD [facility] [place]

## 2021-04-16 ENCOUNTER — Other Ambulatory Visit: Payer: Self-pay

## 2021-04-16 ENCOUNTER — Encounter: Payer: Self-pay | Admitting: Family Medicine

## 2021-04-16 ENCOUNTER — Ambulatory Visit (INDEPENDENT_AMBULATORY_CARE_PROVIDER_SITE_OTHER): Payer: Medicare Other | Admitting: Family Medicine

## 2021-04-16 VITALS — BP 135/79 | HR 72 | Temp 98.3°F | Resp 16 | Ht <= 58 in | Wt 119.1 lb

## 2021-04-16 DIAGNOSIS — I10 Essential (primary) hypertension: Secondary | ICD-10-CM

## 2021-04-16 DIAGNOSIS — L570 Actinic keratosis: Secondary | ICD-10-CM

## 2021-04-16 DIAGNOSIS — I130 Hypertensive heart and chronic kidney disease with heart failure and stage 1 through stage 4 chronic kidney disease, or unspecified chronic kidney disease: Secondary | ICD-10-CM

## 2021-04-16 DIAGNOSIS — E7800 Pure hypercholesterolemia, unspecified: Secondary | ICD-10-CM

## 2021-04-16 DIAGNOSIS — K219 Gastro-esophageal reflux disease without esophagitis: Secondary | ICD-10-CM

## 2021-04-16 DIAGNOSIS — N182 Chronic kidney disease, stage 2 (mild): Secondary | ICD-10-CM

## 2021-04-16 DIAGNOSIS — R7303 Prediabetes: Secondary | ICD-10-CM

## 2021-04-16 DIAGNOSIS — K449 Diaphragmatic hernia without obstruction or gangrene: Secondary | ICD-10-CM

## 2021-04-16 LAB — POCT GLYCOSYLATED HEMOGLOBIN (HGB A1C)
Est. average glucose Bld gHb Est-mCnc: 126
Hemoglobin A1C: 6 % — AB (ref 4.0–5.6)

## 2021-04-16 MED ORDER — PANTOPRAZOLE SODIUM 40 MG PO TBEC: 40.0000 mg | DELAYED_RELEASE_TABLET | Freq: Two times a day (BID) | ORAL | 3 refills | Status: DC

## 2021-04-16 NOTE — Assessment & Plan Note (Signed)
Recommend low carb diet Reviewed A1c  

## 2021-04-16 NOTE — Assessment & Plan Note (Signed)
Risk factor control as above

## 2021-04-16 NOTE — Patient Instructions (Signed)

## 2021-04-16 NOTE — Assessment & Plan Note (Signed)
Well controlled Continue current medications Recheck metabolic panel F/u in 6 months  

## 2021-04-16 NOTE — Assessment & Plan Note (Signed)
Chronic and stable Continue to monitor Creatinine

## 2021-04-16 NOTE — Progress Notes (Signed)
Established patient visit   Patient: Sheila Kim   DOB: [birth year]   80 y.o. Female  MRN: [id number] Visit Date: 04/16/2021  Today's healthcare provider: Lavon Paganini, MD   Chief Complaint  Patient presents with   Hypertension   Hyperlipidemia   Hyperglycemia   Subjective    Hypertension Pertinent negatives include no chest pain, headaches, neck pain, palpitations or shortness of breath.  Hyperlipidemia Pertinent negatives include no chest pain, myalgias or shortness of breath.  Hyperglycemia Pertinent negatives include no abdominal pain, chest pain, chills, coughing, fatigue, fever, headaches, myalgias, nausea, neck pain, numbness, sore throat, vomiting or weakness.    Hypertension, follow-up  BP Readings from Last 3 Encounters:  04/16/21 135/79  10/16/20 (!) 186/93  02/28/20 137/75   Wt Readings from Last 3 Encounters:  04/16/21 119 lb 1.6 oz (54 kg)  10/16/20 120 lb 3.2 oz (54.5 kg)  02/28/20 119 lb (54 kg)     She was last seen for hypertension 6 months ago.  BP at that visit was 186/93. Management since that visit includes no changes. Her home reading today was 139/75.  She is tolerating 5 mg crestor. She reports excellent compliance with treatment. She is not having side effects. She is following a Regular diet. She is exercising. She does not smoke.  Use of agents associated with hypertension: none.   Outside blood pressures are stable. Symptoms: No chest pain No chest pressure  No palpitations No syncope  No dyspnea No orthopnea  No paroxysmal nocturnal dyspnea Yes lower extremity edema   Pertinent labs: Lab Results  Component Value Date   CHOL 218 (H) 10/16/2020   HDL 48 10/16/2020   LDLCALC 134 (H) 10/16/2020   TRIG 203 (H) 10/16/2020   CHOLHDL 4.3 02/28/2020   Lab Results  Component Value Date   NA 138 10/16/2020   K 4.6 10/16/2020   CREATININE 1.23 (H) 10/16/2020   GFRNONAA 41 (L) 10/16/2020   GFRAA 48 (L)  10/16/2020   GLUCOSE 104 (H) 10/16/2020     The ASCVD Risk score Mikey Bussing DC Jr., et al., 2013) failed to calculate for the following reasons:   The 2013 ASCVD risk score is only valid for ages 85 to 75   The patient has a prior MI or stroke diagnosis   --------------------------------------------------------------------------------------------------- Lipid/Cholesterol, Follow-up  Last lipid panel Other pertinent labs  Lab Results  Component Value Date   CHOL 218 (H) 10/16/2020   HDL 48 10/16/2020   LDLCALC 134 (H) 10/16/2020   TRIG 203 (H) 10/16/2020   CHOLHDL 4.3 02/28/2020   Lab Results  Component Value Date   ALT 16 10/16/2020   AST 23 10/16/2020   PLT 318 10/16/2020   TSH 0.865 03/23/2016     She was last seen for this 6 months ago.  Management since that visit includes no changes. She is tolerating 40 mg propranolol.  She reports excellent compliance with treatment. She is not having side effects.   Symptoms: No chest pain No chest pressure/discomfort  No dyspnea Yes lower extremity edema  No numbness or tingling of extremity No orthopnea  No palpitations No paroxysmal nocturnal dyspnea  No speech difficulty No syncope   Current diet: in general, a "healthy" diet   Current exercise: walking and bed exercise, chair exercise and 3 lbs weight exercise  The ASCVD Risk score Mikey Bussing DC Jr., et al., 2013) failed to calculate for the following reasons:   The 2013 ASCVD risk score is  only valid for ages 43 to 65   The patient has a prior MI or stroke diagnosis  --------------------------------------------------------------------------------------------------- Prediabetes, Follow-up  Lab Results  Component Value Date   HGBA1C 6.0 (A) 04/16/2021   HGBA1C 5.9 (H) 10/16/2020   HGBA1C 6.0 (H) 02/28/2020   GLUCOSE 104 (H) 10/16/2020   GLUCOSE 103 (H) 02/28/2020   GLUCOSE 113 (H) 10/02/2019    Last seen for for this 6 months ago.  Management since that visit includes no  changes. Current symptoms include none and have been stable.   Prior visit with dietician: no Current diet: in general, a "healthy" diet   Current exercise: walking  Actinic keratosis She has an area of rough skin on her nose that she has had for 3 months. She has more on other areas of her face that has been there for years. She currently doesn't have a dermatologist, her previous provider has since retired. She denies itchiness, bleeding or pain to the area.   Screening  Mammogram 04/09/2021-normal   Pertinent Labs:    Component Value Date/Time   CHOL 218 (H) 10/16/2020 1608   TRIG 203 (H) 10/16/2020 1608   CHOLHDL 4.3 02/28/2020 1355   CHOLHDL 4.2 09/28/2017 0840   CREATININE 1.23 (H) 10/16/2020 1608   CREATININE 1.12 (H) 09/28/2017 0840    Wt Readings from Last 3 Encounters:  04/16/21 119 lb 1.6 oz (54 kg)  10/16/20 120 lb 3.2 oz (54.5 kg)  02/28/20 119 lb (54 kg)   -----------------------------------------------------------------------------------------  Patient Active Problem List   Diagnosis Date Noted   Chronic nonintractable headache 10/16/2020   Pain in joint of left wrist 03/22/2019   Tinea corporis 09/17/2015   Allergic rhinitis 02/12/2015   Atherosclerosis of coronary artery 02/12/2015   Chronic kidney disease (CKD), stage II (mild) 02/12/2015   Colitis 02/12/2015   DD (diverticular disease) 02/12/2015   Gastroesophageal reflux disease with hiatal hernia 02/12/2015   Personal history of methicillin resistant Staphylococcus aureus 02/12/2015   H/O arthrodesis 02/12/2015   Hypercholesteremia 02/12/2015   Prediabetes 02/12/2015   BP (high blood pressure) 02/12/2015   Heart & renal disease, hypertensive, 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[place]) 02/12/2015   Arthritis, degenerative 02/12/2015   OP (osteoporosis) 02/12/2015   Avitaminosis D 02/12/2015   Myocardial infarction ([place]) 04/15/2014   Pleural cavity effusion 06/08/2013   Disorder of right ventricle of heart  05/20/2013   H/O coronary artery bypass surgery 05/17/2013   History of knee surgery 05/16/2013   Social History   Tobacco Use   Smoking status: Never   Smokeless tobacco: Never  Vaping Use   Vaping Use: Never used  Substance Use Topics   Alcohol use: No   Drug use: No   Allergies  Allergen Reactions   Alcohol     Gastric pain   Gelatin Capsule Empty  [Gelatin]     Stomach pains   Gemfibrozil     Stomach pains, urine retention and muscle weakness.   Meloxicam     Drastically alters taste   Nsaids Other (See Comments)    Tolerates aspirin stomach pains and reflux   Oxycodone Itching    Trembling   Simvastatin Other (See Comments)    Feet and leg pain and general body weakness.   Sulfa Antibiotics Nausea And Vomiting and Other (See Comments)   Clarithromycin Rash and Nausea And Vomiting    Terrible taste in mouth and stomach ache.   Lopressor  [Metoprolol] Palpitations   Niacin Other (See Comments) and Rash  Flushing   Ofloxacin Rash and Nausea Only                                                                                            Medications: Outpatient Medications Prior to Visit  Medication Sig   acetaminophen (TYLENOL) 500 MG tablet Take 500 mg by mouth. In the PM and as needed   acetaminophen (TYLENOL) 650 MG CR tablet Take 650 mg by mouth. 2 tablets in the AM and 1 tablet in the PM   Alpha-D-Galactosidase (BEANO TO GO) TABS Take by mouth daily at 6 (six) AM. occasionally   aspirin 81 MG tablet Take 81 mg by mouth daily.    CALCIUM CITRATE PO Take 315 mg by mouth 2 (two) times daily.    cholecalciferol (VITAMIN D) 1000 UNITS tablet Take 1,000 Units by mouth daily.    Coenzyme Q10 (COQ10) 100 MG CAPS Take 200 mg by mouth daily.    cyanocobalamin 1000 MCG tablet Take 1,000 mcg by mouth daily.    famotidine (PEPCID) 20 MG tablet Take 20 mg by mouth 2 (two) times daily.   ferrous sulfate 325 (65 FE) MG tablet Take 325 mg by mouth daily  with breakfast.    fluticasone (FLONASE) 50 MCG/ACT nasal spray Place 2 sprays into both nostrils daily.   folic acid (FOLVITE) 1 MG tablet Take 1 mg by mouth daily.    furosemide (LASIX) 40 MG tablet TAKE 1/2 TABLET BY MOUTH TWICE A DAY   ketotifen (ZADITOR) 0.025 % ophthalmic solution Place 1 drop into both eyes as needed.   loratadine (CLARITIN) 10 MG tablet Take 10 mg by mouth daily.    MELATONIN EXTRA STRENGTH PO Take 0.25 tablets by mouth at bedtime as needed.    Multiple Vitamins-Minerals ([employer] SPECTRAVITE ADULT 50+ PO) Education officer, community - Historical Medication  daily Active   Polyethyl Glycol-Propyl Glycol 0.4-0.3 % SOLN Apply to eye as needed.   polyethylene glycol (MIRALAX / GLYCOLAX) packet Take 17 g by mouth daily.   potassium chloride SA (KLOR-CON M20) 20 MEQ tablet TAKE 1 TABLET BY MOUTH EVERY DAY   Probiotic Product ([employer]S SENIOR PROBIOTIC) CAPS Take by mouth daily.    propranolol (INDERAL) 40 MG tablet TAKE 1/2 TABLET BY MOUTH TWICE A DAY   Psyllium Husk POWD Take by mouth daily.    pyridoxine (B-6) 100 MG tablet Take 100 mg by mouth daily.    rosuvastatin (CRESTOR) 5 MG tablet TAKE 1 TABLET DAILY EXCEPT THURSDAY AND SUNDAY   [DISCONTINUED] pantoprazole (PROTONIX) 40 MG tablet TAKE 1 TABLET BY MOUTH TWICE A DAY   [DISCONTINUED] diclofenac sodium (VOLTAREN) 1 % GEL Apply 2 g topically 4 (four) times daily.   No facility-administered medications prior to visit.   Review of Systems  Constitutional:  Negative for activity change, appetite change, chills, fatigue and fever.  HENT:  Negative for ear pain, sinus pressure, sinus pain and sore throat.   Eyes:  Negative for pain and visual disturbance.  Respiratory:  Negative for cough, chest tightness, shortness of breath and wheezing.   Cardiovascular:  Negative for chest pain, palpitations and  leg swelling.  Gastrointestinal:  Negative for abdominal pain, blood in stool, diarrhea, nausea and vomiting.   Genitourinary:  Negative for flank pain, frequency, pelvic pain and urgency.  Musculoskeletal:  Negative for back pain, myalgias and neck pain.  Neurological:  Negative for dizziness, weakness, light-headedness, numbness and headaches.       Objective    BP 135/79 Comment: home reading  Pulse 72   Temp 98.3 F (36.8 C) (Oral)   Resp 16   Ht 4\' 8"  (1.422 m)   Wt 119 lb 1.6 oz (54 kg)   SpO2 97%   BMI 26.70 kg/m  BP Readings from Last 3 Encounters:  04/16/21 135/79  10/16/20 (!) 186/93  02/28/20 137/75   Wt Readings from Last 3 Encounters:  04/16/21 119 lb 1.6 oz (54 kg)  10/16/20 120 lb 3.2 oz (54.5 kg)  02/28/20 119 lb (54 kg)    Physical Exam Vitals reviewed.  Constitutional:      General: She is not in acute distress.    Appearance: Normal appearance. She is well-developed. She is not diaphoretic.  HENT:     Head: Normocephalic and atraumatic.  Eyes:     General: No scleral icterus.    Conjunctiva/sclera: Conjunctivae normal.  Neck:     Thyroid: No thyromegaly.  Cardiovascular:     Rate and Rhythm: Normal rate and regular rhythm.     Pulses: Normal pulses.     Heart sounds: Normal heart sounds. No murmur heard. Pulmonary:     Effort: Pulmonary effort is normal. No respiratory distress.     Breath sounds: Normal breath sounds. No wheezing, rhonchi or rales.  Musculoskeletal:     Cervical back: Neck supple.     Right lower leg: No edema.     Left lower leg: No edema.  Lymphadenopathy:     Cervical: No cervical adenopathy.  Skin:    General: Skin is warm and dry.     Findings: No rash.  Neurological:     Mental Status: She is alert and oriented to person, place, and time. Mental status is at baseline.  Psychiatric:        Mood and Affect: Mood normal.        Behavior: Behavior normal.    Results for orders placed or performed in visit on 04/16/21  POCT glycosylated hemoglobin (Hb A1C)  Result Value Ref Range   Hemoglobin A1C 6.0 (A) 4.0 - 5.6 %    Est. average glucose Bld gHb Est-mCnc 126     Assessment & Plan     Problem List Items Addressed This Visit       Cardiovascular and Mediastinum   BP (high blood pressure) - Primary    Well controlled Continue current medications Recheck metabolic panel F/u in 6 months        Relevant Orders   Comprehensive metabolic panel   Heart & renal disease, hypertensive, 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HCC)    Risk factor control as above       Relevant Orders   Comprehensive metabolic panel   Lipid panel     Respiratory   Gastroesophageal reflux disease with hiatal hernia   Relevant Medications   pantoprazole (PROTONIX) 40 MG tablet     Genitourinary   Chronic kidney disease (CKD), stage II (mild)    Chronic and stable Continue to monitor Creatinine         Other   Hypercholesteremia    Previously well controlled Continue statin Repeat FLP and CMP  Relevant Orders   Comprehensive metabolic panel   Lipid panel   Prediabetes    Recommend low carb diet Reviewed A1c        Relevant Orders   POCT glycosylated hemoglobin (Hb A1C) (Completed)   Other Visit Diagnoses     AK (actinic keratosis)       Relevant Orders   Ambulatory referral to Dermatology      Return in about 6 months (around 10/17/2021) for AWV, CPE.      I,Essence Turner,acting as a scribe for Lavon Paganini, MD.,have documented all relevant documentation on the behalf of Lavon Paganini, MD,as directed by  Lavon Paganini, MD while in the presence of Lavon Paganini, MD.  I, Lavon Paganini, MD, have reviewed all documentation for this visit. The documentation on 04/16/21 for the exam, diagnosis, procedures, and orders are all accurate and complete.   Griffin Dewilde, Dionne Bucy, MD, MPH [facility]

## 2021-04-16 NOTE — Assessment & Plan Note (Signed)
Previously well controlled Continue statin Repeat FLP and CMP  

## 2021-04-17 LAB — COMPREHENSIVE METABOLIC PANEL
ALT: 13 IU/L (ref 0–32)
AST: 18 IU/L (ref 0–40)
Albumin/Globulin Ratio: 2.1 (ref 1.2–2.2)
Albumin: 4.6 g/dL (ref 3.6–4.6)
Alkaline Phosphatase: 58 IU/L (ref 44–121)
BUN/Creatinine Ratio: 13 (ref 12–28)
BUN: 17 mg/dL (ref 8–27)
Bilirubin Total: 1 mg/dL (ref 0.0–1.2)
CO2: 25 mmol/L (ref 20–29)
Calcium: 9.9 mg/dL (ref 8.7–10.3)
Chloride: 99 mmol/L (ref 96–106)
Creatinine, Ser: 1.29 mg/dL — ABNORMAL HIGH (ref 0.57–1.00)
Globulin, Total: 2.2 g/dL (ref 1.5–4.5)
Glucose: 100 mg/dL — ABNORMAL HIGH (ref 65–99)
Potassium: 4.4 mmol/L (ref 3.5–5.2)
Sodium: 141 mmol/L (ref 134–144)
Total Protein: 6.8 g/dL (ref 6.0–8.5)
eGFR: 41 mL/min/{1.73_m2} — ABNORMAL LOW (ref >59)

## 2021-04-17 LAB — LIPID PANEL
Chol/HDL Ratio: 4.2 ratio (ref 0.0–4.4)
Cholesterol, Total: 217 mg/dL — ABNORMAL HIGH (ref 100–199)
HDL: 52 mg/dL (ref >39)
LDL Chol Calc (NIH): 129 mg/dL — ABNORMAL HIGH (ref 0–99)
Triglycerides: 201 mg/dL — ABNORMAL HIGH (ref 0–149)
VLDL Cholesterol Cal: 36 mg/dL (ref 5–40)

## 2021-06-03 ENCOUNTER — Other Ambulatory Visit: Payer: Self-pay | Admitting: Family Medicine

## 2021-06-03 DIAGNOSIS — E7800 Pure hypercholesterolemia, unspecified: Secondary | ICD-10-CM

## 2021-06-03 DIAGNOSIS — Z951 Presence of aortocoronary bypass graft: Secondary | ICD-10-CM | POA: Diagnosis not present

## 2021-06-03 DIAGNOSIS — E785 Hyperlipidemia, unspecified: Secondary | ICD-10-CM | POA: Diagnosis not present

## 2021-06-03 DIAGNOSIS — I21A1 Myocardial infarction type 2: Secondary | ICD-10-CM | POA: Diagnosis not present

## 2021-06-03 DIAGNOSIS — I10 Essential (primary) hypertension: Secondary | ICD-10-CM | POA: Diagnosis not present

## 2021-06-03 DIAGNOSIS — I2510 Atherosclerotic heart disease of native coronary artery without angina pectoris: Secondary | ICD-10-CM | POA: Diagnosis not present

## 2021-06-03 NOTE — Telephone Encounter (Signed)
Requested Prescriptions  Pending Prescriptions Disp Refills  . rosuvastatin (CRESTOR) 5 MG tablet [Pharmacy Med Name: ROSUVASTATIN CALCIUM 5 MG TAB] 90 tablet 1    Sig: TAKE 1 TABLET DAILY EXCEPT THURSDAY AND SUNDAY     Cardiovascular:  Antilipid - Statins Failed - 06/03/2021  1:23 AM      Failed - Total Cholesterol in normal range and within 360 days    Cholesterol, Total  Date Value Ref Range Status  04/16/2021 217 (H) 100 - 199 mg/dL Final         Failed - LDL in normal range and within 360 days    LDL Cholesterol (Calc)  Date Value Ref Range Status  09/28/2017 125 (H) mg/dL (calc) Final    Comment:    Reference range: <100 . Desirable range <100 mg/dL for primary prevention;   <70 mg/dL for patients with CHD or diabetic patients  with > or = 2 CHD risk factors. Marland Kitchen LDL-C is now calculated using the Martin-Hopkins  calculation, which is a validated novel method providing  better accuracy than the Friedewald equation in the  estimation of LDL-C.  Cresenciano Genre et al. Annamaria Helling. [id number]): 2061-2068  (http://education.[employer].com/faq/FAQ164)    LDL Chol Calc (NIH)  Date Value Ref Range Status  04/16/2021 129 (H) 0 - 99 mg/dL Final         Failed - Triglycerides in normal range and within 360 days    Triglycerides  Date Value Ref Range Status  04/16/2021 201 (H) 0 - 149 mg/dL Final         Passed - HDL in normal range and within 360 days    HDL  Date Value Ref Range Status  04/16/2021 52 >39 mg/dL Final         Passed - Patient is not pregnant      Passed - Valid encounter within last 12 months    Recent Outpatient Visits          1 month ago Primary hypertension   [facility] [place], Dionne Bucy, MD   7 months ago Encounter for annual physical exam   [facility] [place], Dionne Bucy, MD   1 year ago [place], Dionne Bucy, MD   1 year ago Encounter for annual physical exam    [facility] [place], Dionne Bucy, MD   1 year ago Facial swelling   [facility] [place], Dionne Bucy, MD      Future Appointments            In 4 months Ralene Bathe, MD [place]   In 5 months [place], Dionne Bucy, MD [facility] [place]

## 2021-07-13 ENCOUNTER — Telehealth: Payer: Self-pay

## 2021-07-13 DIAGNOSIS — E7800 Pure hypercholesterolemia, unspecified: Secondary | ICD-10-CM

## 2021-07-13 DIAGNOSIS — I10 Essential (primary) hypertension: Secondary | ICD-10-CM

## 2021-07-13 MED ORDER — ROSUVASTATIN CALCIUM 5 MG PO TABS
ORAL_TABLET | ORAL | 3 refills | Status: DC
Start: 2021-07-13 — End: 2021-07-16

## 2021-07-13 MED ORDER — PROPRANOLOL HCL 40 MG PO TABS: 20.0000 mg | ORAL_TABLET | Freq: Two times a day (BID) | ORAL | 3 refills | Status: DC

## 2021-07-13 NOTE — Telephone Encounter (Signed)
[facility] faxed refill request for the following medications:   rosuvastatin (CRESTOR) 5 MG tablet   Please advise.

## 2021-07-13 NOTE — Telephone Encounter (Signed)
[facility] faxed refill request for the following medications:   propranolol (INDERAL) 40 MG tablet   KLOR-Con M   Please advise.

## 2021-07-13 NOTE — Telephone Encounter (Signed)
Copied from [place] [id number]. Topic: General - Other >> Jul 13, 2021  9:45 AM Rayann Heman wrote: Reason for CRM: Pt called and stated that [facility] is sending a fax and would like [place] to keep an eye out for it. Thanks. [facility] # [phone]

## 2021-07-14 MED ORDER — POTASSIUM CHLORIDE CRYS ER 20 MEQ PO TBCR: 20.0000 meq | EXTENDED_RELEASE_TABLET | Freq: Every day | ORAL | 0 refills | Status: DC

## 2021-07-14 NOTE — Addendum Note (Signed)
Addended by: Minette Headland on: 07/14/2021 10:03 AM   Modules accepted: Orders

## 2021-07-15 ENCOUNTER — Other Ambulatory Visit: Payer: Self-pay

## 2021-07-15 ENCOUNTER — Ambulatory Visit (INDEPENDENT_AMBULATORY_CARE_PROVIDER_SITE_OTHER): Payer: Medicare Other

## 2021-07-15 DIAGNOSIS — Z23 Encounter for immunization: Secondary | ICD-10-CM | POA: Diagnosis not present

## 2021-07-16 ENCOUNTER — Other Ambulatory Visit: Payer: Self-pay | Admitting: Family Medicine

## 2021-07-16 ENCOUNTER — Telehealth: Payer: Self-pay | Admitting: Family Medicine

## 2021-07-16 ENCOUNTER — Other Ambulatory Visit: Payer: Self-pay

## 2021-07-16 DIAGNOSIS — K449 Diaphragmatic hernia without obstruction or gangrene: Secondary | ICD-10-CM

## 2021-07-16 DIAGNOSIS — K219 Gastro-esophageal reflux disease without esophagitis: Secondary | ICD-10-CM

## 2021-07-16 DIAGNOSIS — E7800 Pure hypercholesterolemia, unspecified: Secondary | ICD-10-CM

## 2021-07-16 DIAGNOSIS — I10 Essential (primary) hypertension: Secondary | ICD-10-CM

## 2021-07-16 MED ORDER — PANTOPRAZOLE SODIUM 40 MG PO TBEC: 40.0000 mg | DELAYED_RELEASE_TABLET | Freq: Two times a day (BID) | ORAL | 3 refills | Status: DC

## 2021-07-16 MED ORDER — FUROSEMIDE 40 MG PO TABS: 20.0000 mg | ORAL_TABLET | Freq: Two times a day (BID) | ORAL | 0 refills | Status: DC

## 2021-07-16 NOTE — Telephone Encounter (Signed)
Requested medication (s) are due for refill today -no  Requested medication (s) are on the active medication list - yes  Future visit scheduled - yes  Last refill: 3 days ago  Notes to clinic: Call to pharmacy: medication requested early- set to be released:                                                       Crestor, Potassium- 10/26                                                       Propanolol- 10/14 Call to patient- left message regarding above -advised call office with concerns  Requested Prescriptions  Pending Prescriptions Disp Refills   rosuvastatin (CRESTOR) 5 MG tablet 90 tablet 3    Sig: TAKE 1 TABLET DAILY EXCEPT THURSDAY AND SUNDAY     Cardiovascular:  Antilipid - Statins Failed - 07/16/2021 11:40 AM      Failed - Total Cholesterol in normal range and within 360 days    Cholesterol, Total  Date Value Ref Range Status  04/16/2021 217 (H) 100 - 199 mg/dL Final          Failed - LDL in normal range and within 360 days    LDL Cholesterol (Calc)  Date Value Ref Range Status  09/28/2017 125 (H) mg/dL (calc) Final    Comment:    Reference range: <100 . Desirable range <100 mg/dL for primary prevention;   <70 mg/dL for patients with CHD or diabetic patients  with > or = 2 CHD risk factors. Marland Kitchen LDL-C is now calculated using the Martin-Hopkins  calculation, which is a validated novel method providing  better accuracy than the Friedewald equation in the  estimation of LDL-C.  Cresenciano Genre et al. Annamaria Helling. 5726;203(55): 2061-2068  (http://education.[facility].com/faq/FAQ164)    LDL Chol Calc (NIH)  Date Value Ref Range Status  04/16/2021 129 (H) 0 - 99 mg/dL Final          Failed - Triglycerides in normal range and within 360 days    Triglycerides  Date Value Ref Range Status  04/16/2021 201 (H) 0 - 149 mg/dL Final          Passed - HDL in normal range and within 360 days    HDL  Date Value Ref Range Status  04/16/2021 52 >39 mg/dL Final           Passed - Patient is not pregnant      Passed - Valid encounter within last 12 months    Recent Outpatient Visits           3 months ago Primary hypertension   [place], Dionne Bucy, MD   9 months ago Encounter for annual physical exam   [facility] [place], Dionne Bucy, MD   1 year ago Hypercholesteremia   [facility] [place], Dionne Bucy, MD   1 year ago Encounter for annual physical exam   [facility] [place], Dionne Bucy, MD   2 years ago Facial swelling   [facility] [place], Dionne Bucy, MD       Future Appointments  In 3 months Ralene Bathe, MD [place]   In 3 months Bacigalupo, Dionne Bucy, MD [facility]             potassium chloride SA (KLOR-CON M20) 20 MEQ tablet 90 tablet 0    Sig: Take 1 tablet (20 mEq total) by mouth daily.     Endocrinology:  Minerals - Potassium Supplementation Failed - 07/16/2021 11:40 AM      Failed - Cr in normal range and within 360 days    Creat  Date Value Ref Range Status  09/28/2017 1.12 (H) 0.60 - 0.93 mg/dL Final    Comment:    For patients >48 years of age, the reference limit for Creatinine is approximately 13% higher for people identified as African-American. .    Creatinine, Ser  Date Value Ref Range Status  04/16/2021 1.29 (H) 0.57 - 1.00 mg/dL Final          Passed - K in normal range and within 360 days    Potassium  Date Value Ref Range Status  04/16/2021 4.4 3.5 - 5.2 mmol/L Final  01/09/2014 4.3 3.5 - 5.1 mmol/L Final          Passed - Valid encounter within last 12 months    Recent Outpatient Visits           3 months ago Primary hypertension   [facility] [place], Dionne Bucy, MD   9 months ago Encounter for annual physical exam   [facility] [place], Dionne Bucy, MD   1 year ago [place], Dionne Bucy, MD   1 year ago Encounter for annual physical exam   [facility] [place], Dionne Bucy, MD   2 years ago Facial swelling   [place], Dionne Bucy, MD       Future Appointments             In 3 months Ralene Bathe, MD [place]   In 3 months Bacigalupo, Dionne Bucy, MD [facility]             propranolol (INDERAL) 40 MG tablet 90 tablet 3    Sig: Take 0.5 tablets (20 mg total) by mouth 2 (two) times daily.     Cardiovascular:  Beta Blockers Passed - 07/16/2021 11:40 AM      Passed - Last BP in normal range    BP Readings from Last 1 Encounters:  04/16/21 135/79          Passed - Last Heart Rate in normal range    Pulse Readings from Last 1 Encounters:  04/16/21 72          Passed - Valid encounter within last 6 months    Recent Outpatient Visits           3 months ago Primary hypertension   [facility] [place], Dionne Bucy, MD   9 months ago Encounter for annual physical exam   [facility] [place], Dionne Bucy, MD   1 year ago [place], Dionne Bucy, MD   1 year ago Encounter for annual physical exam   [facility] [place], Dionne Bucy, MD   2 years ago Facial swelling   [facility] [place], Dionne Bucy, MD       Future Appointments             In  3 months Ralene Bathe, MD [place]   In 3 months Bacigalupo, Dionne Bucy, MD [facility]            Signed Prescriptions Disp Refills   furosemide (LASIX) 40 MG tablet 90 tablet 0    Sig: Take 0.5 tablets (20 mg total) by mouth 2 (two) times daily.     Cardiovascular:  Diuretics - Loop Failed - 07/16/2021 11:40 AM      Failed - Cr in normal range and within 360 days    Creat  Date Value Ref Range Status  09/28/2017 1.12 (H) 0.60 - 0.93 mg/dL Final    Comment:    For  patients >16 years of age, the reference limit for Creatinine is approximately 13% higher for people identified as African-American. .    Creatinine, Ser  Date Value Ref Range Status  04/16/2021 1.29 (H) 0.57 - 1.00 mg/dL Final          Passed - K in normal range and within 360 days    Potassium  Date Value Ref Range Status  04/16/2021 4.4 3.5 - 5.2 mmol/L Final  01/09/2014 4.3 3.5 - 5.1 mmol/L Final          Passed - Ca in normal range and within 360 days    Calcium  Date Value Ref Range Status  04/16/2021 9.9 8.7 - 10.3 mg/dL Final   Calcium, Total  Date Value Ref Range Status  05/13/2013 8.4 (L) 8.5 - 10.1 mg/dL Final          Passed - Na in normal range and within 360 days    Sodium  Date Value Ref Range Status  04/16/2021 141 134 - 144 mmol/L Final  05/13/2013 142 136 - 145 mmol/L Final          Passed - Last BP in normal range    BP Readings from Last 1 Encounters:  04/16/21 135/79          Passed - Valid encounter within last 6 months    Recent Outpatient Visits           3 months ago Primary hypertension   [facility] [place], Dionne Bucy, MD   9 months ago Encounter for annual physical exam   [facility] [place], Dionne Bucy, MD   1 year ago [place], Dionne Bucy, MD   1 year ago Encounter for annual physical exam   [facility] [place], Dionne Bucy, MD   2 years ago Facial swelling   [facility] [place], Dionne Bucy, MD       Future Appointments             In 3 months Ralene Bathe, MD [place]   In 3 months Bacigalupo, Dionne Bucy, MD [facility]               Requested Prescriptions  Pending Prescriptions Disp Refills   rosuvastatin (CRESTOR) 5 MG tablet 90 tablet 3    Sig: TAKE 1 TABLET DAILY EXCEPT THURSDAY AND SUNDAY     Cardiovascular:  Antilipid - Statins Failed - 07/16/2021 11:40 AM       Failed - Total Cholesterol in normal range and within 360 days    Cholesterol, Total  Date Value Ref Range Status  04/16/2021 217 (H) 100 - 199 mg/dL Final          Failed - LDL in  normal range and within 360 days    LDL Cholesterol (Calc)  Date Value Ref Range Status  09/28/2017 125 (H) mg/dL (calc) Final    Comment:    Reference range: <100 . Desirable range <100 mg/dL for primary prevention;   <70 mg/dL for patients with CHD or diabetic patients  with > or = 2 CHD risk factors. Marland Kitchen LDL-C is now calculated using the Martin-Hopkins  calculation, which is a validated novel method providing  better accuracy than the Friedewald equation in the  estimation of LDL-C.  Cresenciano Genre et al. Annamaria Helling. 2297;989(21): 2061-2068  (http://education.[facility].com/faq/FAQ164)    LDL Chol Calc (NIH)  Date Value Ref Range Status  04/16/2021 129 (H) 0 - 99 mg/dL Final          Failed - Triglycerides in normal range and within 360 days    Triglycerides  Date Value Ref Range Status  04/16/2021 201 (H) 0 - 149 mg/dL Final          Passed - HDL in normal range and within 360 days    HDL  Date Value Ref Range Status  04/16/2021 52 >39 mg/dL Final          Passed - Patient is not pregnant      Passed - Valid encounter within last 12 months    Recent Outpatient Visits           3 months ago Primary hypertension   [place], Dionne Bucy, MD   9 months ago Encounter for annual physical exam   [facility] [place], Dionne Bucy, MD   1 year ago [place] [employer], Dionne Bucy, MD   1 year ago Encounter for annual physical exam   [facility] [place], Dionne Bucy, MD   2 years ago Facial swelling   [facility] [place], Dionne Bucy, MD       Future Appointments             In 3 months Ralene Bathe, MD [place]   In 3 months Bacigalupo, Dionne Bucy, MD  [facility]             potassium chloride SA (KLOR-CON M20) 20 MEQ tablet 90 tablet 0    Sig: Take 1 tablet (20 mEq total) by mouth daily.     Endocrinology:  Minerals - Potassium Supplementation Failed - 07/16/2021 11:40 AM      Failed - Cr in normal range and within 360 days    Creat  Date Value Ref Range Status  09/28/2017 1.12 (H) 0.60 - 0.93 mg/dL Final    Comment:    For patients >87 years of age, the reference limit for Creatinine is approximately 13% higher for people identified as African-American. .    Creatinine, Ser  Date Value Ref Range Status  04/16/2021 1.29 (H) 0.57 - 1.00 mg/dL Final          Passed - K in normal range and within 360 days    Potassium  Date Value Ref Range Status  04/16/2021 4.4 3.5 - 5.2 mmol/L Final  01/09/2014 4.3 3.5 - 5.1 mmol/L Final          Passed - Valid encounter within last 12 months    Recent Outpatient Visits           3 months ago Primary hypertension   [place], Dionne Bucy, MD   9 months ago Encounter  for annual physical exam   [facility] [place], Dionne Bucy, MD   1 year ago [place], Dionne Bucy, MD   1 year ago Encounter for annual physical exam   [facility] [place], Dionne Bucy, MD   2 years ago Facial swelling   [facility] [place], Dionne Bucy, MD       Future Appointments             In 3 months Ralene Bathe, MD [place]   In 3 months Bacigalupo, Dionne Bucy, MD [facility]             propranolol (INDERAL) 40 MG tablet 90 tablet 3    Sig: Take 0.5 tablets (20 mg total) by mouth 2 (two) times daily.     Cardiovascular:  Beta Blockers Passed - 07/16/2021 11:40 AM      Passed - Last BP in normal range    BP Readings from Last 1 Encounters:  04/16/21 135/79          Passed - Last Heart Rate in normal range    Pulse  Readings from Last 1 Encounters:  04/16/21 72          Passed - Valid encounter within last 6 months    Recent Outpatient Visits           3 months ago Primary hypertension   [facility] [place], Dionne Bucy, MD   9 months ago Encounter for annual physical exam   [facility] [place], Dionne Bucy, MD   1 year ago [place], Dionne Bucy, MD   1 year ago Encounter for annual physical exam   [facility] [place], Dionne Bucy, MD   2 years ago Facial swelling   [place], Dionne Bucy, MD       Future Appointments             In 3 months Ralene Bathe, MD [place]   In 3 months Bacigalupo, Dionne Bucy, MD [facility]            Signed Prescriptions Disp Refills   furosemide (LASIX) 40 MG tablet 90 tablet 0    Sig: Take 0.5 tablets (20 mg total) by mouth 2 (two) times daily.     Cardiovascular:  Diuretics - Loop Failed - 07/16/2021 11:40 AM      Failed - Cr in normal range and within 360 days    Creat  Date Value Ref Range Status  09/28/2017 1.12 (H) 0.60 - 0.93 mg/dL Final    Comment:    For patients >39 years of age, the reference limit for Creatinine is approximately 13% higher for people identified as African-American. .    Creatinine, Ser  Date Value Ref Range Status  04/16/2021 1.29 (H) 0.57 - 1.00 mg/dL Final          Passed - K in normal range and within 360 days    Potassium  Date Value Ref Range Status  04/16/2021 4.4 3.5 - 5.2 mmol/L Final  01/09/2014 4.3 3.5 - 5.1 mmol/L Final          Passed - Ca in normal range and within 360 days    Calcium  Date Value Ref Range Status  04/16/2021 9.9 8.7 - 10.3 mg/dL Final   Calcium, Total  Date Value Ref Range Status  05/13/2013 8.4 (  L) 8.5 - 10.1 mg/dL Final          Passed - Na in normal range and within 360 days    Sodium  Date Value Ref Range Status   04/16/2021 141 134 - 144 mmol/L Final  05/13/2013 142 136 - 145 mmol/L Final          Passed - Last BP in normal range    BP Readings from Last 1 Encounters:  04/16/21 135/79          Passed - Valid encounter within last 6 months    Recent Outpatient Visits           3 months ago Primary hypertension   [facility] [place], Dionne Bucy, MD   9 months ago Encounter for annual physical exam   [place], Dionne Bucy, MD   1 year ago [place], Dionne Bucy, MD   1 year ago Encounter for annual physical exam   [facility] [place], Dionne Bucy, MD   2 years ago Facial swelling   [facility] [place], Dionne Bucy, MD       Future Appointments             In 3 months Ralene Bathe, MD [place]   In 3 months Bacigalupo, Dionne Bucy, MD [facility], [place]

## 2021-07-16 NOTE — Telephone Encounter (Signed)
[facility] faxed refill request for the following medications:   pantoprazole (PROTONIX) 40 MG tablet   Please advise.

## 2021-07-16 NOTE — Telephone Encounter (Signed)
Requested Prescriptions  Pending Prescriptions Disp Refills  . rosuvastatin (CRESTOR) 5 MG tablet 90 tablet 3    Sig: TAKE 1 TABLET DAILY EXCEPT THURSDAY AND SUNDAY     Cardiovascular:  Antilipid - Statins Failed - 07/16/2021 11:40 AM      Failed - Total Cholesterol in normal range and within 360 days    Cholesterol, Total  Date Value Ref Range Status  04/16/2021 217 (H) 100 - 199 mg/dL Final         Failed - LDL in normal range and within 360 days    LDL Cholesterol (Calc)  Date Value Ref Range Status  09/28/2017 125 (H) mg/dL (calc) Final    Comment:    Reference range: <100 . Desirable range <100 mg/dL for primary prevention;   <70 mg/dL for patients with CHD or diabetic patients  with > or = 2 CHD risk factors. Marland Kitchen LDL-C is now calculated using the Martin-Hopkins  calculation, which is a validated novel method providing  better accuracy than the Friedewald equation in the  estimation of LDL-C.  Cresenciano Genre et al. Annamaria Helling. 0762;263(33): 2061-2068  (http://education.[facility].com/faq/FAQ164)    LDL Chol Calc (NIH)  Date Value Ref Range Status  04/16/2021 129 (H) 0 - 99 mg/dL Final         Failed - Triglycerides in normal range and within 360 days    Triglycerides  Date Value Ref Range Status  04/16/2021 201 (H) 0 - 149 mg/dL Final         Passed - HDL in normal range and within 360 days    HDL  Date Value Ref Range Status  04/16/2021 52 >39 mg/dL Final         Passed - Patient is not pregnant      Passed - Valid encounter within last 12 months    Recent Outpatient Visits          3 months ago Primary hypertension   [place], Dionne Bucy, MD   9 months ago Encounter for annual physical exam   [facility] [place], Dionne Bucy, MD   1 year ago [place], Dionne Bucy, MD   1 year ago Encounter for annual physical exam   [facility] [place], Dionne Bucy, MD    2 years ago Facial swelling   [facility] [place], Dionne Bucy, MD      Future Appointments            In 3 months Ralene Bathe, MD [place]   In 3 months Bacigalupo, Dionne Bucy, MD [facility], [place]           . potassium chloride SA (KLOR-CON M20) 20 MEQ tablet 90 tablet 0    Sig: Take 1 tablet (20 mEq total) by mouth daily.     Endocrinology:  Minerals - Potassium Supplementation Failed - 07/16/2021 11:40 AM      Failed - Cr in normal range and within 360 days    Creat  Date Value Ref Range Status  09/28/2017 1.12 (H) 0.60 - 0.93 mg/dL Final    Comment:    For patients >29 years of age, the reference limit for Creatinine is approximately 13% higher for people identified as African-American. .    Creatinine, Ser  Date Value Ref Range Status  04/16/2021 1.29 (H) 0.57 - 1.00 mg/dL Final         Passed - K in normal range  and within 360 days    Potassium  Date Value Ref Range Status  04/16/2021 4.4 3.5 - 5.2 mmol/L Final  01/09/2014 4.3 3.5 - 5.1 mmol/L Final         Passed - Valid encounter within last 12 months    Recent Outpatient Visits          3 months ago Primary hypertension   [facility] [place], Dionne Bucy, MD   9 months ago Encounter for annual physical exam   [facility] [place], Dionne Bucy, MD   1 year ago [place], Dionne Bucy, MD   1 year ago Encounter for annual physical exam   [facility] [place], Dionne Bucy, MD   2 years ago Facial swelling   [facility] [place], Dionne Bucy, MD      Future Appointments            In 3 months Ralene Bathe, MD [place]   In 3 months Bacigalupo, Dionne Bucy, MD [facility] [place]           . propranolol (INDERAL) 40 MG tablet 90 tablet 3    Sig: Take 0.5 tablets (20 mg total) by mouth 2 (two) times daily.     Cardiovascular:   Beta Blockers Passed - 07/16/2021 11:40 AM      Passed - Last BP in normal range    BP Readings from Last 1 Encounters:  04/16/21 135/79         Passed - Last Heart Rate in normal range    Pulse Readings from Last 1 Encounters:  04/16/21 72         Passed - Valid encounter within last 6 months    Recent Outpatient Visits          3 months ago Primary hypertension   [place] [facility] [place], Dionne Bucy, MD   9 months ago Encounter for annual physical exam   [facility] [place], Dionne Bucy, MD   1 year ago [place], Dionne Bucy, MD   1 year ago Encounter for annual physical exam   [facility] [place], Dionne Bucy, MD   2 years ago Facial swelling   [place], Dionne Bucy, MD      Future Appointments            In 3 months Ralene Bathe, MD [place]   In 3 months Bacigalupo, Dionne Bucy, MD [facility]           . furosemide (LASIX) 40 MG tablet 90 tablet 0    Sig: Take 0.5 tablets (20 mg total) by mouth 2 (two) times daily.     Cardiovascular:  Diuretics - Loop Failed - 07/16/2021 11:40 AM      Failed - Cr in normal range and within 360 days    Creat  Date Value Ref Range Status  09/28/2017 1.12 (H) 0.60 - 0.93 mg/dL Final    Comment:    For patients >78 years of age, the reference limit for Creatinine is approximately 13% higher for people identified as African-American. .    Creatinine, Ser  Date Value Ref Range Status  04/16/2021 1.29 (H) 0.57 - 1.00 mg/dL Final         Passed - K in normal range and within 360 days    Potassium  Date Value Ref Range Status  04/16/2021 4.4 3.5 - 5.2 mmol/L Final  01/09/2014 4.3 3.5 - 5.1 mmol/L Final         Passed - Ca in normal range and within 360 days    Calcium  Date Value Ref Range Status  04/16/2021 9.9 8.7 - 10.3 mg/dL Final   Calcium, Total  Date Value Ref Range  Status  05/13/2013 8.4 (L) 8.5 - 10.1 mg/dL Final         Passed - Na in normal range and within 360 days    Sodium  Date Value Ref Range Status  04/16/2021 141 134 - 144 mmol/L Final  05/13/2013 142 136 - 145 mmol/L Final         Passed - Last BP in normal range    BP Readings from Last 1 Encounters:  04/16/21 135/79         Passed - Valid encounter within last 6 months    Recent Outpatient Visits          3 months ago Primary hypertension   [facility] [place], Dionne Bucy, MD   9 months ago Encounter for annual physical exam   [facility] [place], Dionne Bucy, MD   1 year ago [place], Dionne Bucy, MD   1 year ago Encounter for annual physical exam   [facility] [place], Dionne Bucy, MD   2 years ago Facial swelling   [facility] [place], Dionne Bucy, MD      Future Appointments            In 3 months Ralene Bathe, MD [place]   In 3 months Bacigalupo, Dionne Bucy, MD [facility], [place]

## 2021-07-16 NOTE — Telephone Encounter (Signed)
Copied from [place] [id number]. Topic: Quick Communication - Rx Refill/Question >> Jul 16, 2021 10:34 AM Tessa Lerner A wrote: Medication: rosuvastatin (CRESTOR) 5 MG tablet [[id number]]   pantoprazole (PROTONIX) 40 MG tablet [[id number]]   furosemide (LASIX) 40 MG tablet [[id number]]   propranolol (INDERAL) 40 MG tablet [[id number]]   KLOR-CON M20 20 MEQ tablet [[id number]]  DISCONTINUED  Has the patient contacted their pharmacy? Yes.  - This will be patient's first time using this pharmacy and the pharmacy has been unable to reach the practice successfully. The pharmacy directed the patient to contact their PCP.   Preferred Pharmacy (with phone number or street name): [facility] ([facility]) - [place], [place]  Phone:  [phone] Fax:  [phone]  Has the patient been seen for an appointment in the last year OR does the patient have an upcoming appointment? Yes.    Agent: Please be advised that RX refills may take up to 3 business days. We ask that you follow-up with your pharmacy.

## 2021-07-17 MED ORDER — POTASSIUM CHLORIDE CRYS ER 20 MEQ PO TBCR: 20.0000 meq | EXTENDED_RELEASE_TABLET | Freq: Every day | ORAL | 1 refills | Status: DC

## 2021-07-17 MED ORDER — PROPRANOLOL HCL 40 MG PO TABS: 20.0000 mg | ORAL_TABLET | Freq: Two times a day (BID) | ORAL | 1 refills | Status: DC

## 2021-07-17 MED ORDER — ROSUVASTATIN CALCIUM 5 MG PO TABS
ORAL_TABLET | ORAL | 1 refills | Status: DC
Start: 2021-07-17 — End: 2022-05-14

## 2021-10-06 ENCOUNTER — Other Ambulatory Visit: Payer: Self-pay | Admitting: Family Medicine

## 2021-10-21 LAB — HM DIABETES EYE EXAM

## 2021-10-28 ENCOUNTER — Ambulatory Visit (INDEPENDENT_AMBULATORY_CARE_PROVIDER_SITE_OTHER): Payer: Medicare Other | Admitting: Dermatology

## 2021-10-28 ENCOUNTER — Other Ambulatory Visit: Payer: Self-pay

## 2021-10-28 DIAGNOSIS — L820 Inflamed seborrheic keratosis: Secondary | ICD-10-CM | POA: Diagnosis not present

## 2021-10-28 DIAGNOSIS — Z1283 Encounter for screening for malignant neoplasm of skin: Secondary | ICD-10-CM | POA: Diagnosis not present

## 2021-10-28 DIAGNOSIS — L570 Actinic keratosis: Secondary | ICD-10-CM

## 2021-10-28 DIAGNOSIS — L578 Other skin changes due to chronic exposure to nonionizing radiation: Secondary | ICD-10-CM | POA: Diagnosis not present

## 2021-10-28 DIAGNOSIS — D23122 Other benign neoplasm of skin of left lower eyelid, including canthus: Secondary | ICD-10-CM

## 2021-10-28 DIAGNOSIS — D23112 Other benign neoplasm of skin of right lower eyelid, including canthus: Secondary | ICD-10-CM | POA: Diagnosis not present

## 2021-10-28 DIAGNOSIS — D229 Melanocytic nevi, unspecified: Secondary | ICD-10-CM

## 2021-10-28 DIAGNOSIS — L821 Other seborrheic keratosis: Secondary | ICD-10-CM

## 2021-10-28 DIAGNOSIS — D1800 Hemangioma unspecified site: Secondary | ICD-10-CM

## 2021-10-28 DIAGNOSIS — L814 Other melanin hyperpigmentation: Secondary | ICD-10-CM

## 2021-10-28 DIAGNOSIS — D239 Other benign neoplasm of skin, unspecified: Secondary | ICD-10-CM

## 2021-10-28 NOTE — Progress Notes (Signed)
New Patient Visit  Subjective  Sheila Kim is a 81 y.o. female who presents for the following: Skin Problem and Annual Exam. The patient presents for Total-Body Skin Exam (TBSE) for skin cancer screening and mole check.  The patient has spots, moles and lesions to be evaluated, some may be new or changing and the patient has concerns that these could be cancer.   The following portions of the chart were reviewed this encounter and updated as appropriate:   Tobacco   Allergies   Meds   Problems   Med Hx   Surg Hx   Fam Hx      Review of Systems:  No other skin or systemic complaints except as noted in HPI or Assessment and Plan.  Objective  Well appearing patient in no apparent distress; mood and affect are within normal limits.  A full examination was performed including scalp, head, eyes, ears, nose, lips, neck, chest, axillae, abdomen, back, buttocks, bilateral upper extremities, bilateral lower extremities, hands, feet, fingers, toes, fingernails, and toenails. All findings within normal limits unless otherwise noted below.  left lower eyelid Yellowish papule  right cheek x 2, left forearm x 2  (4) (4) Stuck-on, waxy, tan-brown papules   left nasal bridge x 1, left nose x 1  (2) (2) Erythematous thin papules/macules with gritty scale.    Assessment & Plan  Syringomas lower eyelids Benign-appearing.  Observation.  Call clinic for new or changing moles.  Recommend daily use of broad spectrum spf 30+ sunscreen to sun-exposed areas.    Inflamed seborrheic keratosis (4) right cheek x 2, left forearm x 2  (4) Reassured benign age-related growth.  Recommend observation.  Discussed cryotherapy if spot(s) become irritated or inflamed.   Prior to procedure, discussed risks of blister formation, small wound, skin dyspigmentation, or rare scar following cryotherapy. Recommend Vaseline ointment to treated areas while healing.   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 They are chronic with expected duration over 1 year. A portion of actinic keratoses will progress to squamous cell carcinoma of the skin. It is not possible to reliably predict which spots will progress to skin cancer and so treatment is recommended to prevent development of skin cancer.  Recommend daily broad spectrum sunscreen SPF 30+ to sun-exposed areas, reapply every 2 hours as needed.  Recommend staying in the shade or wearing long sleeves, sun glasses (UVA+UVB protection) and wide brim hats (4-inch brim around the entire circumference of the hat). Call for new or changing lesions.   Destruction of lesion - left nasal bridge x 1, left nose x 1  (2) Complexity: simple   Destruction method: cryotherapy   Informed consent: discussed and consent obtained   Timeout:  patient name, date of birth, surgical site, and procedure verified Lesion destroyed using liquid nitrogen: Yes   Region frozen until ice ball extended beyond lesion:  Yes   Outcome: patient tolerated procedure well with no complications   Post-procedure details: wound care instructions given    Skin cancer screening  Lentigines - Scattered tan macules - Due to sun exposure - Benign-appearing, observe - Recommend daily broad spectrum sunscreen SPF 30+ to sun-exposed areas, reapply every 2 hours as needed. - Call  for any changes  Seborrheic Keratoses - Stuck-on, waxy, tan-brown papules and/or plaques  - Benign-appearing - Discussed benign etiology and prognosis. - Observe - Call for any changes  Melanocytic Nevi - Tan-brown and/or pink-flesh-colored symmetric macules and papules - Benign appearing on exam today - Observation - Call clinic for new or changing moles - Recommend daily use of broad spectrum spf 30+ sunscreen to sun-exposed areas.   Hemangiomas - Red papules - Discussed benign nature - Observe - Call for any changes  Actinic Damage - Chronic condition, secondary to cumulative UV/sun exposure - diffuse scaly erythematous macules with underlying dyspigmentation - Recommend daily broad spectrum sunscreen SPF 30+ to sun-exposed areas, reapply every 2 hours as needed.  - Staying in the shade or wearing long sleeves, sun glasses (UVA+UVB protection) and wide brim hats (4-inch brim around the entire circumference of the hat) are also recommended for sun protection.  - Call for new or changing lesions.  Skin cancer screening performed today.   Return in about 6 months (around 04/27/2022) for AKs-nose .  IMarye Round, CMA, am acting as scribe for Sarina Ser, MD .  Documentation: I have reviewed the above documentation for accuracy and completeness, and I agree with the above.  Sarina Ser, MD

## 2021-10-28 NOTE — Patient Instructions (Addendum)

## 2021-10-29 ENCOUNTER — Other Ambulatory Visit: Payer: Self-pay | Admitting: Family Medicine

## 2021-10-29 ENCOUNTER — Encounter: Payer: Self-pay | Admitting: Dermatology

## 2021-10-29 NOTE — Telephone Encounter (Signed)
Requested Prescriptions  Pending Prescriptions Disp Refills   furosemide (LASIX) 40 MG tablet [Pharmacy Med Name: Furosemide 40 MG Oral Tablet] 90 tablet 3    Sig: TAKE ONE-HALF TABLET BY  MOUTH TWICE DAILY     Cardiovascular:  Diuretics - Loop Failed - 10/29/2021  5:25 AM      Failed - Cr in normal range and within 360 days    Creat  Date Value Ref Range Status  09/28/2017 1.12 (H) 0.60 - 0.93 mg/dL Final    Comment:    For patients >83 years of age, the reference limit for Creatinine is approximately 13% higher for people identified as African-American. .    Creatinine, Ser  Date Value Ref Range Status  04/16/2021 1.29 (H) 0.57 - 1.00 mg/dL Final         Failed - Valid encounter within last 6 months    Recent Outpatient Visits          6 months ago Primary hypertension   [facility] [place], Dionne Bucy, MD   1 year ago Encounter for annual physical exam   [facility] [place], Dionne Bucy, MD   1 year ago [place], Dionne Bucy, MD   2 years ago Encounter for annual physical exam   [facility] [place], Dionne Bucy, MD   2 years ago Facial swelling   [place], Dionne Bucy, MD      Future Appointments            In 2 weeks [place], Dionne Bucy, MD [facility] [place]   In 6 months Ralene Bathe, MD [place] in normal range and within 360 days    Potassium  Date Value Ref Range Status  04/16/2021 4.4 3.5 - 5.2 mmol/L Final  01/09/2014 4.3 3.5 - 5.1 mmol/L Final         Passed - Ca in normal range and within 360 days    Calcium  Date Value Ref Range Status  04/16/2021 9.9 8.7 - 10.3 mg/dL Final   Calcium, Total  Date Value Ref Range Status  05/13/2013 8.4 (L) 8.5 - 10.1 mg/dL Final         Passed - Na in normal range and within 360 days    Sodium  Date Value Ref Range Status  04/16/2021 141  134 - 144 mmol/L Final  05/13/2013 142 136 - 145 mmol/L Final         Passed - Last BP in normal range    BP Readings from Last 1 Encounters:  04/16/21 135/79

## 2021-11-03 ENCOUNTER — Ambulatory Visit: Payer: Self-pay | Admitting: Family Medicine

## 2021-11-11 NOTE — Progress Notes (Signed)
Annual Wellness Visit     Patient: Sheila Kim, Female    DOB: 01-28-1941, 81 y.o.   MRN: [id number] Visit Date: 11/12/2021  Today's Provider: Lavon Paganini, MD    I,Joseline E Rosas,acting as a scribe for Lavon Paganini, MD.,have documented all relevant documentation on the behalf of Lavon Paganini, MD,as directed by  Lavon Paganini, MD while in the presence of Lavon Paganini, MD.   Chief Complaint  Patient presents with   Annual [place]   Subjective    Sheila Kim is a 81 y.o. female who presents today for her Annual Wellness Visit. She reports consuming a  vegetarian, fish  diet. Home exercise routine includes stretching and treadmill. She does weight lifting 3 times a week. She generally feels well. She reports sleeping fairly well. She does not have additional problems to discuss today.   HPI Mammogram: 04/14/21-NL    Medications: Outpatient Medications Prior to Visit  Medication Sig   acetaminophen (TYLENOL) 500 MG tablet Take 500 mg by mouth. In the PM and as needed   acetaminophen (TYLENOL) 650 MG CR tablet Take 650 mg by mouth. 2 tablets in the AM and 1 tablet in the PM   Alpha-D-Galactosidase (BEANO TO GO) TABS Take by mouth daily at 6 (six) AM. occasionally   aspirin 81 MG tablet Take 81 mg by mouth daily.    CALCIUM CITRATE PO Take 315 mg by mouth 2 (two) times daily.    cholecalciferol (VITAMIN D) 1000 UNITS tablet Take 1,000 Units by mouth daily.    Coenzyme Q10 (COQ10) 100 MG CAPS Take 200 mg by mouth daily.    cyanocobalamin 1000 MCG tablet Take 1,000 mcg by mouth daily.    famotidine (PEPCID) 20 MG tablet Take 20 mg by mouth 2 (two) times daily.   ferrous sulfate 325 (65 FE) MG tablet Take 325 mg by mouth daily with breakfast.    fluticasone (FLONASE) 50 MCG/ACT nasal spray Place 2 sprays into both nostrils daily.   folic acid (FOLVITE) 1 MG tablet Take 1 mg by mouth daily.    ketotifen (ZADITOR) 0.025 % ophthalmic  solution Place 1 drop into both eyes as needed.   loratadine (CLARITIN) 10 MG tablet Take 10 mg by mouth daily.    MELATONIN EXTRA STRENGTH PO Take 0.25 tablets by mouth at bedtime as needed.    Multiple Vitamins-Minerals ([employer] SPECTRAVITE ADULT 50+ PO) Education officer, community - Historical Medication  daily Active   pantoprazole (PROTONIX) 40 MG tablet Take 1 tablet (40 mg total) by mouth 2 (two) times daily.   Polyethyl Glycol-Propyl Glycol 0.4-0.3 % SOLN Apply to eye as needed.   polyethylene glycol (MIRALAX / GLYCOLAX) packet Take 17 g by mouth daily.   potassium chloride SA (KLOR-CON M) 20 MEQ tablet Take 1 tablet (20 mEq total) by mouth daily.   Probiotic Product ([employer] SENIOR PROBIOTIC) CAPS Take by mouth daily.    propranolol (INDERAL) 40 MG tablet Take 0.5 tablets (20 mg total) by mouth 2 (two) times daily.   Psyllium Husk POWD Take by mouth daily.    pyridoxine (B-6) 100 MG tablet Take 100 mg by mouth daily.    rosuvastatin (CRESTOR) 5 MG tablet TAKE 1 TABLET DAILY EXCEPT THURSDAY AND SUNDAY   [DISCONTINUED] furosemide (LASIX) 40 MG tablet TAKE ONE-HALF TABLET BY  MOUTH TWICE DAILY   No facility-administered medications prior to visit.    Allergies  Allergen Reactions   Alcohol  Gastric pain   Gelatin Capsule Empty  [Gelatin]     Stomach pains   Gemfibrozil     Stomach pains, urine retention and muscle weakness.   Meloxicam     Drastically alters taste   Nsaids Other (See Comments)    Tolerates aspirin stomach pains and reflux   Oxycodone Itching    Trembling   Simvastatin Other (See Comments)    Feet and leg pain and general body weakness.   Sulfa Antibiotics Nausea And Vomiting and Other (See Comments)   Clarithromycin Rash and Nausea And Vomiting    Terrible taste in mouth and stomach ache.   Lopressor  [Metoprolol] Palpitations   Niacin Other (See Comments) and Rash    Flushing   Ofloxacin Rash and Nausea Only    Patient Care Team: Virginia Crews, MD as PCP - General (Family Medicine) Dingeldein, Remo Lipps, MD as Consulting Physician (Ophthalmology) Isaias Cowman, MD as Consulting Physician (Cardiology) Abbie Sons, MD as Consulting Physician (Urology) Doyle Askew, MD as Referring Physician (Physical Medicine and Rehabilitation)  Review of Systems  HENT:  Positive for postnasal drip and sinus pressure.   Eyes:  Positive for itching.  Genitourinary:  Positive for enuresis.  Musculoskeletal:  Positive for arthralgias and myalgias.  All other systems reviewed and are negative.      Objective    Vitals: BP 128/70 (BP Location: Left Arm, Patient Position: Sitting, Cuff Size: Normal)    Pulse 65    Temp 97.8 F (36.6 C) (Oral)    Resp 16    Ht 4\' 8"  (1.422 m)    Wt 122 lb 11.2 oz (55.7 kg)    BMI 27.51 kg/m    Physical Exam Vitals reviewed.  Constitutional:      General: She is not in acute distress.    Appearance: Normal appearance. She is well-developed. She is not diaphoretic.  HENT:     Head: Normocephalic and atraumatic.     Right Ear: Tympanic membrane, ear canal and external ear normal.     Left Ear: Tympanic membrane, ear canal and external ear normal.     Nose: Nose normal.     Mouth/Throat:     Mouth: Mucous membranes are moist.     Pharynx: Oropharynx is clear. No oropharyngeal exudate.  Eyes:     General: No scleral icterus.    Conjunctiva/sclera: Conjunctivae normal.     Pupils: Pupils are equal, round, and reactive to light.  Neck:     Thyroid: No thyromegaly.  Cardiovascular:     Rate and Rhythm: Normal rate and regular rhythm.     Pulses: Normal pulses.     Heart sounds: Normal heart sounds. No murmur heard. Pulmonary:     Effort: Pulmonary effort is normal. No respiratory distress.     Breath sounds: Normal breath sounds. No wheezing or rales.  Abdominal:     General: There is no distension.     Palpations: Abdomen is soft.     Tenderness: There is no abdominal  tenderness.  Musculoskeletal:        General: No deformity.     Cervical back: Neck supple.     Right lower leg: No edema.     Left lower leg: No edema.  Lymphadenopathy:     Cervical: No cervical adenopathy.  Skin:    General: Skin is warm and dry.     Findings: No rash.  Neurological:     Mental Status: She is alert and  oriented to person, place, and time. Mental status is at baseline.     Gait: Gait normal.  Psychiatric:        Mood and Affect: Mood normal.        Behavior: Behavior normal.        Thought Content: Thought content normal.     Most recent functional status assessment: In your present state of health, do you have any difficulty performing the following activities: 11/12/2021  Hearing? N  Vision? N  Difficulty concentrating or making decisions? N  Walking or climbing stairs? N  Dressing or bathing? N  Doing errands, shopping? N  Some recent data might be hidden   Most recent fall risk assessment: Fall Risk  11/12/2021  Falls in the past year? 1  Number falls in past yr: 0  Injury with Fall? 0    Most recent depression screenings: PHQ 2/9 Scores 11/12/2021 10/16/2020  PHQ - 2 Score 0 0  PHQ- 9 Score - 1   Most recent cognitive screening: 6CIT Screen 11/12/2021  What Year? 0 points  What month? 0 points  What time? 0 points  Count back from 20 0 points  Months in reverse 0 points  Repeat phrase 2 points  Total Score 2   Most recent Audit-C alcohol use screening Alcohol Use Disorder Test (AUDIT) 10/16/2020  1. How often do you have a drink containing alcohol? 0  2. How many drinks containing alcohol do you have on a typical day when you are drinking? 0  3. How often do you have six or more drinks on one occasion? 0  AUDIT-C Score 0  Alcohol Brief Interventions/Follow-up AUDIT Score <7 follow-up not indicated   A score of 3 or more in women, and 4 or more in men indicates increased risk for alcohol abuse, EXCEPT if all of the points are from question 1    Results for orders placed or performed in visit on 11/12/21  HM DIABETES EYE EXAM  Result Value Ref Range   HM Diabetic Eye Exam No Retinopathy No Retinopathy    Assessment & Plan     Annual wellness visit done today including the all of the following: Reviewed patient's Family Medical History Reviewed and updated list of patient's medical providers Assessment of cognitive impairment was done Assessed patient's functional ability Established a written schedule for health screening [place] Completed and Reviewed  Exercise Activities and Dietary recommendations  Goals      DIET - REDUCE SUGAR INTAKE     Recommend cutting back and monitoring of dessert consumed in daily diet.        Immunization History  Administered Date(s) Administered   Fluad Quad(high Dose 65+) 07/24/2019, 07/24/2020, 07/15/2021   Influenza, High Dose Seasonal PF 07/26/2015, 08/06/2016, 07/21/2017, 08/03/2018   PFIZER Comirnaty(Gray Top)Covid-19 Tri-Sucrose Vaccine 01/14/2021   PFIZER(Purple Top)SARS-COV-2 Vaccination 11/14/2019, 12/05/2019, 07/08/2020   Pfizer Covid-19 Vaccine Bivalent Booster 97yrs & up 06/22/2021   Pneumococcal Conjugate-13 07/16/2014   Pneumococcal Polysaccharide-23 03/16/1999, 08/21/2005   Tdap 07/01/2011   Zoster Recombinat (Shingrix) 09/25/2018, 11/27/2018   Zoster, Live 10/01/2011    Health Maintenance  Topic Date Due   TETANUS/TDAP  06/30/2021   DEXA SCAN  09/20/2028 (Originally 04/10/2011)   Pneumonia Vaccine 18+ Years old  Completed   INFLUENZA VACCINE  Completed   COVID-19 Vaccine  Completed   Zoster Vaccines- Shingrix  Completed   HPV VACCINES  Aged Out     Discussed health benefits of physical activity,  and encouraged her to engage in regular exercise appropriate for her age and condition.    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 After shared decision making with patient, agree not recheck DEXA Continue Ca/Vit D, regular weightbearing exercise        Genitourinary   Chronic kidney disease (CKD), stage II (mild)    Chronic and stable Recheck metabolic panel Avoid nephrotoxic meds       Relevant Orders   Comprehensive metabolic panel     Other   Hypercholesteremia    Previously well controlled Continue statin Repeat FLP and CMP      Relevant Medications   furosemide (LASIX) 40 MG tablet   Other Relevant Orders   Lipid panel   Prediabetes    Recommend low carb diet Recheck A1c       Relevant Orders   Hemoglobin A1c   Other Visit Diagnoses     Encounter for annual wellness visit (AWV) in Medicare patient    -  Primary   Encounter for annual physical exam       Relevant Orders   Comprehensive metabolic panel   Lipid panel   Hemoglobin A1c        Return in about 6 months (around 05/12/2022) for chronic disease f/u.     I, Lavon Paganini, MD, have reviewed all documentation for this visit. The documentation on 11/12/21 for the exam, diagnosis, procedures, and orders are all accurate and complete.   Nikhil Osei, Dionne Bucy, MD, MPH [facility]

## 2021-11-12 ENCOUNTER — Other Ambulatory Visit: Payer: Self-pay

## 2021-11-12 ENCOUNTER — Encounter: Payer: Self-pay | Admitting: Family Medicine

## 2021-11-12 ENCOUNTER — Ambulatory Visit (INDEPENDENT_AMBULATORY_CARE_PROVIDER_SITE_OTHER): Payer: Medicare Other | Admitting: Family Medicine

## 2021-11-12 VITALS — BP 128/70 | HR 65 | Temp 97.8°F | Resp 16 | Ht <= 58 in | Wt 122.7 lb

## 2021-11-12 DIAGNOSIS — E7800 Pure hypercholesterolemia, unspecified: Secondary | ICD-10-CM | POA: Diagnosis not present

## 2021-11-12 DIAGNOSIS — M810 Age-related osteoporosis without current pathological fracture: Secondary | ICD-10-CM | POA: Diagnosis not present

## 2021-11-12 DIAGNOSIS — R7303 Prediabetes: Secondary | ICD-10-CM

## 2021-11-12 DIAGNOSIS — N182 Chronic kidney disease, stage 2 (mild): Secondary | ICD-10-CM | POA: Diagnosis not present

## 2021-11-12 DIAGNOSIS — Z0000 Encounter for general adult medical examination without abnormal findings: Secondary | ICD-10-CM

## 2021-11-12 DIAGNOSIS — I10 Essential (primary) hypertension: Secondary | ICD-10-CM | POA: Diagnosis not present

## 2021-11-12 DIAGNOSIS — I130 Hypertensive heart and chronic kidney disease with heart failure and stage 1 through stage 4 chronic kidney disease, or unspecified chronic kidney disease: Secondary | ICD-10-CM

## 2021-11-12 MED ORDER — FUROSEMIDE 40 MG PO TABS: 20.0000 mg | ORAL_TABLET | Freq: Two times a day (BID) | ORAL | 1 refills | Status: DC

## 2021-11-12 NOTE — Assessment & Plan Note (Signed)
Well controlled Continue current medications Recheck metabolic panel F/u in 6 months  

## 2021-11-12 NOTE — Assessment & Plan Note (Signed)
Risk factor control as above F/b Cardiology

## 2021-11-12 NOTE — Assessment & Plan Note (Signed)
Chronic and stable Recheck metabolic panel Avoid nephrotoxic meds  

## 2021-11-12 NOTE — Patient Instructions (Addendum)
Ask at the pharmacy about a tetanus shot (TDAP)

## 2021-11-12 NOTE — Assessment & Plan Note (Signed)
Recommend low carb diet °Recheck A1c  °

## 2021-11-12 NOTE — Assessment & Plan Note (Signed)
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

## 2021-11-12 NOTE — Assessment & Plan Note (Signed)
Previously well controlled Continue statin Repeat FLP and CMP  

## 2021-11-13 LAB — COMPREHENSIVE METABOLIC PANEL
ALT: 16 IU/L (ref 0–32)
AST: 24 IU/L (ref 0–40)
Albumin/Globulin Ratio: 2 (ref 1.2–2.2)
Albumin: 4.7 g/dL — ABNORMAL HIGH (ref 3.6–4.6)
Alkaline Phosphatase: 52 IU/L (ref 44–121)
BUN/Creatinine Ratio: 16 (ref 12–28)
BUN: 20 mg/dL (ref 8–27)
Bilirubin Total: 0.8 mg/dL (ref 0.0–1.2)
CO2: 28 mmol/L (ref 20–29)
Calcium: 10 mg/dL (ref 8.7–10.3)
Chloride: 98 mmol/L (ref 96–106)
Creatinine, Ser: 1.22 mg/dL — ABNORMAL HIGH (ref 0.57–1.00)
Globulin, Total: 2.4 g/dL (ref 1.5–4.5)
Glucose: 121 mg/dL — ABNORMAL HIGH (ref 70–99)
Potassium: 4.6 mmol/L (ref 3.5–5.2)
Sodium: 138 mmol/L (ref 134–144)
Total Protein: 7.1 g/dL (ref 6.0–8.5)
eGFR: 44 mL/min/{1.73_m2} — ABNORMAL LOW (ref >59)

## 2021-11-13 LAB — LIPID PANEL
Chol/HDL Ratio: 4.5 ratio — ABNORMAL HIGH (ref 0.0–4.4)
Cholesterol, Total: 219 mg/dL — ABNORMAL HIGH (ref 100–199)
HDL: 49 mg/dL (ref >39)
LDL Chol Calc (NIH): 129 mg/dL — ABNORMAL HIGH (ref 0–99)
Triglycerides: 234 mg/dL — ABNORMAL HIGH (ref 0–149)
VLDL Cholesterol Cal: 41 mg/dL — ABNORMAL HIGH (ref 5–40)

## 2021-11-13 LAB — HEMOGLOBIN A1C
Est. average glucose Bld gHb Est-mCnc: 128 mg/dL
Hgb A1c MFr Bld: 6.1 % — ABNORMAL HIGH (ref 4.8–5.6)

## 2021-11-18 IMAGING — MR MR LUMBAR SPINE WO/W CM
5 of 7 series · 34 of 48 positions shown · IV contrast (gadavist)
Comparison: MRI lumbar spine dated September 20, 2007.

CLINICAL DATA: Chronic left buttock and posterior thigh pain from
most a year. Prior fusion in 3116.

EXAM:
MRI LUMBAR SPINE WITHOUT AND WITH CONTRAST
TECHNIQUE: Multiplanar and multiecho pulse sequences of the lumbar spine were
obtained without and with intravenous contrast.
CONTRAST:  5mL GADAVIST GADOBUTROL 1 MMOL/ML IV SOLN

[Series 5: T2 · sagittal · 4.0mm · 0.81mm/px · 6 of 17 slices shown (1 of 2)]
[im 1/17]
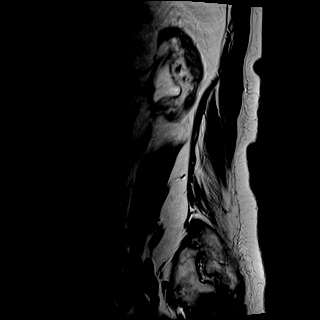
[im 4/17]
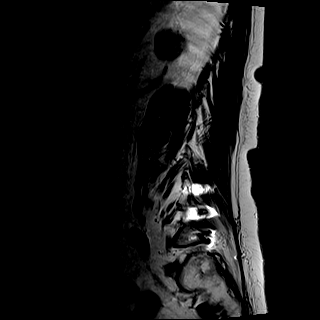
[im 7/17]
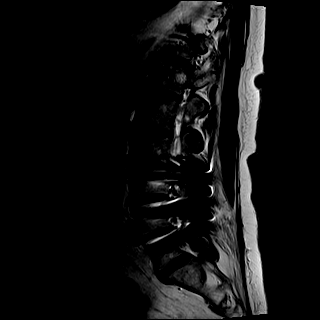
[im 10/17]
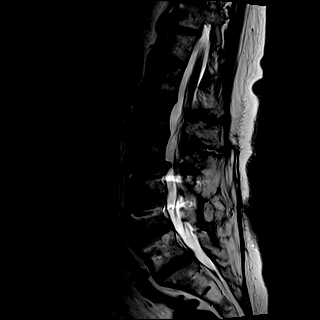
[im 13/17]
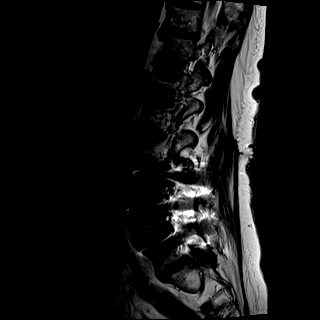
[im 17/17]
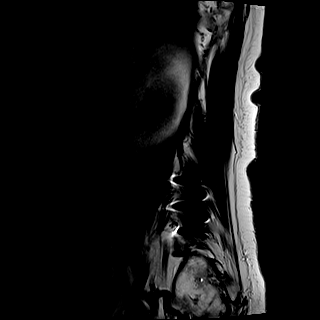

[Series 6: T1 · sagittal · 4.0mm · 0.81mm/px · 5 of 17 slices shown (1 of 2)]
[im 1/17]
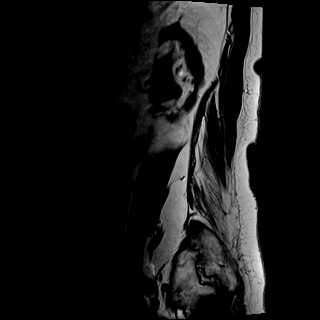
[im 5/17]
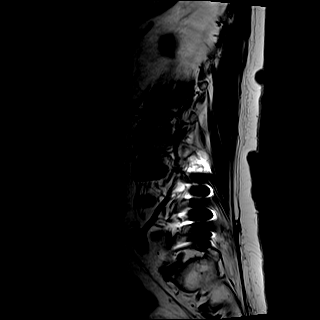
[im 9/17]
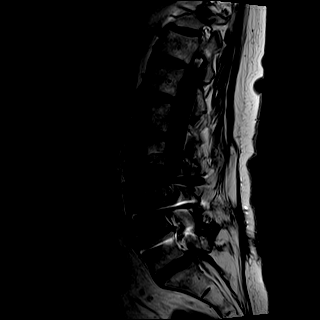
[im 13/17]
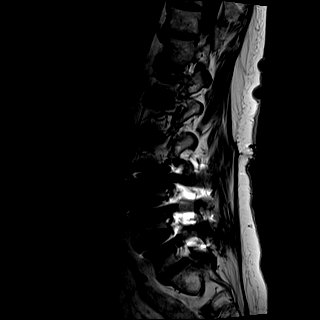
[im 17/17]
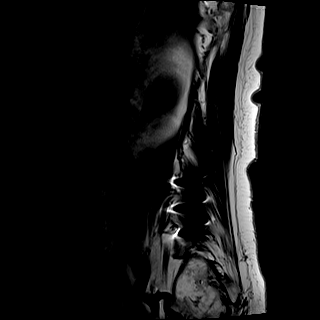

[Series 8: T2 · axial · 4.0mm · 0.78mm/px · z∈[-14,+175]mm · 9 of 28 slices shown (2 of 2)]
[im 1/28]
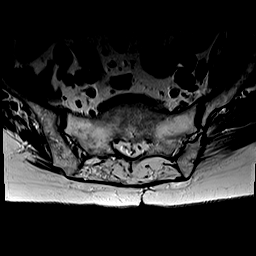
[im 4/28]
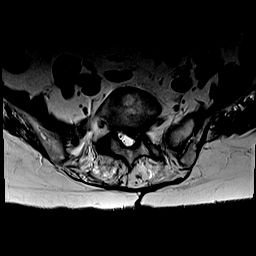
[im 7/28]
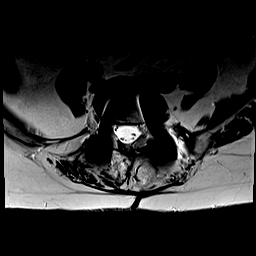
[im 11/28]
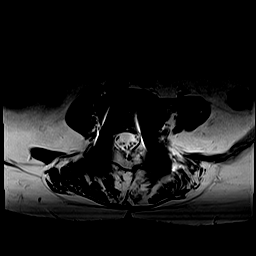
[im 14/28]
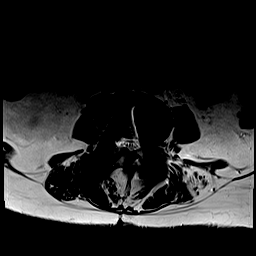
[im 17/28]
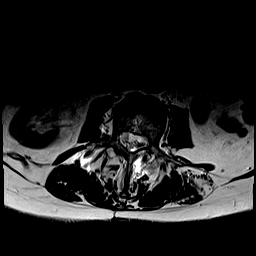
[im 21/28]
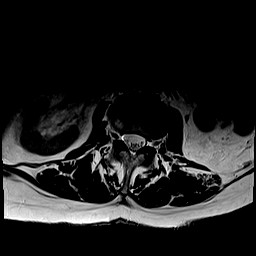
[im 24/28]
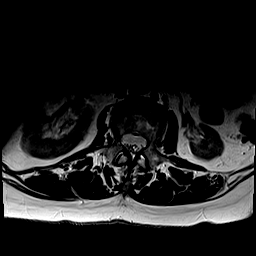
[im 28/28]
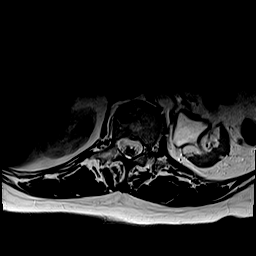

[Series 9: T1 · axial · 4.0mm · 0.39mm/px · z∈[-14,+175]mm · 9 of 28 slices shown (2 of 2)]
[im 1/28]
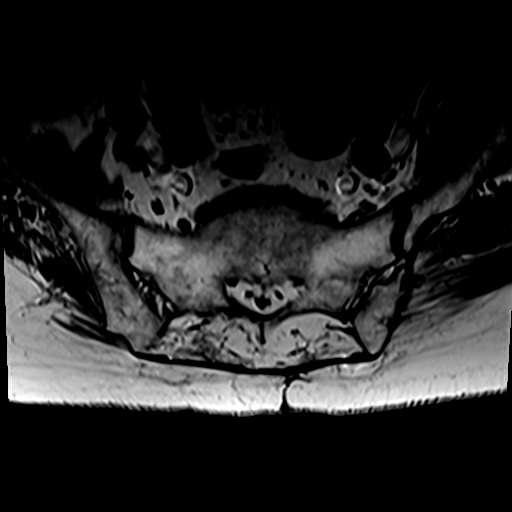
[im 4/28]
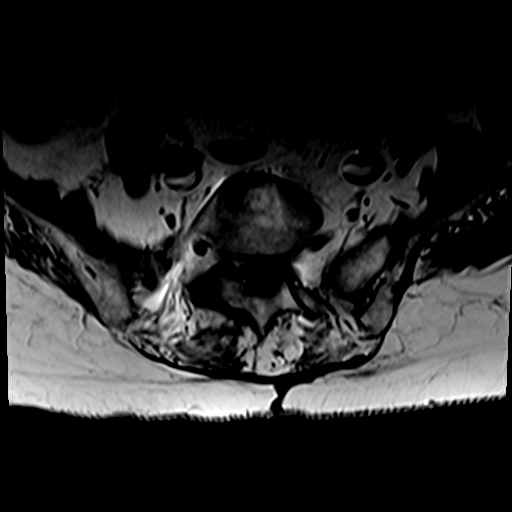
[im 7/28]
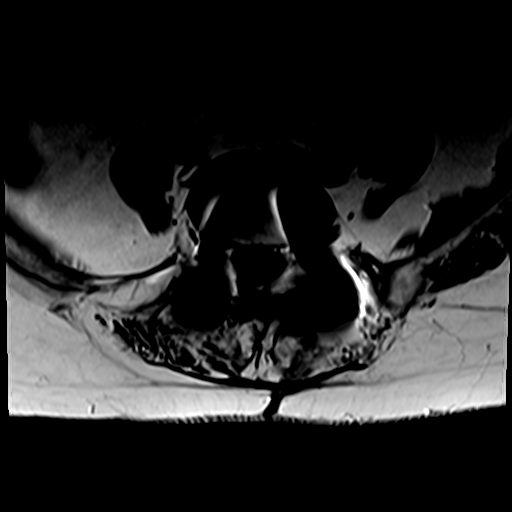
[im 11/28]
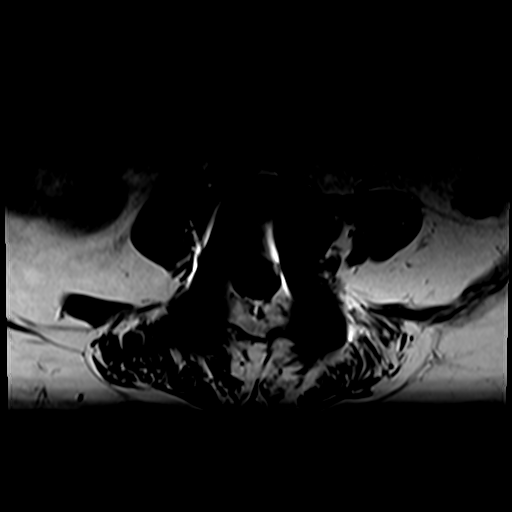
[im 14/28]
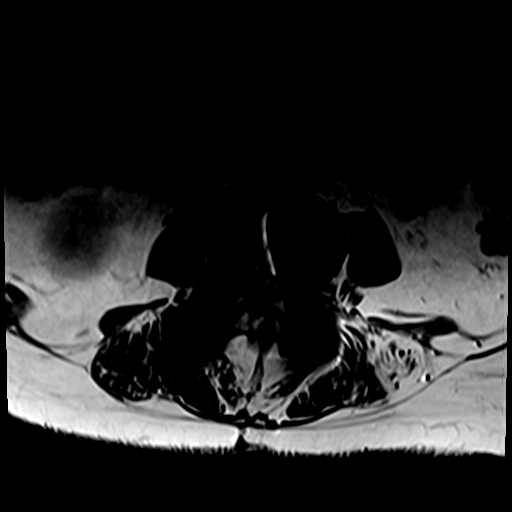
[im 17/28]
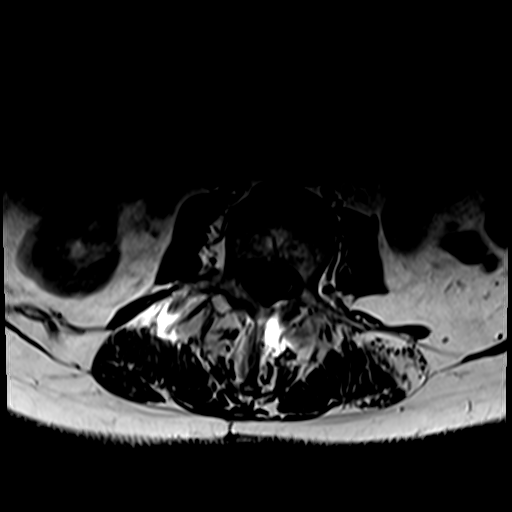
[im 21/28]
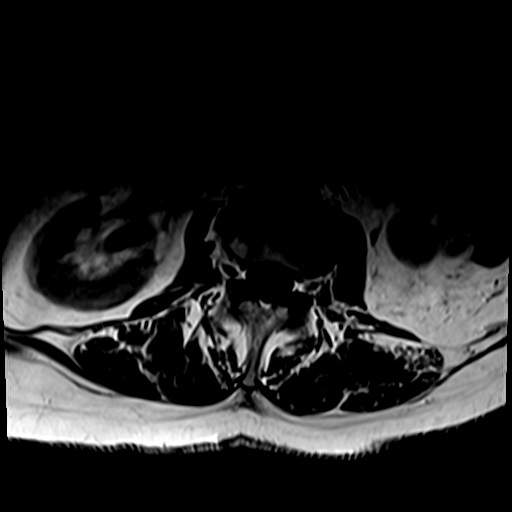
[im 24/28]
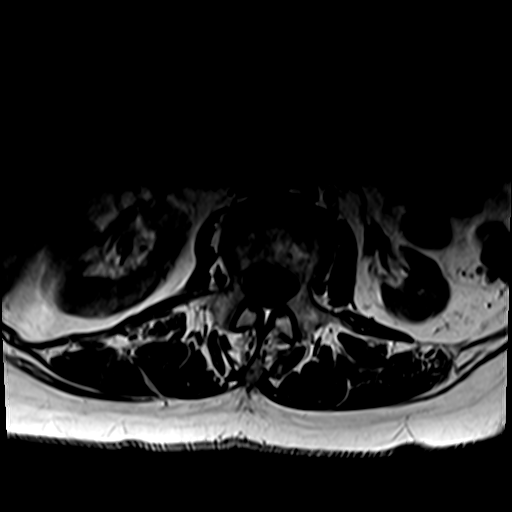
[im 28/28]
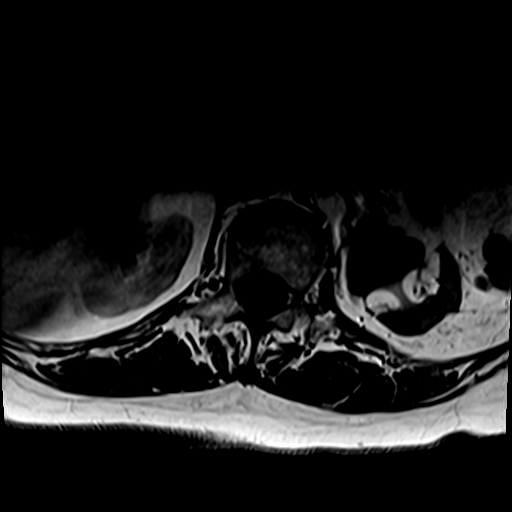

[Series 10: T1 fat-sat post-contrast · sagittal · 4.0mm · 0.81mm/px · 5 of 17 slices shown]
[im 1/17]
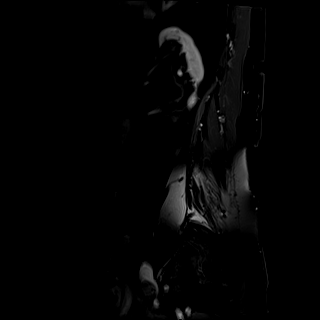
[im 5/17]
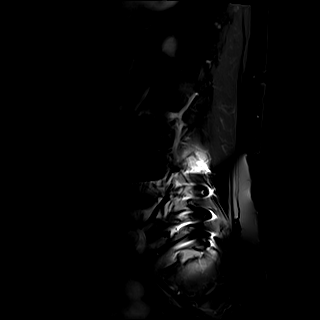
[im 9/17]
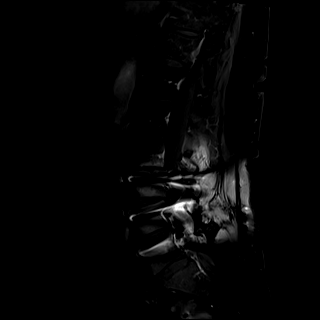
[im 13/17]
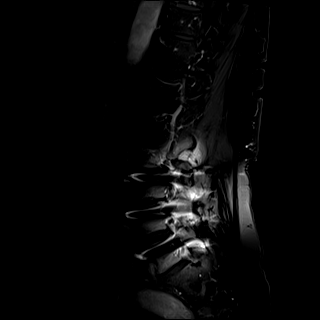
[im 17/17]
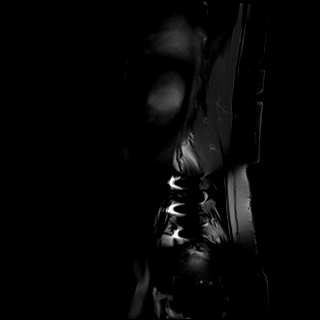

[34 of 48 positions shown; findings below may reference images not displayed]

FINDINGS: Segmentation:  Standard.

Alignment: Slightly progressed mild levoscoliosis. New trace
anterolisthesis at L2-L3.

Vertebrae: Prior L3-L5 posterior fusion. No fracture, evidence of
discitis, or bone lesion.

Conus medullaris and cauda equina: Conus extends to the L1 level.
Conus and cauda equina appear normal. No intradural enhancement.

Paraspinal and other soft tissues: Interval atrophy of the left
kidney. Colonic diverticulosis.

Disc levels:

T10-T11: Mild disc bulging. No stenosis.

T11-T12: Negative.

T12-L1:  Mild disc bulging. No stenosis.

L1-L2:  Mild disc bulging. No stenosis.

L2-L3: Progressive now severe disc height loss with moderate disc
bulging and bilateral facet arthropathy moderate spinal canal
stenosis. Moderate left and mild right neuroforaminal stenosis.

L3-L4: Prior posterior decompression and fusion. No residual
stenosis.

L4-L5: Prior posterior decompression and fusion. No residual
stenosis.

L5-S1: Large left subarticular disc protrusion severe left lateral
recess stenosis and impingement of the descending left S1 nerve
root. No spinal canal or neuroforaminal stenosis.
IMPRESSION: 1. Large left subarticular disc protrusion at L5-S1 with impingement
of the descending left S1 nerve root.
2. Prior L3-L5 posterior fusion without residual stenosis.
3. Severe adjacent segment disease at L2-L3 with moderate spinal
canal and left neuroforaminal stenosis.

## 2021-12-02 DIAGNOSIS — I10 Essential (primary) hypertension: Secondary | ICD-10-CM | POA: Diagnosis not present

## 2021-12-02 DIAGNOSIS — I2510 Atherosclerotic heart disease of native coronary artery without angina pectoris: Secondary | ICD-10-CM | POA: Diagnosis not present

## 2021-12-02 DIAGNOSIS — Z951 Presence of aortocoronary bypass graft: Secondary | ICD-10-CM | POA: Diagnosis not present

## 2021-12-02 DIAGNOSIS — E785 Hyperlipidemia, unspecified: Secondary | ICD-10-CM | POA: Diagnosis not present

## 2022-02-02 ENCOUNTER — Other Ambulatory Visit: Payer: Self-pay | Admitting: Family Medicine

## 2022-02-02 DIAGNOSIS — I10 Essential (primary) hypertension: Secondary | ICD-10-CM

## 2022-02-23 ENCOUNTER — Ambulatory Visit (LOCAL_COMMUNITY_HEALTH_CENTER): Payer: Medicare Other

## 2022-02-23 DIAGNOSIS — Z23 Encounter for immunization: Secondary | ICD-10-CM | POA: Diagnosis not present

## 2022-02-23 DIAGNOSIS — Z719 Counseling, unspecified: Secondary | ICD-10-CM

## 2022-02-23 NOTE — Progress Notes (Addendum)
?  Are you feeling sick today? No ? ? ?Have you ever received a dose of COVID-19 Vaccine? [employer], [place], [place], [place], Other) Yes ? ?If yes, which vaccine and how many doses?   4 doses Pfizer ? ? ?Did you bring the vaccination record card or other documentation?  Yes ? ? ?Do you have a health condition or are undergoing treatment that makes you moderately or severely immunocompromised? This would include, but not be limited to: cancer, HIV, organ transplant, immunosuppressive therapy/high-dose corticosteroids, or moderate/severe primary immunodeficiency.  No ? ?Have you received COVID-19 vaccine before or during hematopoietic cell transplant (HCT) or CAR-T-cell therapies? No ? ?Have you ever had an allergic reaction to: (This would include a severe allergic reaction or a reaction that caused hives, swelling, or respiratory distress, including wheezing.) A component of a COVID-19 vaccine or a previous dose of COVID-19 vaccine? No ? ? ?Have you ever had an allergic reaction to another vaccine (other thanCOVID-19 vaccine) or an injectable medication? (This would include a severe allergic reaction or a reaction that caused hives, swelling, or respiratory distress, including wheezing.)   No ? ?  ?Do you have a history of any of the following: ? ?Myocarditis or Pericarditis No ? ?Dermal fillers:  No ? ?Multisystem Inflammatory Syndrome (MIS-C or MIS-A)? No ? ?COVID-19 disease within the past 3 months? No ? ?Vaccinated with monkeypox vaccine in the last 4 weeks? No ?  ? ?Patient in Nurse's clinic for [place] booster.  Pfizer bivalent +12Y administered in right deltoid. Patient tolerated well.  VIS offered.  [facility] updated and copy provided to patient. ?

## 2022-03-17 ENCOUNTER — Other Ambulatory Visit: Payer: Self-pay | Admitting: Family Medicine

## 2022-03-17 DIAGNOSIS — Z1231 Encounter for screening mammogram for malignant neoplasm of breast: Secondary | ICD-10-CM

## 2022-04-12 ENCOUNTER — Ambulatory Visit
Admission: RE | Admit: 2022-04-12 | Discharge: 2022-04-12 | Disposition: A | Discharge status: Home / Self Care | Payer: Medicare Other | Source: Ambulatory Visit | Attending: Family Medicine | Admitting: Family Medicine

## 2022-04-12 DIAGNOSIS — Z1231 Encounter for screening mammogram for malignant neoplasm of breast: Secondary | ICD-10-CM | POA: Diagnosis not present

## 2022-04-14 NOTE — Progress Notes (Signed)
Negative mammogram for breast cancer. Repeat if desired in 1-2 years. Gwyneth Sprout, [address] [place], [place] [address] [phone] (phone) [phone] (fax) [place]

## 2022-04-22 ENCOUNTER — Other Ambulatory Visit: Payer: Self-pay | Admitting: Family Medicine

## 2022-04-22 DIAGNOSIS — K219 Gastro-esophageal reflux disease without esophagitis: Secondary | ICD-10-CM

## 2022-04-22 NOTE — Telephone Encounter (Signed)
Requested Prescriptions  Pending Prescriptions Disp Refills  . pantoprazole (PROTONIX) 40 MG tablet [Pharmacy Med Name: Pantoprazole Sodium 40 MG Oral Tablet Delayed Release] 200 tablet     Sig: TAKE 1 TABLET BY MOUTH  TWICE DAILY     Gastroenterology: Proton Pump Inhibitors Passed - 04/22/2022  1:26 PM      Passed - Valid encounter within last 12 months    Recent Outpatient Visits          5 months ago Encounter for annual wellness visit (AWV) in Medicare patient   [facility], Dionne Bucy, MD   1 year ago Primary hypertension   [facility] [place], Dionne Bucy, MD   1 year ago Encounter for annual physical exam   [facility], Dionne Bucy, MD   2 years ago [place], Dionne Bucy, MD   2 years ago Encounter for annual physical exam   [facility], Dionne Bucy, MD      Future Appointments            In 1 week Ralene Bathe, MD [place]   In 3 weeks Bacigalupo, Dionne Bucy, MD [facility]           . furosemide (LASIX) 40 MG tablet [Pharmacy Med Name: Furosemide 40 MG Oral Tablet] 100 tablet 0    Sig: TAKE ONE-HALF TABLET BY MOUTH  TWICE DAILY     Cardiovascular:  Diuretics - Loop Failed - 04/22/2022  1:26 PM      Failed - Cr in normal range and within 180 days    Creat  Date Value Ref Range Status  09/28/2017 1.12 (H) 0.60 - 0.93 mg/dL Final    Comment:    For patients >39 years of age, the reference limit for Creatinine is approximately 13% higher for people identified as African-American. .    Creatinine, Ser  Date Value Ref Range Status  11/12/2021 1.22 (H) 0.57 - 1.00 mg/dL Final         Failed - Mg Level in normal range and within 180 days    No results found for: "MG"       Passed - K in normal range and within 180 days    Potassium  Date Value Ref Range Status  11/12/2021 4.6 3.5 - 5.2 mmol/L  Final  01/09/2014 4.3 3.5 - 5.1 mmol/L Final         Passed - Ca in normal range and within 180 days    Calcium  Date Value Ref Range Status  11/12/2021 10.0 8.7 - 10.3 mg/dL Final   Calcium, Total  Date Value Ref Range Status  05/13/2013 8.4 (L) 8.5 - 10.1 mg/dL Final         Passed - Na in normal range and within 180 days    Sodium  Date Value Ref Range Status  11/12/2021 138 134 - 144 mmol/L Final  05/13/2013 142 136 - 145 mmol/L Final         Passed - Cl in normal range and within 180 days    Chloride  Date Value Ref Range Status  11/12/2021 98 96 - 106 mmol/L Final  05/13/2013 109 (H) 98 - 107 mmol/L Final         Passed - Last BP in normal range    BP Readings from Last 1 Encounters:  11/12/21 128/70         Passed -  Valid encounter within last 6 months    Recent Outpatient Visits          5 months ago Encounter for annual wellness visit (AWV) in Medicare patient   [facility] [place], Dionne Bucy, MD   1 year ago Primary hypertension   [facility] [place], Dionne Bucy, MD   1 year ago Encounter for annual physical exam   [facility] [place], Dionne Bucy, MD   2 years ago [place], Dionne Bucy, MD   2 years ago Encounter for annual physical exam   [facility], Dionne Bucy, MD      Future Appointments            In 1 week Ralene Bathe, MD [place]   In 3 weeks Bacigalupo, Dionne Bucy, MD [facility] [place]

## 2022-05-05 ENCOUNTER — Ambulatory Visit: Payer: Medicare Other | Admitting: Dermatology

## 2022-05-05 ENCOUNTER — Encounter: Payer: Self-pay | Admitting: Dermatology

## 2022-05-05 DIAGNOSIS — L720 Epidermal cyst: Secondary | ICD-10-CM | POA: Diagnosis not present

## 2022-05-05 DIAGNOSIS — L578 Other skin changes due to chronic exposure to nonionizing radiation: Secondary | ICD-10-CM

## 2022-05-05 DIAGNOSIS — L570 Actinic keratosis: Secondary | ICD-10-CM | POA: Diagnosis not present

## 2022-05-05 DIAGNOSIS — L820 Inflamed seborrheic keratosis: Secondary | ICD-10-CM | POA: Diagnosis not present

## 2022-05-05 DIAGNOSIS — L821 Other seborrheic keratosis: Secondary | ICD-10-CM | POA: Diagnosis not present

## 2022-05-05 NOTE — Patient Instructions (Signed)
Cryotherapy Aftercare  Wash gently with soap and water everyday.   Apply Vaseline and Band-Aid daily until healed.    Recommend daily broad spectrum sunscreen SPF 30+ to sun-exposed areas, reapply every 2 hours as needed. Call for new or changing lesions.  Staying in the shade or wearing long sleeves, sun glasses (UVA+UVB protection) and wide brim hats (4-inch brim around the entire circumference of the hat) are also recommended for sun protection.    Due to recent changes in healthcare laws, you may see results of your pathology and/or laboratory studies on MyChart before the doctors have had a chance to review them. We understand that in some cases there may be results that are confusing or concerning to you. Please understand that not all results are received at the same time and often the doctors may need to interpret multiple results in order to provide you with the best plan of care or course of treatment. Therefore, we ask that you please give us 2 business days to thoroughly review all your results before contacting the office for clarification. Should we see a critical lab result, you will be contacted sooner.   If You Need Anything After Your Visit  If you have any questions or concerns for your doctor, please call our main line at [phone] and press option 4 to reach your doctor's medical assistant. If no one answers, please leave a voicemail as directed and we will return your call as soon as possible. Messages left after 4 pm will be answered the following business day.   You may also send us a message via MyChart. We typically respond to MyChart messages within 1-2 business days.  For prescription refills, please ask your pharmacy to contact our office. Our fax number is [phone].  If you have an urgent issue when the clinic is closed that cannot wait until the next business day, you can page your doctor at the number below.    Please note that while we do our best to be  available for urgent issues outside of office hours, we are not available 24/7.   If you have an urgent issue and are unable to reach us, you may choose to seek medical care at your doctor's office, retail clinic, urgent care center, or emergency room.  If you have a medical emergency, please immediately call 911 or go to the emergency department.  Pager Numbers  - Dr. Kowalski: [phone]  - Dr. Moye: [phone]  - Dr. Stewart: [phone]  In the event of inclement weather, please call our main line at [phone] for an update on the status of any delays or closures.  Dermatology Medication Tips: Please keep the boxes that topical medications come in in order to help keep track of the instructions about where and how to use these. Pharmacies typically print the medication instructions only on the boxes and not directly on the medication tubes.   If your medication is too expensive, please contact our office at [phone] option 4 or send us a message through MyChart.   We are unable to tell what your co-pay for medications will be in advance as this is different depending on your insurance coverage. However, we may be able to find a substitute medication at lower cost or fill out paperwork to get insurance to cover a needed medication.   If a prior authorization is required to get your medication covered by your insurance company, please allow us 1-2 business days to complete this process.    Drug prices often vary depending on where the prescription is filled and some pharmacies may offer cheaper prices.  The website www.goodrx.com contains coupons for medications through different pharmacies. The prices here do not account for what the cost may be with help from insurance (it may be cheaper with your insurance), but the website can give you the price if you did not use any insurance.  - You can print the associated coupon and take it with your prescription to the pharmacy.  -  You may also stop by our office during regular business hours and pick up a GoodRx coupon card.  - If you need your prescription sent electronically to a different pharmacy, notify our office through [place] MyChart or by phone at [phone] option 4.     Si Usted Necesita Algo Despus de Su Visita  Tambin puede enviarnos un mensaje a travs de MyChart. Por lo general respondemos a los mensajes de MyChart en el transcurso de 1 a 2 das hbiles.  Para renovar recetas, por favor pida a su farmacia que se ponga en contacto con nuestra oficina. Nuestro nmero de fax es el [phone].  Si tiene un asunto urgente cuando la clnica est cerrada y que no puede esperar hasta el siguiente da hbil, puede llamar/localizar a su doctor(a) al nmero que aparece a continuacin.   Por favor, tenga en cuenta que aunque hacemos todo lo posible para estar disponibles para asuntos urgentes fuera del horario de oficina, no estamos disponibles las 24 horas del da, los 7 das de la semana.   Si tiene un problema urgente y no puede comunicarse con nosotros, puede optar por buscar atencin mdica  en el consultorio de su doctor(a), en una clnica privada, en un centro de atencin urgente o en una sala de emergencias.  Si tiene una emergencia mdica, por favor llame inmediatamente al 911 o vaya a la sala de emergencias.  Nmeros de bper  - Dr. Kowalski: [phone]  - Dra. Moye: [phone]  - Dra. Stewart: [phone]  En caso de inclemencias del tiempo, por favor llame a nuestra lnea principal al [phone] para una actualizacin sobre el estado de cualquier retraso o cierre.  Consejos para la medicacin en dermatologa: Por favor, guarde las cajas en las que vienen los medicamentos de uso tpico para ayudarle a seguir las instrucciones sobre dnde y cmo usarlos. Las farmacias generalmente imprimen las instrucciones del medicamento slo en las cajas y no directamente en los tubos del  medicamento.   Si su medicamento es muy caro, por favor, pngase en contacto con nuestra oficina llamando al [phone] y presione la opcin 4 o envenos un mensaje a travs de MyChart.   No podemos decirle cul ser su copago por los medicamentos por adelantado ya que esto es diferente dependiendo de la cobertura de su seguro. Sin embargo, es posible que podamos encontrar un medicamento sustituto a menor costo o llenar un formulario para que el seguro cubra el medicamento que se considera necesario.   Si se requiere una autorizacin previa para que su compaa de seguros cubra su medicamento, por favor permtanos de 1 a 2 das hbiles para completar este proceso.  Los precios de los medicamentos varan con frecuencia dependiendo del lugar de dnde se surte la receta y alguna farmacias pueden ofrecer precios ms baratos.  El sitio web www.goodrx.com tiene cupones para medicamentos de diferentes farmacias. Los precios aqu no tienen en cuenta lo que podra costar con la ayuda del seguro (puede ser ms   barato con su seguro), pero el sitio web puede darle el precio si no utiliz ningn seguro.  - Puede imprimir el cupn correspondiente y llevarlo con su receta a la farmacia.  - Tambin puede pasar por nuestra oficina durante el horario de atencin regular y recoger una tarjeta de cupones de GoodRx.  - Si necesita que su receta se enve electrnicamente a una farmacia diferente, informe a nuestra oficina a travs de MyChart de [place] o por telfono llamando al [phone] y presione la opcin 4.  

## 2022-05-05 NOTE — Progress Notes (Signed)
Follow-Up Visit   Subjective  Sheila Kim is a 81 y.o. female who presents for the following: Actinic Keratosis (6 month recheck. Nose. Tx with LN2 at last visit. Has other scaly areas of concern on face today). The patient has spots, moles and lesions to be evaluated, some may be new or changing and the patient has concerns that these could be cancer.  The following portions of the chart were reviewed this encounter and updated as appropriate:  Tobacco  Allergies  Meds  Problems  Med Hx  Surg Hx  Fam Hx     Review of Systems: No other skin or systemic complaints except as noted in HPI or Assessment and Plan.  Objective  Well appearing patient in no apparent distress; mood and affect are within normal limits.  A focused examination was performed including face, neck, chest and back. Relevant physical exam findings are noted in the Assessment and Plan.  Left  temple x1, left hand x2 (3) Erythematous keratotic or waxy stuck-on papule or plaque.  Right Zygomatic Area x1, left nose x1, right lateral nose x1 (3) Large thin pink scaly patch   Assessment & Plan   Milia - tiny firm white papules - type of cyst - benign - may be extracted if symptomatic - observe  Seborrheic Keratoses - Stuck-on, waxy, tan-brown papules and/or plaques  - Benign-appearing - Discussed benign etiology and prognosis. - Observe - Call for any changes  Actinic Damage - chronic, secondary to cumulative UV radiation exposure/sun exposure over time - diffuse scaly erythematous macules with underlying dyspigmentation - Recommend daily broad spectrum sunscreen SPF 30+ to sun-exposed areas, reapply every 2 hours as needed.  - Recommend staying in the shade or wearing long sleeves, sun glasses (UVA+UVB protection) and wide brim hats (4-inch brim around the entire circumference of the hat). - Call for new or changing lesions.   Inflamed seborrheic keratosis (3) Left  temple x1, left hand  x2 Symptomatic, irritating, patient would like treated. Destruction of lesion - Left  temple x1, left hand x2 Complexity: simple   Destruction method: cryotherapy   Informed consent: discussed and consent obtained   Timeout:  patient name, date of birth, surgical site, and procedure verified Lesion destroyed using liquid nitrogen: Yes   Region frozen until ice ball extended beyond lesion: Yes   Outcome: patient tolerated procedure well with no complications   Post-procedure details: wound care instructions given   Additional details:  Prior to procedure, discussed risks of blister formation, small wound, skin dyspigmentation, or rare scar following cryotherapy. Recommend Vaseline ointment to treated areas while healing.  AK (actinic keratosis) (3) Right Zygomatic Area x1, left nose x1, right lateral nose x1 Actinic keratoses are precancerous spots that appear secondary to cumulative UV radiation exposure/sun exposure over time. They are chronic with expected duration over 1 year. A portion of actinic keratoses will progress to squamous cell carcinoma of the skin. It is not possible to reliably predict which spots will progress to skin cancer and so treatment is recommended to prevent development of skin cancer.  Recommend daily broad spectrum sunscreen SPF 30+ to sun-exposed areas, reapply every 2 hours as needed.  Recommend staying in the shade or wearing long sleeves, sun glasses (UVA+UVB protection) and wide brim hats (4-inch brim around the entire circumference of the hat). Call for new or changing lesions. Destruction of lesion - Right Zygomatic Area x1, left nose x1, right lateral nose x1 Complexity: simple   Destruction method: cryotherapy  Informed consent: discussed and consent obtained   Timeout:  patient name, date of birth, surgical site, and procedure verified Lesion destroyed using liquid nitrogen: Yes   Region frozen until ice ball extended beyond lesion: Yes   Outcome:  patient tolerated procedure well with no complications   Post-procedure details: wound care instructions given   Additional details:  Prior to procedure, discussed risks of blister formation, small wound, skin dyspigmentation, or rare scar following cryotherapy. Recommend Vaseline ointment to treated areas while healing.  Return in about 1 year (around 05/06/2023) for TBSE.  I, Emelia Salisbury, CMA, am acting as scribe for Sarina Ser, MD. Documentation: I have reviewed the above documentation for accuracy and completeness, and I agree with the above.  Sarina Ser, MD

## 2022-05-09 ENCOUNTER — Encounter: Payer: Self-pay | Admitting: Dermatology

## 2022-05-13 ENCOUNTER — Encounter: Payer: Self-pay | Admitting: Family Medicine

## 2022-05-13 ENCOUNTER — Ambulatory Visit: Payer: Medicare Other | Admitting: Family Medicine

## 2022-05-13 ENCOUNTER — Ambulatory Visit (INDEPENDENT_AMBULATORY_CARE_PROVIDER_SITE_OTHER): Payer: Medicare Other | Admitting: Family Medicine

## 2022-05-13 VITALS — BP 128/73 | HR 63 | Temp 98.5°F | Resp 16 | Wt 120.8 lb

## 2022-05-13 DIAGNOSIS — E7800 Pure hypercholesterolemia, unspecified: Secondary | ICD-10-CM

## 2022-05-13 DIAGNOSIS — I10 Essential (primary) hypertension: Secondary | ICD-10-CM

## 2022-05-13 DIAGNOSIS — R7303 Prediabetes: Secondary | ICD-10-CM | POA: Diagnosis not present

## 2022-05-13 NOTE — Assessment & Plan Note (Signed)
Chronic, elevated Continues Crestor 5 mg 5 days a week Active on treadmill and stretching routine 6 days a week Follows vegetarian diet, with added fish since 81 years old Repeat LP Followed by cardiology

## 2022-05-13 NOTE — Progress Notes (Signed)
I,Sulibeya S Dimas,acting as a Education administrator for Gwyneth Sprout, FNP.,have documented all relevant documentation on the behalf of Gwyneth Sprout, FNP,as directed by  Gwyneth Sprout, FNP while in the presence of Gwyneth Sprout, FNP.   Established patient visit   Patient: Sheila Kim   DOB: [birth year]   81 y.o. Female  MRN: [id number] Visit Date: 05/13/2022  Today's healthcare provider: Gwyneth Sprout, FNP   Chief Complaint  Patient presents with   Hypertension   Hyperlipidemia   Prediabetes   Subjective    Hypertension Pertinent negatives include no chest pain, headaches, neck pain, palpitations or shortness of breath.  Hyperlipidemia Pertinent negatives include no chest pain, myalgias or shortness of breath.    Prediabetes, Follow-up  Lab Results  Component Value Date   HGBA1C 6.1 (H) 11/12/2021   HGBA1C 6.0 (A) 04/16/2021   HGBA1C 5.9 (H) 10/16/2020   GLUCOSE 121 (H) 11/12/2021   GLUCOSE 100 (H) 04/16/2021   GLUCOSE 104 (H) 10/16/2020    Last seen for for this6 months ago.  Management since that visit includes no changes. Current symptoms include none and have been stable.  Prior visit with dietician: no Current diet: low salt, vegetarian Current exercise: cardiovascular workout on exercise equipment  Pertinent Labs:    Component Value Date/Time   CHOL 219 (H) 11/12/2021 1057   TRIG 234 (H) 11/12/2021 1057   CHOLHDL 4.5 (H) 11/12/2021 1057   CHOLHDL 4.2 09/28/2017 0840   CREATININE 1.22 (H) 11/12/2021 1057   CREATININE 1.12 (H) 09/28/2017 0840    Wt Readings from Last 3 Encounters:  05/13/22 120 lb 12.8 oz (54.8 kg)  11/12/21 122 lb 11.2 oz (55.7 kg)  04/16/21 119 lb 1.6 oz (54 kg)    -----------------------------------------------------------------------------------------  Hypertension, follow-up  BP Readings from Last 3 Encounters:  05/13/22 128/73  11/12/21 128/70  04/16/21 135/79   Wt Readings from Last 3 Encounters:  05/13/22 120 lb 12.8  oz (54.8 kg)  11/12/21 122 lb 11.2 oz (55.7 kg)  04/16/21 119 lb 1.6 oz (54 kg)     She was last seen for hypertension 6 months ago.  BP at that visit was 128/70. Management since that visit includes no changes. She reports excellent compliance with treatment. She is not having side effects.  She is adherent to low salt diet.   Outside blood pressures are stable.  She does not smoke.  Use of agents associated with hypertension: none.   --------------------------------------------------------------------------------------------------- Lipid/Cholesterol, follow-up  Last Lipid Panel: Lab Results  Component Value Date   CHOL 219 (H) 11/12/2021   LDLCALC 129 (H) 11/12/2021   HDL 49 11/12/2021   TRIG 234 (H) 11/12/2021    She was last seen for this 6 months ago.  Management since that visit includes no changes.  She reports excellent compliance with treatment. She is not having side effects.   Symptoms: No appetite changes No foot ulcerations  No chest pain No chest pressure/discomfort  No dyspnea No orthopnea  No fatigue Yes lower extremity edema  No palpitations No paroxysmal nocturnal dyspnea  No nausea Yes numbness or tingling of extremity  No polydipsia No polyuria  No speech difficulty No syncope   She is following a Low Sodium diet. Current exercise: cardiovascular workout on exercise equipment  Last metabolic panel Lab Results  Component Value Date   GLUCOSE 121 (H) 11/12/2021   NA 138 11/12/2021   K 4.6 11/12/2021   BUN 20 11/12/2021  CREATININE 1.22 (H) 11/12/2021   EGFR 44 (L) 11/12/2021   GFRNONAA 41 (L) 10/16/2020   CALCIUM 10.0 11/12/2021   AST 24 11/12/2021   ALT 16 11/12/2021   The ASCVD Risk score (Arnett DK, et al., 2019) failed to calculate for the following reasons:   The 2019 ASCVD risk score is only valid for ages 18 to 68   The patient has a prior MI or stroke  diagnosis  ---------------------------------------------------------------------------------------------------   Medications: Outpatient Medications Prior to Visit  Medication Sig   acetaminophen (TYLENOL) 500 MG tablet Take 500 mg by mouth. In the PM and as needed   acetaminophen (TYLENOL) 650 MG CR tablet Take 650 mg by mouth. 2 tablets in the AM and 1 tablet in the PM   Alpha-D-Galactosidase (BEANO TO GO) TABS Take by mouth daily at 6 (six) AM. occasionally   aspirin 81 MG tablet Take 81 mg by mouth daily.    CALCIUM CITRATE PO Take 315 mg by mouth 2 (two) times daily.    cholecalciferol (VITAMIN D) 1000 UNITS tablet Take 1,000 Units by mouth daily.    Coenzyme Q10 (COQ10) 100 MG CAPS Take 200 mg by mouth daily.    cyanocobalamin 1000 MCG tablet Take 1,000 mcg by mouth daily.    famotidine (PEPCID) 20 MG tablet Take 20 mg by mouth 2 (two) times daily.   ferrous sulfate 325 (65 FE) MG tablet Take 325 mg by mouth daily with breakfast.    fluticasone (FLONASE) 50 MCG/ACT nasal spray Place 2 sprays into both nostrils daily.   folic acid (FOLVITE) 1 MG tablet Take 1 mg by mouth daily.    furosemide (LASIX) 40 MG tablet TAKE ONE-HALF TABLET BY MOUTH  TWICE DAILY   ketotifen (ZADITOR) 0.025 % ophthalmic solution Place 1 drop into both eyes as needed.   loratadine (CLARITIN) 10 MG tablet Take 10 mg by mouth daily.    MELATONIN EXTRA STRENGTH PO Take 0.25 tablets by mouth at bedtime as needed.    Multiple Vitamins-Minerals ([employer] SPECTRAVITE ADULT 50+ PO) Education officer, community - Historical Medication  daily Active   pantoprazole (PROTONIX) 40 MG tablet Take 1 tablet (40 mg total) by mouth 2 (two) times daily.   Polyethyl Glycol-Propyl Glycol 0.4-0.3 % SOLN Apply to eye as needed.   polyethylene glycol (MIRALAX / GLYCOLAX) packet Take 17 g by mouth daily.   potassium chloride SA (KLOR-CON M) 20 MEQ tablet Take 1 tablet (20 mEq total) by mouth daily.   Probiotic Product ([employer]S  SENIOR PROBIOTIC) CAPS Take by mouth daily.    propranolol (INDERAL) 40 MG tablet TAKE ONE-HALF TABLET BY  MOUTH TWICE DAILY   Psyllium Husk POWD Take by mouth daily.    pyridoxine (B-6) 100 MG tablet Take 100 mg by mouth daily.    rosuvastatin (CRESTOR) 5 MG tablet TAKE 1 TABLET DAILY EXCEPT THURSDAY AND SUNDAY   No facility-administered medications prior to visit.    Review of Systems  Constitutional:  Negative for activity change, appetite change, chills, fatigue and fever.  HENT:  Negative for ear pain, sinus pressure, sinus pain and sore throat.   Eyes:  Negative for pain and visual disturbance.  Respiratory:  Negative for cough, chest tightness, shortness of breath and wheezing.   Cardiovascular:  Positive for leg swelling. Negative for chest pain and palpitations.  Gastrointestinal:  Negative for abdominal pain, blood in stool, diarrhea, nausea and vomiting.  Genitourinary:  Negative for flank pain, frequency, pelvic pain and urgency.  Musculoskeletal:  Negative for back pain, myalgias and neck pain.  Neurological:  Positive for numbness. Negative for dizziness, weakness, light-headedness and headaches.        Objective    BP 128/73 (BP Location: Left Arm, Patient Position: Sitting, Cuff Size: Large)   Pulse 63   Temp 98.5 F (36.9 C) (Oral)   Resp 16   Wt 120 lb 12.8 oz (54.8 kg)   BMI 27.08 kg/m  BP Readings from Last 3 Encounters:  05/13/22 128/73  11/12/21 128/70  04/16/21 135/79   Wt Readings from Last 3 Encounters:  05/13/22 120 lb 12.8 oz (54.8 kg)  11/12/21 122 lb 11.2 oz (55.7 kg)  04/16/21 119 lb 1.6 oz (54 kg)      Physical Exam Vitals and nursing note reviewed.  Constitutional:      General: She is not in acute distress.    Appearance: Normal appearance. She is overweight. She is not ill-appearing, toxic-appearing or diaphoretic.  HENT:     Head: Normocephalic and atraumatic.  Cardiovascular:     Rate and Rhythm: Normal rate and regular rhythm.      Pulses: Normal pulses.     Heart sounds: Normal heart sounds. No murmur heard.    No friction rub. No gallop.  Pulmonary:     Effort: Pulmonary effort is normal. No respiratory distress.     Breath sounds: Normal breath sounds. No stridor. No wheezing, rhonchi or rales.  Chest:     Chest wall: No tenderness.  Abdominal:     General: Bowel sounds are normal.     Palpations: Abdomen is soft.  Musculoskeletal:        General: No swelling, tenderness, deformity or signs of injury. Normal range of motion.     Right lower leg: No edema.     Left lower leg: No edema.     Comments: Endorses LLE edema; however, not present today. LLE has wider circumference than RLE; likely d/t CABG vein harvest.   Skin:    General: Skin is warm and dry.     Capillary Refill: Capillary refill takes less than 2 seconds.     Coloration: Skin is not jaundiced or pale.     Findings: No bruising, erythema, lesion or rash.     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     Mental Status: She is alert and oriented to person, place, and time. Mental status is at baseline.     Cranial Nerves: No cranial nerve deficit.     Sensory: No sensory deficit.     Motor: No weakness.     Coordination: Coordination normal.  Psychiatric:        Mood and Affect: Mood normal.        Behavior: Behavior normal.        Thought Content: Thought content normal.        Judgment: Judgment normal.     No results found for any visits on 05/13/22.  Assessment & Plan     Problem List Items Addressed This Visit       Cardiovascular and Mediastinum   BP (high blood pressure) - Primary    Chronic, stable At goal <130/<80 Denies CP Denies SOB/ DOE Denies low blood pressure/hypotension Denies vision changes No LE Edema noted on exam Continue medication, Lasix 20 mg BID, Propranolol 20 mg BID Denies side effects F/B Cardiology d/t  hx  of CABG x4 Seek emergent care if you develop chest pain or chest pressure       Relevant Orders   Comprehensive metabolic panel     Other   Hypercholesteremia    Chronic, elevated Continues Crestor 5 mg 5 days a week Active on treadmill and stretching routine 6 days a week Follows vegetarian diet, with added fish since 81 years old Repeat LP Followed by cardiology      Relevant Orders   Lipid Panel With LDL/HDL Ratio   Prediabetes    Chronic, stable Repeat A1c Strong family history Continue to recommend balanced, lower carb meals. Smaller meal size, adding snacks. Choosing water as drink of choice and increasing purposeful exercise. Currently controlled with diet/exercise       Relevant Orders   Hemoglobin A1c    Return in about 6 months (around 11/13/2022) for annual examination.      Vonna Kotyk, FNP, have reviewed all documentation for this visit. The documentation on 05/13/22 for the exam, diagnosis, procedures, and orders are all accurate and complete.    Gwyneth Sprout, [place] [phone] (phone) [phone] (fax)  [place]

## 2022-05-13 NOTE — Assessment & Plan Note (Signed)
Chronic, stable At goal <130/<80 Denies CP Denies SOB/ DOE Denies low blood pressure/hypotension Denies vision changes No LE Edema noted on exam Continue medication, Lasix 20 mg BID, Propranolol 20 mg BID Denies side effects F/B Cardiology d/t hx of CABG x4 Seek emergent care if you develop chest pain or chest pressure

## 2022-05-13 NOTE — Assessment & Plan Note (Signed)
Chronic, stable Repeat A1c Strong family history Continue to recommend balanced, lower carb meals. Smaller meal size, adding snacks. Choosing water as drink of choice and increasing purposeful exercise. Currently controlled with diet/exercise

## 2022-05-14 ENCOUNTER — Other Ambulatory Visit: Payer: Self-pay | Admitting: Family Medicine

## 2022-05-14 DIAGNOSIS — N189 Chronic kidney disease, unspecified: Secondary | ICD-10-CM

## 2022-05-14 DIAGNOSIS — I2510 Atherosclerotic heart disease of native coronary artery without angina pectoris: Secondary | ICD-10-CM

## 2022-05-14 LAB — COMPREHENSIVE METABOLIC PANEL
ALT: 13 IU/L (ref 0–32)
AST: 23 IU/L (ref 0–40)
Albumin/Globulin Ratio: 1.9 (ref 1.2–2.2)
Albumin: 4.7 g/dL (ref 3.7–4.7)
Alkaline Phosphatase: 53 IU/L (ref 44–121)
BUN/Creatinine Ratio: 14 (ref 12–28)
BUN: 18 mg/dL (ref 8–27)
Bilirubin Total: 0.9 mg/dL (ref 0.0–1.2)
CO2: 23 mmol/L (ref 20–29)
Calcium: 10.2 mg/dL (ref 8.7–10.3)
Chloride: 98 mmol/L (ref 96–106)
Creatinine, Ser: 1.27 mg/dL — ABNORMAL HIGH (ref 0.57–1.00)
Globulin, Total: 2.5 g/dL (ref 1.5–4.5)
Glucose: 108 mg/dL — ABNORMAL HIGH (ref 70–99)
Potassium: 5.2 mmol/L (ref 3.5–5.2)
Sodium: 140 mmol/L (ref 134–144)
Total Protein: 7.2 g/dL (ref 6.0–8.5)
eGFR: 42 mL/min/{1.73_m2} — ABNORMAL LOW (ref >59)

## 2022-05-14 LAB — LIPID PANEL WITH LDL/HDL RATIO
Cholesterol, Total: 223 mg/dL — ABNORMAL HIGH (ref 100–199)
HDL: 48 mg/dL (ref >39)
LDL Chol Calc (NIH): 129 mg/dL — ABNORMAL HIGH (ref 0–99)
LDL/HDL Ratio: 2.7 ratio (ref 0.0–3.2)
Triglycerides: 258 mg/dL — ABNORMAL HIGH (ref 0–149)
VLDL Cholesterol Cal: 46 mg/dL — ABNORMAL HIGH (ref 5–40)

## 2022-05-14 LAB — HEMOGLOBIN A1C
Est. average glucose Bld gHb Est-mCnc: 126 mg/dL
Hgb A1c MFr Bld: 6 % — ABNORMAL HIGH (ref 4.8–5.6)

## 2022-05-14 MED ORDER — ROSUVASTATIN CALCIUM 40 MG PO TABS: 40.0000 mg | ORAL_TABLET | Freq: Every day | ORAL | 3 refills | Status: DC

## 2022-05-14 MED ORDER — FENOFIBRATE 145 MG PO TABS: 145.0000 mg | ORAL_TABLET | Freq: Every day | ORAL | 3 refills | Status: DC

## 2022-05-14 NOTE — Progress Notes (Signed)
Stable kidney decline noted on blood chemistry. Avoid NSAIDs and alcohol. Ensure 64 oz of water daily.  Cholesterol is elevated; recommend adding fenofibrate to assist with fat elevation given previous failure of fish oils. LDL remains elevated as well. Recommend increase strength of statin or combined use of injection cholesterol medication to assist.  A1c stable; Continue to recommend balanced, lower carb meals. Smaller meal size, adding snacks. Choosing water as drink of choice and increasing purposeful exercise.  Please let us know if you have any questions.  Thank you, Gwyneth Sprout, [address] [place], [place] [address] [phone] (phone) [phone] (fax) [place]

## 2022-05-20 ENCOUNTER — Ambulatory Visit: Payer: Self-pay | Admitting: *Deleted

## 2022-05-20 NOTE — Telephone Encounter (Signed)
  Chief Complaint: Crestor 40 mg was sent in instead of the 5 mg she usually takes.   She was upset that nothing was mentioned to her about the dose change.   "I wish Tally Joe would have discussed this with me prior to just sending it to my mail order pharmacy. Symptoms: Upset with the lack of communication regarding the Crestor dosage.   Now she has to return the 40 mg back to her mail order pharmacy and get the 5 mg.    A new prescription is being sent from the pharmacy to [facility] - . Frequency: Today Pertinent Negatives: Patient denies N/A Disposition: '[]'$ ED /'[]'$ Urgent Care (no appt availability in office) / '[]'$ Appointment(In office/virtual)/ '[]'$  [facility]/ '[]'$ Home Care/ '[]'$ Refused Recommended Disposition /'[]'$ [facility]/ '[x]'$  Follow-up with PCP Additional Notes: Information passed along to [employer].

## 2022-05-20 NOTE — Telephone Encounter (Signed)
Reason for Disposition  Caller has medicine question only, adult not sick, AND triager answers question  Answer Assessment - Initial Assessment Questions 1. NAME of MEDICINE: "What medicine(s) are you calling about?"     Crestor 2. QUESTION: "What is your question?" (e.g., double dose of medicine, side effect)     I've been taking Crestor for several years over 10 years.  I tolerate 5 mg 5 days a week.  If I take it more than that I get terrible muscle aches. I saw Tally Joe.    She did not mention anything to me.   The next day they told me about the lab results that Daneil Dan wants her to try a med to lower my triglycerides.   I said I would try it.     Today in the mail I received 40 mg Crestor tablets along with the other med I was willing to try. The pharmacist said Daneil Dan prescribed it for me.    I spent 30 minutes getting a return package from [facility] to mail it back to him.   The [facility] is sending a request to the dr office for a refill.   I want the 5 mg Crestor not the 40 mg.    I only take it 5 days a week.   My cardiologist is ok with me taking it 5 days a week.    I will start the other medication tonight.      I wish she had discussed it with me about how I take the Crestor before just ordering it 40 mg for me to take 7 days a week.     I let her know I would pass this information on to [place] that I wasn't sure where the break down in communication was but that would let Daneil Dan know.   Pt was agreeable to this and thanked me very much for allowing her to complain.  I let her know I was glad to be available and I would see to it that Daneil Dan got the information.  Pt also asked when "Dr. Jacinto Reap" would be returning from maternity leave.  I let her know in Oct.     Pt mentioned,  "I hope she doesn't decide to retire and take care of the baby".   "I was developing a good rapport with her".     3. PRESCRIBER: "Who prescribed the medicine?" Reason: if prescribed by specialist, call should be  referred to that group.     Tally Joe 4. SYMPTOMS: "Do you have any symptoms?" If Yes, ask: "What symptoms are you having?"  "How bad are the symptoms (e.g., mild, moderate, severe)     N/A 5. PREGNANCY:  "Is there any chance that you are pregnant?" "When was your last menstrual period?"     N/A  Protocols used: Medication Question Call-A-AH

## 2022-05-31 ENCOUNTER — Other Ambulatory Visit: Payer: Self-pay | Admitting: Family Medicine

## 2022-05-31 MED ORDER — ROSUVASTATIN CALCIUM 5 MG PO TABS: 5.0000 mg | ORAL_TABLET | Freq: Every day | ORAL | 3 refills | Status: DC

## 2022-06-01 ENCOUNTER — Telehealth: Payer: Self-pay

## 2022-06-01 NOTE — Telephone Encounter (Signed)
Copied from [place] [id number]. Topic: General - Other >> May 31, 2022  3:41 PM Oley Balm E wrote: Reason for CRM: Pt called back regarding her missing medication. Please see triage note

## 2022-06-01 NOTE — Telephone Encounter (Signed)
Spoke with patient and addressed her concerns.

## 2022-06-02 DIAGNOSIS — I213 ST elevation (STEMI) myocardial infarction of unspecified site: Secondary | ICD-10-CM | POA: Diagnosis not present

## 2022-06-02 DIAGNOSIS — I10 Essential (primary) hypertension: Secondary | ICD-10-CM | POA: Diagnosis not present

## 2022-06-02 DIAGNOSIS — E785 Hyperlipidemia, unspecified: Secondary | ICD-10-CM | POA: Diagnosis not present

## 2022-06-02 DIAGNOSIS — I519 Heart disease, unspecified: Secondary | ICD-10-CM | POA: Diagnosis not present

## 2022-06-02 DIAGNOSIS — Z951 Presence of aortocoronary bypass graft: Secondary | ICD-10-CM | POA: Diagnosis not present

## 2022-06-02 DIAGNOSIS — I2510 Atherosclerotic heart disease of native coronary artery without angina pectoris: Secondary | ICD-10-CM | POA: Diagnosis not present

## 2022-06-02 NOTE — Telephone Encounter (Signed)
Spoke with patient 2 days ago and addressed her medication concerns.

## 2022-06-30 ENCOUNTER — Other Ambulatory Visit: Payer: Self-pay | Admitting: Family Medicine

## 2022-07-08 DIAGNOSIS — I129 Hypertensive chronic kidney disease with stage 1 through stage 4 chronic kidney disease, or unspecified chronic kidney disease: Secondary | ICD-10-CM | POA: Diagnosis not present

## 2022-07-08 DIAGNOSIS — N189 Chronic kidney disease, unspecified: Secondary | ICD-10-CM | POA: Diagnosis not present

## 2022-07-08 DIAGNOSIS — R8290 Unspecified abnormal findings in urine: Secondary | ICD-10-CM | POA: Diagnosis not present

## 2022-07-08 DIAGNOSIS — E1122 Type 2 diabetes mellitus with diabetic chronic kidney disease: Secondary | ICD-10-CM | POA: Diagnosis not present

## 2022-07-08 DIAGNOSIS — I10 Essential (primary) hypertension: Secondary | ICD-10-CM | POA: Diagnosis not present

## 2022-07-09 ENCOUNTER — Other Ambulatory Visit: Payer: Self-pay | Admitting: Nephrology

## 2022-07-09 DIAGNOSIS — I129 Hypertensive chronic kidney disease with stage 1 through stage 4 chronic kidney disease, or unspecified chronic kidney disease: Secondary | ICD-10-CM

## 2022-07-09 DIAGNOSIS — I10 Essential (primary) hypertension: Secondary | ICD-10-CM

## 2022-07-09 DIAGNOSIS — R8290 Unspecified abnormal findings in urine: Secondary | ICD-10-CM

## 2022-07-14 ENCOUNTER — Other Ambulatory Visit: Payer: Self-pay | Admitting: Family Medicine

## 2022-07-14 DIAGNOSIS — K219 Gastro-esophageal reflux disease without esophagitis: Secondary | ICD-10-CM

## 2022-07-14 NOTE — Telephone Encounter (Signed)
Requested Prescriptions  Pending Prescriptions Disp Refills  . pantoprazole (PROTONIX) 40 MG tablet [Pharmacy Med Name: Pantoprazole Sodium 40 MG Oral Tablet Delayed Release] 180 tablet 1    Sig: TAKE 1 TABLET BY MOUTH  TWICE DAILY     Gastroenterology: Proton Pump Inhibitors Passed - 07/14/2022  6:40 AM      Passed - Valid encounter within last 12 months    Recent Outpatient Visits          2 months ago Primary hypertension   [facility] Gwyneth Sprout, FNP   8 months ago Encounter for annual wellness visit (AWV) in Medicare patient   [facility] [place], Dionne Bucy, MD   1 year ago Primary hypertension   [facility] [place], Dionne Bucy, MD   1 year ago Encounter for annual physical exam   [facility] [place], Dionne Bucy, MD   2 years ago [place], Dionne Bucy, MD      Future Appointments            In 9 months Ralene Bathe, MD [place]

## 2022-07-16 ENCOUNTER — Ambulatory Visit
Admission: RE | Admit: 2022-07-16 | Discharge: 2022-07-16 | Disposition: A | Discharge status: Home / Self Care | Payer: Medicare Other | Source: Ambulatory Visit | Attending: Nephrology | Admitting: Nephrology

## 2022-07-16 DIAGNOSIS — R8290 Unspecified abnormal findings in urine: Secondary | ICD-10-CM | POA: Insufficient documentation

## 2022-07-16 DIAGNOSIS — I10 Essential (primary) hypertension: Secondary | ICD-10-CM | POA: Insufficient documentation

## 2022-07-16 DIAGNOSIS — I129 Hypertensive chronic kidney disease with stage 1 through stage 4 chronic kidney disease, or unspecified chronic kidney disease: Secondary | ICD-10-CM | POA: Insufficient documentation

## 2022-07-16 DIAGNOSIS — N189 Chronic kidney disease, unspecified: Secondary | ICD-10-CM | POA: Diagnosis not present

## 2022-07-16 DIAGNOSIS — R188 Other ascites: Secondary | ICD-10-CM | POA: Diagnosis not present

## 2022-07-22 ENCOUNTER — Other Ambulatory Visit: Payer: Self-pay

## 2022-07-22 MED ORDER — COMIRNATY 30 MCG/0.3ML IM SUSP
INTRAMUSCULAR | 0 refills | Status: DC
  Filled 2022-07-23: qty 0.3, 1d supply, fill #0

## 2022-07-23 ENCOUNTER — Other Ambulatory Visit: Payer: Self-pay

## 2022-07-23 MED ORDER — FLUAD QUADRIVALENT 0.5 ML IM PRSY
PREFILLED_SYRINGE | INTRAMUSCULAR | 0 refills | Status: DC
  Filled 2022-07-23: qty 0.5, 1d supply, fill #0

## 2022-07-28 ENCOUNTER — Ambulatory Visit: Payer: Medicare Other

## 2022-09-21 DIAGNOSIS — I129 Hypertensive chronic kidney disease with stage 1 through stage 4 chronic kidney disease, or unspecified chronic kidney disease: Secondary | ICD-10-CM | POA: Diagnosis not present

## 2022-09-21 DIAGNOSIS — E1122 Type 2 diabetes mellitus with diabetic chronic kidney disease: Secondary | ICD-10-CM | POA: Diagnosis not present

## 2022-09-21 DIAGNOSIS — R8290 Unspecified abnormal findings in urine: Secondary | ICD-10-CM | POA: Diagnosis not present

## 2022-09-21 DIAGNOSIS — I10 Essential (primary) hypertension: Secondary | ICD-10-CM | POA: Diagnosis not present

## 2022-09-29 ENCOUNTER — Other Ambulatory Visit: Payer: Self-pay | Admitting: Family Medicine

## 2022-09-29 DIAGNOSIS — K219 Gastro-esophageal reflux disease without esophagitis: Secondary | ICD-10-CM

## 2022-09-29 NOTE — Telephone Encounter (Signed)
Last RF 07/14/22 #180 1 RF too soon  Requested Prescriptions  Refused Prescriptions Disp Refills   pantoprazole (PROTONIX) 40 MG tablet [Pharmacy Med Name: Pantoprazole Sodium 40 MG Oral Tablet Delayed Release] 200 tablet 2    Sig: TAKE 1 TABLET BY MOUTH TWICE  DAILY     Gastroenterology: Proton Pump Inhibitors Passed - 09/29/2022  6:21 AM      Passed - Valid encounter within last 12 months    Recent Outpatient Visits           4 months ago Primary hypertension   [facility] Gwyneth Sprout, FNP   10 months ago Encounter for annual wellness visit (AWV) in Medicare patient   [facility], Dionne Bucy, MD   1 year ago Primary hypertension   [facility], Dionne Bucy, MD   1 year ago Encounter for annual physical exam   [facility] [place], Dionne Bucy, MD   2 years ago [place], Dionne Bucy, MD       Future Appointments             In 6 months Ralene Bathe, MD [place]

## 2022-10-26 DIAGNOSIS — Z961 Presence of intraocular lens: Secondary | ICD-10-CM | POA: Diagnosis not present

## 2022-10-26 LAB — HM DIABETES EYE EXAM

## 2022-11-02 ENCOUNTER — Encounter: Payer: Self-pay | Admitting: Family Medicine

## 2022-11-08 ENCOUNTER — Other Ambulatory Visit: Payer: Self-pay | Admitting: Family Medicine

## 2022-11-08 DIAGNOSIS — K219 Gastro-esophageal reflux disease without esophagitis: Secondary | ICD-10-CM

## 2022-11-09 NOTE — Telephone Encounter (Signed)
Last RF 07/14/22 #180 1 RF Requested Prescriptions  Refused Prescriptions Disp Refills   pantoprazole (PROTONIX) 40 MG tablet [Pharmacy Med Name: Pantoprazole Sodium 40 MG Oral Tablet Delayed Release] 200 tablet 2    Sig: TAKE 1 TABLET BY MOUTH TWICE  DAILY     Gastroenterology: Proton Pump Inhibitors Passed - 11/08/2022 10:23 AM      Passed - Valid encounter within last 12 months    Recent Outpatient Visits           6 months ago Primary hypertension    Gwyneth Sprout, FNP   12 months ago Encounter for annual wellness visit (AWV) in Medicare patient   [place], Dionne Bucy, MD   1 year ago Primary hypertension   [place], Dionne Bucy, MD   2 years ago Encounter for annual physical exam   [facility] [place], Dionne Bucy, MD   2 years ago [place], Dionne Bucy, MD       Future Appointments             In 5 months Ralene Bathe, MD [place]

## 2022-11-12 NOTE — Progress Notes (Unsigned)
I,Sheila Kim,acting as a Education administrator for Sheila Paganini, MD.,have documented all relevant documentation on the behalf of Sheila Paganini, MD,as directed by  Sheila Paganini, MD while in the presence of Sheila Paganini, MD.    Annual Wellness Visit     Patient: Sheila Kim, Female    DOB: [birth year], 82 y.o.   MRN: [id number] Visit Date: 11/15/2022  Today's Provider: Lavon Paganini, MD   Chief Complaint  Patient presents with   Medicare Wellness   Subjective    Sheila Kim is a 82 y.o. female who presents today for her Annual Wellness Visit. She reports consuming a  vegetarian  diet. Gym/ health club routine includes cardio, light weights, and treadmill. She generally feels well. She reports sleeping fairly well. She does have additional problems to discuss today.   Some pain in R ear and crick in R neck x3 days. Tried heat I nthe evenings. Worse when laying down at night.  HPI   Medications: Outpatient Medications Prior to Visit  Medication Sig   acetaminophen (TYLENOL) 500 MG tablet Take 500 mg by mouth. In the PM and as needed   acetaminophen (TYLENOL) 650 MG CR tablet Take 650 mg by mouth. 2 tablets in the AM and 1 tablet in the PM   Alpha-D-Galactosidase (BEANO TO GO) TABS Take by mouth daily at 6 (six) AM. occasionally   aspirin 81 MG tablet Take 81 mg by mouth daily.    cholecalciferol (VITAMIN D) 1000 UNITS tablet Take 1,000 Units by mouth daily.    Coenzyme Q10 (COQ10) 100 MG CAPS Take 200 mg by mouth daily.    cyanocobalamin 1000 MCG tablet Take 1,000 mcg by mouth daily.    famotidine (PEPCID) 20 MG tablet Take 20 mg by mouth 2 (two) times daily.   ferrous sulfate 325 (65 FE) MG tablet Take 325 mg by mouth daily with breakfast.    fluticasone (FLONASE) 50 MCG/ACT nasal spray Place 2 sprays into both nostrils daily.   folic acid (FOLVITE) 1 MG tablet Take 1 mg by mouth daily.    ketotifen (ZADITOR) 0.025 % ophthalmic solution Place  1 drop into both eyes as needed.   loratadine (CLARITIN) 10 MG tablet Take 10 mg by mouth daily.    MELATONIN EXTRA STRENGTH PO Take 0.25 tablets by mouth at bedtime as needed.    Multiple Vitamins-Minerals ([employer] SPECTRAVITE ADULT 50+ PO) Education officer, community - Historical Medication  daily Active   Polyethyl Glycol-Propyl Glycol 0.4-0.3 % SOLN Apply to eye as needed.   polyethylene glycol (MIRALAX / GLYCOLAX) packet Take 17 g by mouth daily.   Probiotic Product ([employer]S SENIOR PROBIOTIC) CAPS Take by mouth daily.    Psyllium Husk POWD Take by mouth daily.    pyridoxine (B-6) 100 MG tablet Take 100 mg by mouth daily.    [DISCONTINUED] CALCIUM CITRATE PO Take 315 mg by mouth 2 (two) times daily.    [DISCONTINUED] COVID-19 mRNA Vac-TriS, Pfizer, (COMIRNATY) SUSP injection Inject into the muscle.   [DISCONTINUED] fenofibrate (TRICOR) 145 MG tablet Take 1 tablet (145 mg total) by mouth daily.   [DISCONTINUED] furosemide (LASIX) 40 MG tablet TAKE ONE-HALF TABLET BY MOUTH  TWICE DAILY   [DISCONTINUED] influenza vaccine adjuvanted (FLUAD QUADRIVALENT) 0.5 ML injection Inject into the muscle.   [DISCONTINUED] pantoprazole (PROTONIX) 40 MG tablet TAKE 1 TABLET BY MOUTH  TWICE DAILY   [DISCONTINUED] potassium chloride SA (KLOR-CON M) 20 MEQ tablet TAKE 1 TABLET BY MOUTH DAILY   [  DISCONTINUED] propranolol (INDERAL) 40 MG tablet TAKE ONE-HALF TABLET BY  MOUTH TWICE DAILY   [DISCONTINUED] rosuvastatin (CRESTOR) 5 MG tablet Take 1 tablet (5 mg total) by mouth daily.   No facility-administered medications prior to visit.    Allergies  Allergen Reactions   Alcohol     Gastric pain   Gelatin Capsule Empty  [Gelatin]     Stomach pains   Gemfibrozil     Stomach pains, urine retention and muscle weakness.   Meloxicam     Drastically alters taste   Nsaids Other (See Comments)    Tolerates aspirin stomach pains and reflux   Oxycodone Itching    Trembling   Simvastatin Other (See Comments)     Feet and leg pain and general body weakness.   Sulfa Antibiotics Nausea And Vomiting and Other (See Comments)   Clarithromycin Rash and Nausea And Vomiting    Terrible taste in mouth and stomach ache.   Lopressor  [Metoprolol] Palpitations   Niacin Other (See Comments) and Rash    Flushing   Ofloxacin Rash and Nausea Only    Patient Care Team: Sheila Crews, MD as PCP - General (Family Medicine) Sheila Kim, Sheila Lipps, MD as Consulting Physician (Ophthalmology) Sheila Cowman, MD as Consulting Physician (Cardiology) Sheila Sons, MD as Consulting Physician (Urology) Sheila Askew, MD as Referring Physician (Physical Medicine and Rehabilitation)  Review of Systems  HENT:  Positive for ear pain, postnasal drip and sneezing.   Eyes:  Positive for itching.  Respiratory:  Positive for cough and wheezing.   Cardiovascular:  Positive for leg swelling.  Genitourinary:  Positive for enuresis.  Musculoskeletal:  Positive for arthralgias, back pain, joint swelling, myalgias, neck pain and neck stiffness.  Neurological:  Positive for tremors.  Hematological:  Bruises/bleeds easily.  All other systems reviewed and are negative.       Objective    Vitals: BP 124/65 Comment: home readings  Pulse 64   Temp 98 F (36.7 C) (Temporal)   Resp 16   Ht '4\' 8"'$  (1.422 m)   Wt 121 lb 4.8 oz (55 kg)   SpO2 98%   BMI 27.19 kg/m  BP Readings from Last 3 Encounters:  11/15/22 124/65  05/13/22 128/73  11/12/21 128/70   Wt Readings from Last 3 Encounters:  11/15/22 121 lb 4.8 oz (55 kg)  05/13/22 120 lb 12.8 oz (54.8 kg)  11/12/21 122 lb 11.2 oz (55.7 kg)      Physical Exam Vitals reviewed.  Constitutional:      General: She is not in acute distress.    Appearance: Normal appearance. She is well-developed. She is not diaphoretic.  HENT:     Head: Normocephalic and atraumatic.     Right Ear: Tympanic membrane, ear canal and external ear normal.     Left Ear:  Tympanic membrane, ear canal and external ear normal.     Nose: Nose normal.     Mouth/Throat:     Mouth: Mucous membranes are moist.     Pharynx: Oropharynx is clear. No oropharyngeal exudate.  Eyes:     General: No scleral icterus.    Conjunctiva/sclera: Conjunctivae normal.     Pupils: Pupils are equal, round, and reactive to light.  Neck:     Thyroid: No thyromegaly.  Cardiovascular:     Rate and Rhythm: Normal rate and regular rhythm.     Heart sounds: Normal heart sounds.  Pulmonary:     Effort: Pulmonary effort is normal. No respiratory  distress.     Breath sounds: Normal breath sounds. No wheezing or rales.  Abdominal:     General: There is no distension.     Palpations: Abdomen is soft.     Tenderness: There is no abdominal tenderness.  Musculoskeletal:        General: No deformity.     Cervical back: Neck supple.     Right lower leg: No edema.     Left lower leg: No edema.     Comments: Tightness of R trapezius muscle  Lymphadenopathy:     Cervical: No cervical adenopathy.  Skin:    General: Skin is warm and dry.     Findings: No rash.  Neurological:     Mental Status: She is alert and oriented to person, place, and time. Mental status is at baseline.     Gait: Gait normal.  Psychiatric:        Mood and Affect: Mood normal.        Behavior: Behavior normal.        Thought Content: Thought content normal.    Most recent functional status assessment:    11/15/2022    1:27 PM  In your present state of health, do you have any difficulty performing the following activities:  Vision? 0  Difficulty concentrating or making decisions? 0  Walking or climbing stairs? 0  Dressing or bathing? 0  Doing errands, shopping? 0   Most recent fall risk assessment:    11/15/2022    1:27 PM  Fall Risk   Falls in the past year? 0  Number falls in past yr: 0  Injury with Fall? 0  Risk for fall due to : No Fall Risks  Follow up Falls evaluation completed    Most recent  depression screenings:    11/15/2022    1:27 PM 11/12/2021   10:22 AM  PHQ 2/9 Scores  PHQ - 2 Score 0 0  PHQ- 9 Score 1    Most recent cognitive screening:    11/15/2022    1:29 PM  6CIT Screen  What Year? 0 points  What month? 0 points  What time? 0 points  Count back from 20 0 points  Months in reverse 0 points  Repeat phrase 0 points  Total Score 0 points   Most recent Audit-C alcohol use screening    11/15/2022    1:27 PM  Alcohol Use Disorder Test (AUDIT)  1. How often do you have a drink containing alcohol? 0  2. How many drinks containing alcohol do you have on a typical day when you are drinking? 0  3. How often do you have six or more drinks on one occasion? 0  AUDIT-C Score 0   A score of 3 or more in women, and 4 or more in men indicates increased risk for alcohol abuse, EXCEPT if all of the points are from question 1   No results found for any visits on 11/15/22.  Assessment & Plan     Annual wellness visit done today including the all of the following: Reviewed patient's Family Medical History Reviewed and updated list of patient's medical providers Assessment of cognitive impairment was done Assessed patient's functional ability Established a written schedule for health screening [place] Completed and Reviewed  Exercise Activities and Dietary recommendations  Goals      DIET - REDUCE SUGAR INTAKE     Recommend cutting back and monitoring of dessert consumed in daily diet.  Immunization History  Administered Date(s) Administered   Fluad Quad(high Dose 65+) 07/24/2019, 07/24/2020, 07/15/2021, 07/23/2022   Influenza, High Dose Seasonal PF 07/26/2015, 08/06/2016, 07/21/2017, 08/03/2018   PFIZER Comirnaty(Gray Top)Covid-19 Tri-Sucrose Vaccine 01/14/2021, 07/23/2022   PFIZER(Purple Top)SARS-COV-2 Vaccination 11/14/2019, 12/05/2019, 07/08/2020   Pfizer Covid-19 Vaccine Bivalent Booster 48yr & up 06/22/2021, 02/23/2022    Pneumococcal Conjugate-13 07/16/2014   Pneumococcal Polysaccharide-23 03/16/1999, 08/21/2005   Rsv, Bivalent, Protein Subunit Rsvpref,pf (Evans Lance 08/06/2022   Tdap 07/01/2011, 11/20/2021   Zoster Recombinat (Shingrix) 09/25/2018, 11/27/2018   Zoster, Live 10/01/2011    Health Maintenance  Topic Date Due   COVID-19 Vaccine (8 - 2023-24 season) 09/17/2022   DEXA SCAN  09/20/2028 (Originally 04/10/2011)   Medicare Annual Wellness (AWV)  11/16/2023   DTaP/Tdap/Td (3 - Td or Tdap) 11/21/2031   Pneumonia Vaccine 82 Years old  Completed   INFLUENZA VACCINE  Completed   Zoster Vaccines- Shingrix  Completed   HPV VACCINES  Aged Out     Discussed health benefits of physical activity, and encouraged her to engage in regular exercise appropriate for her age and condition.    Problem List Items Addressed This Visit       Cardiovascular and Mediastinum   BP (high blood pressure)    Well controlled on home readings Continue current medications Reviewed metabolic panel F/u in 6 months       Relevant Medications   propranolol (INDERAL) 40 MG tablet   furosemide (LASIX) 40 MG tablet   rosuvastatin (CRESTOR) 5 MG tablet   Other Relevant Orders   Hepatic function panel     Respiratory   Gastroesophageal reflux disease with hiatal hernia    Refilled PPI      Relevant Medications   pantoprazole (PROTONIX) 40 MG tablet     Musculoskeletal and Integument   Neck muscle spasm    New problem No red flags on exam Encourage heat, massage, rest, gentle ROM        Genitourinary   CKD (chronic kidney disease)    F/b nephrology Reviewed metabolic panel        Other   Hypercholesteremia    Has previously failed multiple statins and zetia Mildly elevated triglycerides, but does not need fenofibrates Previously stable Continue statin - Crestor '5mg'$  5 times weekly Repeat FLP and LFTs      Relevant Medications   propranolol (INDERAL) 40 MG tablet   furosemide (LASIX) 40 MG tablet    rosuvastatin (CRESTOR) 5 MG tablet   Other Relevant Orders   Lipid panel   Hepatic function panel   Prediabetes    Recommend low carb diet Recheck A1c       Relevant Orders   Hemoglobin A1c   Other Visit Diagnoses     Encounter for annual wellness visit (AWV) in Medicare patient    -  Primary   Encounter for annual physical exam       Essential hypertension       Relevant Medications   propranolol (INDERAL) 40 MG tablet   furosemide (LASIX) 40 MG tablet   rosuvastatin (CRESTOR) 5 MG tablet        Return in about 6 months (around 05/16/2023) for chronic disease f/u.     I, ALavon Paganini MD, have reviewed all documentation for this visit. The documentation on 11/15/22 for the exam, diagnosis, procedures, and orders are all accurate and complete.   Bacigalupo, ADionne Bucy MD, MPH [email or web address]

## 2022-11-15 ENCOUNTER — Ambulatory Visit (INDEPENDENT_AMBULATORY_CARE_PROVIDER_SITE_OTHER): Payer: Medicare Other | Admitting: Family Medicine

## 2022-11-15 ENCOUNTER — Encounter: Payer: Self-pay | Admitting: Family Medicine

## 2022-11-15 VITALS — BP 124/65 | HR 64 | Temp 98.0°F | Resp 16 | Ht <= 58 in | Wt 121.3 lb

## 2022-11-15 DIAGNOSIS — E7800 Pure hypercholesterolemia, unspecified: Secondary | ICD-10-CM

## 2022-11-15 DIAGNOSIS — K449 Diaphragmatic hernia without obstruction or gangrene: Secondary | ICD-10-CM | POA: Diagnosis not present

## 2022-11-15 DIAGNOSIS — N1832 Chronic kidney disease, stage 3b: Secondary | ICD-10-CM

## 2022-11-15 DIAGNOSIS — M62838 Other muscle spasm: Secondary | ICD-10-CM | POA: Diagnosis not present

## 2022-11-15 DIAGNOSIS — Z0000 Encounter for general adult medical examination without abnormal findings: Secondary | ICD-10-CM | POA: Diagnosis not present

## 2022-11-15 DIAGNOSIS — K219 Gastro-esophageal reflux disease without esophagitis: Secondary | ICD-10-CM

## 2022-11-15 DIAGNOSIS — I10 Essential (primary) hypertension: Secondary | ICD-10-CM | POA: Diagnosis not present

## 2022-11-15 DIAGNOSIS — R7303 Prediabetes: Secondary | ICD-10-CM

## 2022-11-15 MED ORDER — FUROSEMIDE 40 MG PO TABS: 20.0000 mg | ORAL_TABLET | Freq: Two times a day (BID) | ORAL | 3 refills | Status: DC

## 2022-11-15 MED ORDER — PANTOPRAZOLE SODIUM 40 MG PO TBEC: 40.0000 mg | DELAYED_RELEASE_TABLET | Freq: Two times a day (BID) | ORAL | 3 refills | Status: DC

## 2022-11-15 MED ORDER — POTASSIUM CHLORIDE CRYS ER 20 MEQ PO TBCR: 20.0000 meq | EXTENDED_RELEASE_TABLET | Freq: Every day | ORAL | 3 refills | Status: DC

## 2022-11-15 MED ORDER — PROPRANOLOL HCL 40 MG PO TABS: 20.0000 mg | ORAL_TABLET | Freq: Two times a day (BID) | ORAL | 3 refills | Status: DC

## 2022-11-15 MED ORDER — ROSUVASTATIN CALCIUM 5 MG PO TABS: ORAL_TABLET | ORAL | 3 refills | Status: DC

## 2022-11-15 NOTE — Assessment & Plan Note (Signed)
F/b nephrology Reviewed metabolic panel

## 2022-11-15 NOTE — Assessment & Plan Note (Signed)
New problem No red flags on exam Encourage heat, massage, rest, gentle ROM

## 2022-11-15 NOTE — Assessment & Plan Note (Signed)
Has previously failed multiple statins and zetia Mildly elevated triglycerides, but does not need fenofibrates Previously stable Continue statin - Crestor '5mg'$  5 times weekly Repeat FLP and LFTs

## 2022-11-15 NOTE — Assessment & Plan Note (Signed)
Refilled PPI 

## 2022-11-15 NOTE — Assessment & Plan Note (Signed)
Recommend low carb diet °Recheck A1c  °

## 2022-11-15 NOTE — Assessment & Plan Note (Signed)
Well controlled on home readings Continue current medications Reviewed metabolic panel F/u in 6 months

## 2022-11-16 LAB — HEPATIC FUNCTION PANEL
ALT: 14 IU/L (ref 0–32)
AST: 21 IU/L (ref 0–40)
Albumin: 4.5 g/dL (ref 3.7–4.7)
Alkaline Phosphatase: 58 IU/L (ref 44–121)
Bilirubin Total: 0.9 mg/dL (ref 0.0–1.2)
Bilirubin, Direct: 0.21 mg/dL (ref 0.00–0.40)
Total Protein: 6.8 g/dL (ref 6.0–8.5)

## 2022-11-16 LAB — LIPID PANEL
Chol/HDL Ratio: 4.4 ratio (ref 0.0–4.4)
Cholesterol, Total: 224 mg/dL — ABNORMAL HIGH (ref 100–199)
HDL: 51 mg/dL (ref >39)
LDL Chol Calc (NIH): 135 mg/dL — ABNORMAL HIGH (ref 0–99)
Triglycerides: 214 mg/dL — ABNORMAL HIGH (ref 0–149)
VLDL Cholesterol Cal: 38 mg/dL (ref 5–40)

## 2022-11-16 LAB — HEMOGLOBIN A1C
Est. average glucose Bld gHb Est-mCnc: 128 mg/dL
Hgb A1c MFr Bld: 6.1 % — ABNORMAL HIGH (ref 4.8–5.6)

## 2022-12-01 DIAGNOSIS — I21A1 Myocardial infarction type 2: Secondary | ICD-10-CM | POA: Diagnosis not present

## 2022-12-01 DIAGNOSIS — Z951 Presence of aortocoronary bypass graft: Secondary | ICD-10-CM | POA: Diagnosis not present

## 2022-12-01 DIAGNOSIS — I213 ST elevation (STEMI) myocardial infarction of unspecified site: Secondary | ICD-10-CM | POA: Diagnosis not present

## 2022-12-01 DIAGNOSIS — I2510 Atherosclerotic heart disease of native coronary artery without angina pectoris: Secondary | ICD-10-CM | POA: Diagnosis not present

## 2022-12-01 DIAGNOSIS — I130 Hypertensive heart and chronic kidney disease with heart failure and stage 1 through stage 4 chronic kidney disease, or unspecified chronic kidney disease: Secondary | ICD-10-CM | POA: Diagnosis not present

## 2022-12-01 DIAGNOSIS — I10 Essential (primary) hypertension: Secondary | ICD-10-CM | POA: Diagnosis not present

## 2022-12-16 ENCOUNTER — Other Ambulatory Visit: Payer: Self-pay

## 2022-12-24 ENCOUNTER — Other Ambulatory Visit: Payer: Self-pay

## 2022-12-24 MED ORDER — COMIRNATY 30 MCG/0.3ML IM SUSY
0.3000 mL | PREFILLED_SYRINGE | Freq: Once | INTRAMUSCULAR | 0 refills | Status: AC
  Filled 2022-12-24: qty 0.3, 1d supply, fill #0

## 2022-12-29 ENCOUNTER — Other Ambulatory Visit (HOSPITAL_BASED_OUTPATIENT_CLINIC_OR_DEPARTMENT_OTHER): Payer: Self-pay

## 2023-01-03 ENCOUNTER — Other Ambulatory Visit: Payer: Self-pay

## 2023-03-25 ENCOUNTER — Other Ambulatory Visit: Payer: Self-pay | Admitting: Family Medicine

## 2023-03-25 DIAGNOSIS — Z1231 Encounter for screening mammogram for malignant neoplasm of breast: Secondary | ICD-10-CM

## 2023-04-15 ENCOUNTER — Ambulatory Visit
Admission: RE | Admit: 2023-04-15 | Discharge: 2023-04-15 | Disposition: A | Discharge status: Home / Self Care | Payer: Medicare Other | Source: Ambulatory Visit | Attending: Family Medicine | Admitting: Family Medicine

## 2023-04-15 DIAGNOSIS — Z1231 Encounter for screening mammogram for malignant neoplasm of breast: Secondary | ICD-10-CM | POA: Insufficient documentation

## 2023-04-21 ENCOUNTER — Ambulatory Visit: Payer: Medicare Other | Admitting: Dermatology

## 2023-04-21 VITALS — BP 138/76 | HR 77

## 2023-04-21 DIAGNOSIS — L578 Other skin changes due to chronic exposure to nonionizing radiation: Secondary | ICD-10-CM | POA: Diagnosis not present

## 2023-04-21 DIAGNOSIS — D1801 Hemangioma of skin and subcutaneous tissue: Secondary | ICD-10-CM

## 2023-04-21 DIAGNOSIS — L821 Other seborrheic keratosis: Secondary | ICD-10-CM

## 2023-04-21 DIAGNOSIS — Z1283 Encounter for screening for malignant neoplasm of skin: Secondary | ICD-10-CM | POA: Diagnosis not present

## 2023-04-21 DIAGNOSIS — W908XXA Exposure to other nonionizing radiation, initial encounter: Secondary | ICD-10-CM | POA: Diagnosis not present

## 2023-04-21 DIAGNOSIS — L814 Other melanin hyperpigmentation: Secondary | ICD-10-CM

## 2023-04-21 DIAGNOSIS — L729 Follicular cyst of the skin and subcutaneous tissue, unspecified: Secondary | ICD-10-CM

## 2023-04-21 DIAGNOSIS — D229 Melanocytic nevi, unspecified: Secondary | ICD-10-CM

## 2023-04-21 DIAGNOSIS — L570 Actinic keratosis: Secondary | ICD-10-CM | POA: Diagnosis not present

## 2023-04-21 NOTE — Patient Instructions (Addendum)
Cryotherapy Aftercare  Wash gently with soap and water everyday.   Apply Vaseline and Band-Aid daily until healed.     Due to recent changes in healthcare laws, you may see results of your pathology and/or laboratory studies on MyChart before the doctors have had a chance to review them. We understand that in some cases there may be results that are confusing or concerning to you. Please understand that not all results are received at the same time and often the doctors may need to interpret multiple results in order to provide you with the best plan of care or course of treatment. Therefore, we ask that you please give us 2 business days to thoroughly review all your results before contacting the office for clarification. Should we see a critical lab result, you will be contacted sooner.   If You Need Anything After Your Visit  If you have any questions or concerns for your doctor, please call our main line at [phone] and press option 4 to reach your doctor's medical assistant. If no one answers, please leave a voicemail as directed and we will return your call as soon as possible. Messages left after 4 pm will be answered the following business day.   You may also send us a message via MyChart. We typically respond to MyChart messages within 1-2 business days.  For prescription refills, please ask your pharmacy to contact our office. Our fax number is [phone].  If you have an urgent issue when the clinic is closed that cannot wait until the next business day, you can page your doctor at the number below.    Please note that while we do our best to be available for urgent issues outside of office hours, we are not available 24/7.   If you have an urgent issue and are unable to reach us, you may choose to seek medical care at your doctor's office, retail clinic, urgent care center, or emergency room.  If you have a medical emergency, please immediately call 911 or go to the  emergency department.  Pager Numbers  - Dr. Kowalski: [phone]  - Dr. Moye: [phone]  - Dr. Stewart: [phone]  In the event of inclement weather, please call our main line at [phone] for an update on the status of any delays or closures.  Dermatology Medication Tips: Please keep the boxes that topical medications come in in order to help keep track of the instructions about where and how to use these. Pharmacies typically print the medication instructions only on the boxes and not directly on the medication tubes.   If your medication is too expensive, please contact our office at [phone] option 4 or send us a message through MyChart.   We are unable to tell what your co-pay for medications will be in advance as this is different depending on your insurance coverage. However, we may be able to find a substitute medication at lower cost or fill out paperwork to get insurance to cover a needed medication.   If a prior authorization is required to get your medication covered by your insurance company, please allow us 1-2 business days to complete this process.  Drug prices often vary depending on where the prescription is filled and some pharmacies may offer cheaper prices.  The website www.goodrx.com contains coupons for medications through different pharmacies. The prices here do not account for what the cost may be with help from insurance (it may be cheaper with your insurance), but the website can   give you the price if you did not use any insurance.  - You can print the associated coupon and take it with your prescription to the pharmacy.  - You may also stop by our office during regular business hours and pick up a GoodRx coupon card.  - If you need your prescription sent electronically to a different pharmacy, notify our office through  MyChart or by phone at [phone] option 4.     Si Usted Necesita Algo Despus de Su Visita  Tambin puede  enviarnos un mensaje a travs de MyChart. Por lo general respondemos a los mensajes de MyChart en el transcurso de 1 a 2 das hbiles.  Para renovar recetas, por favor pida a su farmacia que se ponga en contacto con nuestra oficina. Nuestro nmero de fax es el [phone].  Si tiene un asunto urgente cuando la clnica est cerrada y que no puede esperar hasta el siguiente da hbil, puede llamar/localizar a su doctor(a) al nmero que aparece a continuacin.   Por favor, tenga en cuenta que aunque hacemos todo lo posible para estar disponibles para asuntos urgentes fuera del horario de oficina, no estamos disponibles las 24 horas del da, los 7 das de la semana.   Si tiene un problema urgente y no puede comunicarse con nosotros, puede optar por buscar atencin mdica  en el consultorio de su doctor(a), en una clnica privada, en un centro de atencin urgente o en una sala de emergencias.  Si tiene una emergencia mdica, por favor llame inmediatamente al 911 o vaya a la sala de emergencias.  Nmeros de bper  - Dr. Kowalski: [phone]  - Dra. Moye: [phone]  - Dra. Stewart: [phone]  En caso de inclemencias del tiempo, por favor llame a nuestra lnea principal al [phone] para una actualizacin sobre el estado de cualquier retraso o cierre.  Consejos para la medicacin en dermatologa: Por favor, guarde las cajas en las que vienen los medicamentos de uso tpico para ayudarle a seguir las instrucciones sobre dnde y cmo usarlos. Las farmacias generalmente imprimen las instrucciones del medicamento slo en las cajas y no directamente en los tubos del medicamento.   Si su medicamento es muy caro, por favor, pngase en contacto con nuestra oficina llamando al [phone] y presione la opcin 4 o envenos un mensaje a travs de MyChart.   No podemos decirle cul ser su copago por los medicamentos por adelantado ya que esto es diferente dependiendo de la cobertura de su seguro.  Sin embargo, es posible que podamos encontrar un medicamento sustituto a menor costo o llenar un formulario para que el seguro cubra el medicamento que se considera necesario.   Si se requiere una autorizacin previa para que su compaa de seguros cubra su medicamento, por favor permtanos de 1 a 2 das hbiles para completar este proceso.  Los precios de los medicamentos varan con frecuencia dependiendo del lugar de dnde se surte la receta y alguna farmacias pueden ofrecer precios ms baratos.  El sitio web www.goodrx.com tiene cupones para medicamentos de diferentes farmacias. Los precios aqu no tienen en cuenta lo que podra costar con la ayuda del seguro (puede ser ms barato con su seguro), pero el sitio web puede darle el precio si no utiliz ningn seguro.  - Puede imprimir el cupn correspondiente y llevarlo con su receta a la farmacia.  - Tambin puede pasar por nuestra oficina durante el horario de atencin regular y recoger una tarjeta de cupones de GoodRx.  -   Si necesita que su receta se enve electrnicamente a una farmacia diferente, informe a nuestra oficina a travs de MyChart de [facility] o por telfono llamando al [phone] y presione la opcin 4.  

## 2023-04-21 NOTE — Progress Notes (Signed)
Follow-Up Visit   Subjective  Sheila Kim is a 82 y.o. female who presents for the following: Skin Cancer Screening and Full Body Skin Exam The patient presents for Total-Body Skin Exam (TBSE) for skin cancer screening and mole check. The patient has spots, moles and lesions to be evaluated, some may be new or changing and the patient may have concern these could be cancer.  The following portions of the chart were reviewed this encounter and updated as appropriate: medications, allergies, medical history  Review of Systems:  No other skin or systemic complaints except as noted in HPI or Assessment and Plan.  Objective  Well appearing patient in no apparent distress; mood and affect are within normal limits.  A full examination was performed including scalp, head, eyes, ears, nose, lips, neck, chest, axillae, abdomen, back, buttocks, bilateral upper extremities, bilateral lower extremities, hands, feet, fingers, toes, fingernails, and toenails. All findings within normal limits unless otherwise noted below.   Relevant physical exam findings are noted in the Assessment and Plan.  right cheek x 2 (2) Erythematous thin papules/macules with gritty scale.    Assessment & Plan   SKIN CANCER SCREENING PERFORMED TODAY.  LENTIGINES, SEBORRHEIC KERATOSES, HEMANGIOMAS - Benign normal skin lesions - Benign-appearing - Call for any changes  MELANOCYTIC NEVI - Tan-brown and/or pink-flesh-colored symmetric macules and papules - Benign appearing on exam today - Observation - Call clinic for new or changing moles - Recommend daily use of broad spectrum spf 30+ sunscreen to sun-exposed areas.   Milia vs Syringomas face - tiny firm white papules - type of cyst - benign - may be extracted if symptomatic - observe    AK (actinic keratosis) (2) right cheek x 2  Actinic keratoses are precancerous spots that appear secondary to cumulative UV radiation exposure/sun exposure over  time. They are chronic with expected duration over 1 year. A portion of actinic keratoses will progress to squamous cell carcinoma of the skin. It is not possible to reliably predict which spots will progress to skin cancer and so treatment is recommended to prevent development of skin cancer.  Recommend daily broad spectrum sunscreen SPF 30+ to sun-exposed areas, reapply every 2 hours as needed.  Recommend staying in the shade or wearing long sleeves, sun glasses (UVA+UVB protection) and wide brim hats (4-inch brim around the entire circumference of the hat). Call for new or changing lesions.    ACTINIC DAMAGE - chronic, secondary to cumulative UV radiation exposure/sun exposure over time - diffuse scaly erythematous macules with underlying dyspigmentation - Recommend daily broad spectrum sunscreen SPF 30+ to sun-exposed areas, reapply every 2 hours as needed.  - Recommend staying in the shade or wearing long sleeves, sun glasses (UVA+UVB protection) and wide brim hats (4-inch brim around the entire circumference of the hat). - Call for new or changing lesions.   Destruction of lesion - right cheek x 2 (2) Complexity: simple   Destruction method: cryotherapy   Informed consent: discussed and consent obtained   Timeout:  patient name, date of birth, surgical site, and procedure verified Lesion destroyed using liquid nitrogen: Yes   Region frozen until ice ball extended beyond lesion: Yes   Outcome: patient tolerated procedure well with no complications   Post-procedure details: wound care instructions given    Actinic skin damage  Skin cancer screening  Lentigo  Melanocytic nevus, unspecified location  Cyst of skin   Return in about 1 year (around 04/20/2024) for TBSE, hx of AKs, ISKs .  IAngelique Holm, CMA, am acting as scribe for Armida Sans, MD .   Documentation: I have reviewed the above documentation for accuracy and completeness, and I agree with the above.  Armida Sans, MD

## 2023-04-22 ENCOUNTER — Encounter: Payer: Self-pay | Admitting: Dermatology

## 2023-05-16 ENCOUNTER — Ambulatory Visit (INDEPENDENT_AMBULATORY_CARE_PROVIDER_SITE_OTHER): Payer: Medicare Other | Admitting: Family Medicine

## 2023-05-16 ENCOUNTER — Encounter: Payer: Self-pay | Admitting: Family Medicine

## 2023-05-16 VITALS — BP 130/80 | HR 62 | Temp 98.0°F | Resp 12 | Ht <= 58 in | Wt 117.1 lb

## 2023-05-16 DIAGNOSIS — I10 Essential (primary) hypertension: Secondary | ICD-10-CM

## 2023-05-16 DIAGNOSIS — R7303 Prediabetes: Secondary | ICD-10-CM

## 2023-05-16 DIAGNOSIS — E559 Vitamin D deficiency, unspecified: Secondary | ICD-10-CM

## 2023-05-16 DIAGNOSIS — I2510 Atherosclerotic heart disease of native coronary artery without angina pectoris: Secondary | ICD-10-CM

## 2023-05-16 DIAGNOSIS — M79644 Pain in right finger(s): Secondary | ICD-10-CM | POA: Diagnosis not present

## 2023-05-16 DIAGNOSIS — E7800 Pure hypercholesterolemia, unspecified: Secondary | ICD-10-CM | POA: Diagnosis not present

## 2023-05-16 DIAGNOSIS — I130 Hypertensive heart and chronic kidney disease with heart failure and stage 1 through stage 4 chronic kidney disease, or unspecified chronic kidney disease: Secondary | ICD-10-CM

## 2023-05-16 NOTE — Assessment & Plan Note (Signed)
Has previously failed multiple statins and zetia Mildly elevated triglycerides, but does not need fenofibrates Previously stable Continue statin - Crestor '5mg'$  5 times weekly Repeat FLP and LFTs

## 2023-05-16 NOTE — Assessment & Plan Note (Signed)
Followed by Cardiology S/p CABG Needs strict BP and cholesterol control Recheck lipid panel

## 2023-05-16 NOTE — Progress Notes (Signed)
Established Patient Office Visit  Subjective   Patient ID: Sheila Kim, female    DOB: [birth year]  Age: 82 y.o. MRN: [id number]  Chief Complaint  Patient presents with   Medical Management of Chronic Issues    She reports good compliance and tolerance of medications. She is checking her BP at home. She reports her readings stable in the lower side. She reports BP can go as low as 101/60. She denies any chest pain, shortness of breath, headaches, or dizziness.     HPI Discussed the use of AI scribe software for clinical note transcription with the patient, who gave verbal consent to proceed.  History of Present Illness   The patient presents with a sharp pain in the right index finger, specifically at the proximal interphalangeal (PIP) joint. The pain radiates and causes numbness. They suspect it might be due to arthritis or a nerve injury. The patient is left-handed, which is fortunate given the current discomfort in the right hand. They have a small nodule at the affected joint, similar to one on the left index finger. The patient believes the pain might be due to a nerve being pinched by the inflammation from arthritis. The pain is particularly noticeable when pulling a shower knob or when the finger is accidentally hit against something.  In addition to the finger pain, the patient has been monitoring their blood pressure at home, which tends to be lower than the readings at the doctor's office. The readings at home are usually in the teens over 60s. The patient is on lasix and propranolol, which have been halved due to previous episodes of weakness when the blood pressure dropped to around 90.         ROS    Objective:     BP 130/80 (BP Location: Left Arm, Patient Position: Sitting, Cuff Size: Large)   Pulse 62   Temp 98 F (36.7 C) (Temporal)   Resp 12   Ht 4\' 8"  (1.422 m)   Wt 117 lb 1.6 oz (53.1 kg)   SpO2 99%   BMI 26.25 kg/m    Physical Exam Vitals  reviewed.  Constitutional:      General: She is not in acute distress.    Appearance: Normal appearance. She is well-developed. She is not diaphoretic.  HENT:     Head: Normocephalic and atraumatic.  Eyes:     General: No scleral icterus.    Conjunctiva/sclera: Conjunctivae normal.  Neck:     Thyroid: No thyromegaly.  Cardiovascular:     Rate and Rhythm: Normal rate and regular rhythm.     Heart sounds: Murmur heard.  Pulmonary:     Effort: Pulmonary effort is normal. No respiratory distress.     Breath sounds: Normal breath sounds. No wheezing, rhonchi or rales.  Musculoskeletal:     Cervical back: Neck supple.     Right lower leg: No edema.     Left lower leg: No edema.  Lymphadenopathy:     Cervical: No cervical adenopathy.  Skin:    General: Skin is warm and dry.     Findings: No rash.  Neurological:     Mental Status: She is alert and oriented to person, place, and time. Mental status is at baseline.  Psychiatric:        Mood and Affect: Mood normal.        Behavior: Behavior normal.    Physical Exam    MUSCULOSKELETAL: Right index finger PIP joint with arthritis deformity  and nodule.       No results found for any visits on 05/16/23.    The ASCVD Risk score (Arnett DK, et al., 2019) failed to calculate for the following reasons:   The 2019 ASCVD risk score is only valid for ages 10 to 4   The patient has a prior MI or stroke diagnosis    Assessment & Plan:   Problem List Items Addressed This Visit       Cardiovascular and Mediastinum   Atherosclerosis of coronary artery    Followed by Cardiology S/p CABG Needs strict BP and cholesterol control Recheck lipid panel      BP (high blood pressure) - Primary   Relevant Orders   Comprehensive metabolic panel   Heart & renal disease, hypertensive, 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HCC)    Risk factor control as above F/b Cardiology        Other   Hypercholesteremia    Has previously failed multiple statins and  zetia Mildly elevated triglycerides, but does not need fenofibrates Previously stable Continue statin - Crestor 5mg  5 times weekly Repeat FLP and LFTs      Relevant Orders   Comprehensive metabolic panel   Lipid panel   Prediabetes    Recommend low carb diet Recheck A1c       Relevant Orders   Hemoglobin A1c   Avitaminosis D    Continue supplement Recheck vitamin D level      Relevant Orders   VITAMIN D 25 Hydroxy (Vit-D Deficiency, Fractures)   Other Visit Diagnoses     Finger pain, right               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 Noted arthritis with a small nodule at the joint. Likely nerve entrapment due to inflammation from arthritis. -Apply Voltaren gel a couple of times a day to decrease inflammation and potentially relieve nerve entrapment.  Hypertension Blood pressure well-controlled in the office, but patient reports lower readings at home. No changes in medication advised at this time. -Continue current medications, including Crestor 5mg  five times a week. -Report any symptoms of dizziness or lightheadedness, especially upon standing.  Vitamin D deficiency Patient has a history of vitamin D deficiency but has been supplementing. It has been a couple of years since the last check. -Check vitamin D levels in today's labs.  General Health Maintenance -Continue current medications, including Crestor 5mg  five times a week. -Check kidney and liver function, A1C, and cholesterol in today's labs. -Schedule physical for on or after February 6th, 2025.        Return in about 6 months (around 11/17/2023) for CPE, AWV.    Shirlee Latch, MD

## 2023-05-16 NOTE — Assessment & Plan Note (Signed)
Continue supplement Recheck vitamin D level 

## 2023-05-16 NOTE — Assessment & Plan Note (Signed)
Recommend low carb diet °Recheck A1c  °

## 2023-05-16 NOTE — Assessment & Plan Note (Signed)
Risk factor control as above F/b Cardiology

## 2023-05-25 ENCOUNTER — Other Ambulatory Visit: Payer: Self-pay

## 2023-05-25 ENCOUNTER — Encounter: Payer: Self-pay | Admitting: *Deleted

## 2023-05-25 ENCOUNTER — Encounter
Admission: EM | Disposition: A | Discharge status: Home / Self Care | Payer: Self-pay | Source: Home / Self Care | Attending: Student in an Organized Health Care Education/Training Program

## 2023-05-25 ENCOUNTER — Inpatient Hospital Stay
Admission: EM | Admit: 2023-05-25 | Discharge: 2023-05-29 | DRG: 229 | Disposition: A | Discharge status: Home / Self Care | Payer: Medicare Other | Attending: Student in an Organized Health Care Education/Training Program | Admitting: Student in an Organized Health Care Education/Training Program

## 2023-05-25 DIAGNOSIS — I252 Old myocardial infarction: Secondary | ICD-10-CM

## 2023-05-25 DIAGNOSIS — E871 Hypo-osmolality and hyponatremia: Secondary | ICD-10-CM | POA: Diagnosis present

## 2023-05-25 DIAGNOSIS — Z79899 Other long term (current) drug therapy: Secondary | ICD-10-CM

## 2023-05-25 DIAGNOSIS — Z8249 Family history of ischemic heart disease and other diseases of the circulatory system: Secondary | ICD-10-CM

## 2023-05-25 DIAGNOSIS — R001 Bradycardia, unspecified: Secondary | ICD-10-CM | POA: Diagnosis not present

## 2023-05-25 DIAGNOSIS — I129 Hypertensive chronic kidney disease with stage 1 through stage 4 chronic kidney disease, or unspecified chronic kidney disease: Secondary | ICD-10-CM | POA: Diagnosis present

## 2023-05-25 DIAGNOSIS — I442 Atrioventricular block, complete: Principal | ICD-10-CM | POA: Diagnosis present

## 2023-05-25 DIAGNOSIS — R55 Syncope and collapse: Secondary | ICD-10-CM | POA: Diagnosis present

## 2023-05-25 DIAGNOSIS — R531 Weakness: Secondary | ICD-10-CM | POA: Diagnosis not present

## 2023-05-25 DIAGNOSIS — E1165 Type 2 diabetes mellitus with hyperglycemia: Secondary | ICD-10-CM | POA: Diagnosis present

## 2023-05-25 DIAGNOSIS — M159 Polyosteoarthritis, unspecified: Secondary | ICD-10-CM | POA: Diagnosis present

## 2023-05-25 DIAGNOSIS — N179 Acute kidney failure, unspecified: Secondary | ICD-10-CM | POA: Diagnosis present

## 2023-05-25 DIAGNOSIS — N182 Chronic kidney disease, stage 2 (mild): Secondary | ICD-10-CM | POA: Diagnosis present

## 2023-05-25 DIAGNOSIS — R0902 Hypoxemia: Secondary | ICD-10-CM | POA: Diagnosis not present

## 2023-05-25 DIAGNOSIS — Z006 Encounter for examination for normal comparison and control in clinical research program: Secondary | ICD-10-CM

## 2023-05-25 DIAGNOSIS — I2510 Atherosclerotic heart disease of native coronary artery without angina pectoris: Secondary | ICD-10-CM | POA: Diagnosis present

## 2023-05-25 DIAGNOSIS — Z950 Presence of cardiac pacemaker: Secondary | ICD-10-CM

## 2023-05-25 DIAGNOSIS — I10 Essential (primary) hypertension: Secondary | ICD-10-CM | POA: Diagnosis not present

## 2023-05-25 DIAGNOSIS — Z7982 Long term (current) use of aspirin: Secondary | ICD-10-CM

## 2023-05-25 DIAGNOSIS — I952 Hypotension due to drugs: Secondary | ICD-10-CM | POA: Diagnosis not present

## 2023-05-25 DIAGNOSIS — E785 Hyperlipidemia, unspecified: Secondary | ICD-10-CM | POA: Diagnosis present

## 2023-05-25 DIAGNOSIS — K219 Gastro-esophageal reflux disease without esophagitis: Secondary | ICD-10-CM | POA: Diagnosis present

## 2023-05-25 DIAGNOSIS — E1122 Type 2 diabetes mellitus with diabetic chronic kidney disease: Secondary | ICD-10-CM | POA: Diagnosis present

## 2023-05-25 DIAGNOSIS — D72829 Elevated white blood cell count, unspecified: Secondary | ICD-10-CM | POA: Diagnosis present

## 2023-05-25 DIAGNOSIS — Z833 Family history of diabetes mellitus: Secondary | ICD-10-CM

## 2023-05-25 DIAGNOSIS — E872 Acidosis, unspecified: Secondary | ICD-10-CM | POA: Diagnosis present

## 2023-05-25 DIAGNOSIS — J302 Other seasonal allergic rhinitis: Secondary | ICD-10-CM | POA: Diagnosis present

## 2023-05-25 DIAGNOSIS — Z90710 Acquired absence of both cervix and uterus: Secondary | ICD-10-CM

## 2023-05-25 DIAGNOSIS — Z951 Presence of aortocoronary bypass graft: Secondary | ICD-10-CM

## 2023-05-25 HISTORY — PX: TEMPORARY PACEMAKER: CATH118268

## 2023-05-25 LAB — CBC
HCT: 46.1 % — ABNORMAL HIGH (ref 36.0–46.0)
Hemoglobin: 15.1 g/dL — ABNORMAL HIGH (ref 12.0–15.0)
MCH: 32.4 pg (ref 26.0–34.0)
MCHC: 32.8 g/dL (ref 30.0–36.0)
MCV: 98.9 fL (ref 80.0–100.0)
Platelets: 320 10*3/uL (ref 150–400)
RBC: 4.66 MIL/uL (ref 3.87–5.11)
RDW: 12.8 % (ref 11.5–15.5)
WBC: 11.4 10*3/uL — ABNORMAL HIGH (ref 4.0–10.5)
nRBC: 0 % (ref 0.0–0.2)

## 2023-05-25 LAB — MAGNESIUM: Magnesium: 2.6 mg/dL — ABNORMAL HIGH (ref 1.7–2.4)

## 2023-05-25 LAB — BASIC METABOLIC PANEL
Anion gap: 16 — ABNORMAL HIGH (ref 5–15)
BUN: 30 mg/dL — ABNORMAL HIGH (ref 8–23)
CO2: 20 mmol/L — ABNORMAL LOW (ref 22–32)
Calcium: 9.7 mg/dL (ref 8.9–10.3)
Chloride: 98 mmol/L (ref 98–111)
Creatinine, Ser: 1.85 mg/dL — ABNORMAL HIGH (ref 0.44–1.00)
GFR, Estimated: 27 mL/min — ABNORMAL LOW (ref >60)
Glucose, Bld: 238 mg/dL — ABNORMAL HIGH (ref 70–99)
Potassium: 4.9 mmol/L (ref 3.5–5.1)
Sodium: 134 mmol/L — ABNORMAL LOW (ref 135–145)

## 2023-05-25 SURGERY — TEMPORARY PACEMAKER

## 2023-05-25 MED ORDER — FENTANYL CITRATE PF 50 MCG/ML IJ SOSY
PREFILLED_SYRINGE | INTRAMUSCULAR | Status: AC
  Administered 2023-05-25: 50 ug via INTRAVENOUS
  Filled 2023-05-25: qty 1

## 2023-05-25 MED ORDER — NOREPINEPHRINE 4 MG/250ML-% IV SOLN: 0.0000 ug/min | INTRAVENOUS | Status: DC

## 2023-05-25 MED ORDER — MIDAZOLAM HCL 2 MG/2ML IJ SOLN
2.0000 mg | Freq: Once | INTRAMUSCULAR | Status: DC
  Filled 2023-05-25: qty 2

## 2023-05-25 MED ORDER — FENTANYL CITRATE PF 50 MCG/ML IJ SOSY
PREFILLED_SYRINGE | INTRAMUSCULAR | Status: AC
  Filled 2023-05-25: qty 1

## 2023-05-25 MED ORDER — FENTANYL CITRATE PF 50 MCG/ML IJ SOSY: 50.0000 ug | PREFILLED_SYRINGE | Freq: Once | INTRAMUSCULAR | Status: AC

## 2023-05-25 MED ORDER — NOREPINEPHRINE 4 MG/250ML-% IV SOLN
INTRAVENOUS | Status: AC
  Filled 2023-05-25: qty 250

## 2023-05-25 MED ORDER — FENTANYL CITRATE PF 50 MCG/ML IJ SOSY
50.0000 ug | PREFILLED_SYRINGE | Freq: Once | INTRAMUSCULAR | Status: AC
  Administered 2023-05-25: 50 ug via INTRAVENOUS
  Filled 2023-05-25: qty 1

## 2023-05-25 MED ORDER — FENTANYL CITRATE PF 50 MCG/ML IJ SOSY
50.0000 ug | PREFILLED_SYRINGE | Freq: Once | INTRAMUSCULAR | Status: AC
  Administered 2023-05-25: 50 ug via INTRAVENOUS

## 2023-05-25 SURGICAL SUPPLY — 16 items
CABLE ADAPT PACING TEMP 12FT (ADAPTER) IMPLANT
CATH S G BIP PACING (CATHETERS) IMPLANT
DRAPE BRACHIAL (DRAPES) IMPLANT
GLIDESHEATH SLEND SS 6F .021 (SHEATH) IMPLANT
GUIDEWIRE INQWIRE 1.5J.035X260 (WIRE) IMPLANT
INQWIRE 1.5J .035X260CM (WIRE)
KIT ENCORE 26 ADVANTAGE (KITS) IMPLANT
NDL PERC 18GX7CM (NEEDLE) IMPLANT
NEEDLE PERC 18GX7CM (NEEDLE) ×1 IMPLANT
PACK CARDIAC CATH (CUSTOM PROCEDURE TRAY) ×1 IMPLANT
PROTECTION STATION PRESSURIZED (MISCELLANEOUS) ×1
SET ATX-X65L (MISCELLANEOUS) IMPLANT
SHEATH AVANTI 6FR X 11CM (SHEATH) IMPLANT
SLEEVE REPOSITIONING LENGTH 30 (MISCELLANEOUS) IMPLANT
STATION PROTECTION PRESSURIZED (MISCELLANEOUS) IMPLANT
SUT SILK 0 FSL (SUTURE) IMPLANT

## 2023-05-25 NOTE — ED Notes (Signed)
ED provider at bedside.

## 2023-05-25 NOTE — ED Notes (Addendum)
48ma mechanical capture for external pacing.

## 2023-05-25 NOTE — ED Notes (Signed)
First nurse took pt to room, unable to get BP reading despite some multiple attempts

## 2023-05-25 NOTE — ED Notes (Signed)
Increased to 52ma after loss of mechanical capture

## 2023-05-25 NOTE — ED Notes (Signed)
Pt is A&Ox4. Reporting decreased pain after fentanyl administration. Zoll in place with rate of 60 and capturing at 52ma.

## 2023-05-25 NOTE — ED Triage Notes (Signed)
Pt reports that around 7pm she began feeling generalized weakness as well as sweaty and faint and she reports that she felt like she might faint.  This feeling continues.  No CP with this but she reports that "heart beat feels too hard"

## 2023-05-25 NOTE — ED Notes (Signed)
Used doppler to find radial pulse to ensure mechanical capture

## 2023-05-25 NOTE — ED Triage Notes (Signed)
EMS brings pt in from home for c/o weakness tonight

## 2023-05-25 NOTE — ED Notes (Signed)
Increased to 54ma to ensure mechanical capture

## 2023-05-26 ENCOUNTER — Inpatient Hospital Stay: Payer: Medicare Other

## 2023-05-26 DIAGNOSIS — R9389 Abnormal findings on diagnostic imaging of other specified body structures: Secondary | ICD-10-CM | POA: Diagnosis not present

## 2023-05-26 DIAGNOSIS — I952 Hypotension due to drugs: Secondary | ICD-10-CM | POA: Diagnosis not present

## 2023-05-26 DIAGNOSIS — Z951 Presence of aortocoronary bypass graft: Secondary | ICD-10-CM | POA: Diagnosis not present

## 2023-05-26 DIAGNOSIS — R0602 Shortness of breath: Secondary | ICD-10-CM | POA: Diagnosis not present

## 2023-05-26 DIAGNOSIS — I25810 Atherosclerosis of coronary artery bypass graft(s) without angina pectoris: Secondary | ICD-10-CM | POA: Diagnosis not present

## 2023-05-26 DIAGNOSIS — N179 Acute kidney failure, unspecified: Secondary | ICD-10-CM | POA: Insufficient documentation

## 2023-05-26 DIAGNOSIS — J9811 Atelectasis: Secondary | ICD-10-CM | POA: Diagnosis not present

## 2023-05-26 DIAGNOSIS — K219 Gastro-esophageal reflux disease without esophagitis: Secondary | ICD-10-CM | POA: Diagnosis present

## 2023-05-26 DIAGNOSIS — E1122 Type 2 diabetes mellitus with diabetic chronic kidney disease: Secondary | ICD-10-CM | POA: Diagnosis present

## 2023-05-26 DIAGNOSIS — I4430 Unspecified atrioventricular block: Secondary | ICD-10-CM | POA: Diagnosis not present

## 2023-05-26 DIAGNOSIS — Z79899 Other long term (current) drug therapy: Secondary | ICD-10-CM | POA: Diagnosis not present

## 2023-05-26 DIAGNOSIS — J302 Other seasonal allergic rhinitis: Secondary | ICD-10-CM | POA: Diagnosis present

## 2023-05-26 DIAGNOSIS — J90 Pleural effusion, not elsewhere classified: Secondary | ICD-10-CM | POA: Diagnosis not present

## 2023-05-26 DIAGNOSIS — N182 Chronic kidney disease, stage 2 (mild): Secondary | ICD-10-CM | POA: Diagnosis present

## 2023-05-26 DIAGNOSIS — E1165 Type 2 diabetes mellitus with hyperglycemia: Secondary | ICD-10-CM | POA: Diagnosis present

## 2023-05-26 DIAGNOSIS — Z006 Encounter for examination for normal comparison and control in clinical research program: Secondary | ICD-10-CM | POA: Diagnosis not present

## 2023-05-26 DIAGNOSIS — M159 Polyosteoarthritis, unspecified: Secondary | ICD-10-CM | POA: Diagnosis present

## 2023-05-26 DIAGNOSIS — E785 Hyperlipidemia, unspecified: Secondary | ICD-10-CM | POA: Diagnosis present

## 2023-05-26 DIAGNOSIS — I442 Atrioventricular block, complete: Secondary | ICD-10-CM | POA: Diagnosis not present

## 2023-05-26 DIAGNOSIS — I4439 Other atrioventricular block: Secondary | ICD-10-CM | POA: Diagnosis not present

## 2023-05-26 DIAGNOSIS — I2510 Atherosclerotic heart disease of native coronary artery without angina pectoris: Secondary | ICD-10-CM | POA: Diagnosis present

## 2023-05-26 DIAGNOSIS — Z8249 Family history of ischemic heart disease and other diseases of the circulatory system: Secondary | ICD-10-CM | POA: Diagnosis not present

## 2023-05-26 DIAGNOSIS — Z90710 Acquired absence of both cervix and uterus: Secondary | ICD-10-CM | POA: Diagnosis not present

## 2023-05-26 DIAGNOSIS — E872 Acidosis, unspecified: Secondary | ICD-10-CM | POA: Diagnosis present

## 2023-05-26 DIAGNOSIS — I441 Atrioventricular block, second degree: Secondary | ICD-10-CM | POA: Diagnosis not present

## 2023-05-26 DIAGNOSIS — I129 Hypertensive chronic kidney disease with stage 1 through stage 4 chronic kidney disease, or unspecified chronic kidney disease: Secondary | ICD-10-CM | POA: Diagnosis present

## 2023-05-26 DIAGNOSIS — I10 Essential (primary) hypertension: Secondary | ICD-10-CM | POA: Diagnosis not present

## 2023-05-26 DIAGNOSIS — Z7982 Long term (current) use of aspirin: Secondary | ICD-10-CM | POA: Diagnosis not present

## 2023-05-26 DIAGNOSIS — R001 Bradycardia, unspecified: Secondary | ICD-10-CM | POA: Diagnosis present

## 2023-05-26 DIAGNOSIS — I459 Conduction disorder, unspecified: Secondary | ICD-10-CM | POA: Diagnosis not present

## 2023-05-26 DIAGNOSIS — I252 Old myocardial infarction: Secondary | ICD-10-CM | POA: Diagnosis not present

## 2023-05-26 DIAGNOSIS — I700 Atherosclerosis of aorta: Secondary | ICD-10-CM | POA: Diagnosis not present

## 2023-05-26 DIAGNOSIS — J984 Other disorders of lung: Secondary | ICD-10-CM | POA: Diagnosis not present

## 2023-05-26 DIAGNOSIS — E871 Hypo-osmolality and hyponatremia: Secondary | ICD-10-CM | POA: Diagnosis present

## 2023-05-26 DIAGNOSIS — R55 Syncope and collapse: Secondary | ICD-10-CM | POA: Diagnosis present

## 2023-05-26 DIAGNOSIS — D72829 Elevated white blood cell count, unspecified: Secondary | ICD-10-CM | POA: Diagnosis present

## 2023-05-26 DIAGNOSIS — Z950 Presence of cardiac pacemaker: Secondary | ICD-10-CM | POA: Diagnosis not present

## 2023-05-26 LAB — CBC
HCT: 38.1 % (ref 36.0–46.0)
Hemoglobin: 13 g/dL (ref 12.0–15.0)
MCH: 33.1 pg (ref 26.0–34.0)
MCHC: 34.1 g/dL (ref 30.0–36.0)
MCV: 96.9 fL (ref 80.0–100.0)
Platelets: 260 10*3/uL (ref 150–400)
RBC: 3.93 MIL/uL (ref 3.87–5.11)
RDW: 12.6 % (ref 11.5–15.5)
WBC: 11.4 10*3/uL — ABNORMAL HIGH (ref 4.0–10.5)
nRBC: 0 % (ref 0.0–0.2)

## 2023-05-26 LAB — COMPREHENSIVE METABOLIC PANEL
ALT: 50 U/L — ABNORMAL HIGH (ref 0–44)
AST: 66 U/L — ABNORMAL HIGH (ref 15–41)
Albumin: 3.4 g/dL — ABNORMAL LOW (ref 3.5–5.0)
Alkaline Phosphatase: 42 U/L (ref 38–126)
Anion gap: 8 (ref 5–15)
BUN: 26 mg/dL — ABNORMAL HIGH (ref 8–23)
CO2: 25 mmol/L (ref 22–32)
Calcium: 9.1 mg/dL (ref 8.9–10.3)
Chloride: 105 mmol/L (ref 98–111)
Creatinine, Ser: 1.49 mg/dL — ABNORMAL HIGH (ref 0.44–1.00)
GFR, Estimated: 34 mL/min — ABNORMAL LOW (ref >60)
Glucose, Bld: 134 mg/dL — ABNORMAL HIGH (ref 70–99)
Potassium: 4.2 mmol/L (ref 3.5–5.1)
Sodium: 138 mmol/L (ref 135–145)
Total Bilirubin: 0.9 mg/dL (ref 0.3–1.2)
Total Protein: 6.2 g/dL — ABNORMAL LOW (ref 6.5–8.1)

## 2023-05-26 LAB — MRSA NEXT GEN BY PCR, NASAL: MRSA by PCR Next Gen: NOT DETECTED

## 2023-05-26 LAB — TROPONIN I (HIGH SENSITIVITY)
Troponin I (High Sensitivity): 218 ng/L (ref <18)
Troponin I (High Sensitivity): 207 ng/L (ref <18)
Troponin I (High Sensitivity): 235 ng/L (ref <18)

## 2023-05-26 LAB — GLUCOSE, CAPILLARY
Glucose-Capillary: 139 mg/dL — ABNORMAL HIGH (ref 70–99)
Glucose-Capillary: 143 mg/dL — ABNORMAL HIGH (ref 70–99)
Glucose-Capillary: 121 mg/dL — ABNORMAL HIGH (ref 70–99)
Glucose-Capillary: 119 mg/dL — ABNORMAL HIGH (ref 70–99)
Glucose-Capillary: 117 mg/dL — ABNORMAL HIGH (ref 70–99)
Glucose-Capillary: 152 mg/dL — ABNORMAL HIGH (ref 70–99)

## 2023-05-26 LAB — PHOSPHORUS: Phosphorus: 4.5 mg/dL (ref 2.5–4.6)

## 2023-05-26 LAB — MAGNESIUM: Magnesium: 2.4 mg/dL (ref 1.7–2.4)

## 2023-05-26 MED ORDER — LACTATED RINGERS IV SOLN: INTRAVENOUS | Status: AC

## 2023-05-26 MED ORDER — HEPARIN SODIUM (PORCINE) 1000 UNIT/ML IJ SOLN
INTRAMUSCULAR | Status: AC
  Filled 2023-05-26: qty 10

## 2023-05-26 MED ORDER — HEPARIN (PORCINE) IN NACL 1000-0.9 UT/500ML-% IV SOLN
INTRAVENOUS | Status: AC
  Filled 2023-05-26: qty 1000

## 2023-05-26 MED ORDER — MIDAZOLAM HCL 2 MG/2ML IJ SOLN
INTRAMUSCULAR | Status: AC
  Filled 2023-05-26: qty 2

## 2023-05-26 MED ORDER — VERAPAMIL HCL 2.5 MG/ML IV SOLN
INTRAVENOUS | Status: AC
  Filled 2023-05-26: qty 2

## 2023-05-26 MED ORDER — MIDAZOLAM HCL 2 MG/2ML IJ SOLN
INTRAMUSCULAR | Status: DC | PRN
  Administered 2023-05-26: .5 mg via INTRAVENOUS

## 2023-05-26 MED ORDER — CEFAZOLIN SODIUM-DEXTROSE 2-4 GM/100ML-% IV SOLN
2.0000 g | INTRAVENOUS | Status: DC
  Filled 2023-05-26: qty 100

## 2023-05-26 MED ORDER — SODIUM CHLORIDE 0.9 % IV SOLN: INTRAVENOUS | Status: DC

## 2023-05-26 MED ORDER — LIDOCAINE HCL 1 % IJ SOLN
INTRAMUSCULAR | Status: AC
  Filled 2023-05-26: qty 20

## 2023-05-26 MED ORDER — FENTANYL CITRATE (PF) 100 MCG/2ML IJ SOLN
INTRAMUSCULAR | Status: AC
  Filled 2023-05-26: qty 2

## 2023-05-26 MED ORDER — CHLORHEXIDINE GLUCONATE CLOTH 2 % EX PADS
6.0000 | MEDICATED_PAD | Freq: Every day | CUTANEOUS | Status: DC
  Administered 2023-05-27 (×2): 6 via TOPICAL

## 2023-05-26 MED ORDER — HEPARIN SODIUM (PORCINE) 5000 UNIT/ML IJ SOLN
5000.0000 [IU] | Freq: Two times a day (BID) | INTRAMUSCULAR | Status: DC
  Administered 2023-05-26 (×2): 5000 [IU] via SUBCUTANEOUS
  Filled 2023-05-26 (×2): qty 1

## 2023-05-26 MED ORDER — DOCUSATE SODIUM 100 MG PO CAPS: 100.0000 mg | ORAL_CAPSULE | Freq: Two times a day (BID) | ORAL | Status: DC | PRN

## 2023-05-26 MED ORDER — LIDOCAINE HCL (PF) 1 % IJ SOLN
INTRAMUSCULAR | Status: DC | PRN
  Administered 2023-05-26: 15 mL

## 2023-05-26 MED ORDER — FENTANYL CITRATE (PF) 100 MCG/2ML IJ SOLN
INTRAMUSCULAR | Status: DC | PRN
  Administered 2023-05-26: 12.5 ug via INTRAVENOUS

## 2023-05-26 MED ORDER — HEPARIN (PORCINE) IN NACL 2000-0.9 UNIT/L-% IV SOLN
INTRAVENOUS | Status: DC | PRN
  Administered 2023-05-26: 1000 mL

## 2023-05-26 MED ORDER — POLYETHYLENE GLYCOL 3350 17 G PO PACK: 17.0000 g | PACK | Freq: Every day | ORAL | Status: DC | PRN

## 2023-05-26 NOTE — ED Notes (Signed)
Family at bedside and updated

## 2023-05-26 NOTE — CV Procedure (Signed)
Temporary transvenous pacemaker placement Indication complete heart block Patient presented emergency room with severe bradycardia complete heart block with the 20s while on low-dose propranolol  Patient had transcutaneous pacer placed appears to be capturing adequately but somewhat uncomfortable to the patient  Hemodynamically stable blood pressure systolic of 140 \ Patient brought to the cardiac Cath Lab emergently for temporary transvenous pacemaker placement  Temp wire was placed via the right femoral vein under ultrasound guidance without difficulty Pacemaker settings were rate of 60 sensitivity to output of 2  Patient appeared to have adequate capture transcutaneous pacing was discontinued patient is to remain at bedrest and transferred to ICU anticipate permanent pacemaker within 24 to 48 hours

## 2023-05-26 NOTE — ED Notes (Signed)
Pt taken to cath lab by assigned RN and cardiologist at this time.

## 2023-05-26 NOTE — Consult Note (Signed)
[facility] CLINIC CARDIOLOGY CONSULT NOTE       Patient ID: Sheila Kim MRN: [id number] DOB/AGE: 10-25-1940 82 y.o.  Admit date: 05/25/2023 Referring Physician Dr. Corena Herter Primary Physician Dr. Shirlee Latch  Primary Cardiologist Dr. Darrold Junker Reason for Consultation complete heart block   HPI: Sheila Kim is an 82yoF with a PMH of CAD s/p CABG x 4 (LIMA to D1/LAD, SVG to D2, PDA (05/17/2013), HTN, HLD, statin intolerance, who presented to [facility] ED 05/26/2023 with generalized weakness, dizziness.  In the ED, she was alert and able to provide a history, manual BP in triage was 34/14 and HR in the 20s.  Reportedly in complete heart block.  She was externally paced until emergent right femoral temporary transvenous pacemaker was placed overnight by Dr. Juliann Pares.  The patient states she was in her usual state of health, pulling weeds in her yard preparing for someone to come and cut her grass.  While her grass is being cut, she read a book, made dinner and then following dinner around 7:15 PM she says she "broke out into a sweat" and felt "fuzzy and dizzy" and very lightheaded.  She drank some water and lay down with mild improvement, but got up again to take her trash out and felt extremely weak.  She called her neighbor to come over and they gave her some Gatorade but eventually decided to call EMS for further assistance as the patient was not improving.  She had a manual BP of 34-14 and heart rate was in the 20s reportedly and complete heart block by twelve-lead.  She was externally paced until temporary transvenous pacemaker in the right femoral vein was placed overnight by Dr. Juliann Pares.  At my time of evaluation this morning she is laying flat in the ICU, ventricular pacing on telemetry with rate at 60.  She says she felt immediately better once she was on the table in the Cath Lab and the temporary wire was placed.  She denies preceding chest pain or any current chest  pain.  No shortness of breath, no current lightheadedness or dizziness and feels overall much better than she did prior to presentation.    Review of systems complete and found to be negative unless listed above     Past Medical History:  Diagnosis Date   Anemia    history   Arthritis    hands, spine   Chronic kidney disease    reports one kidney "doesn't work well"   GERD (gastroesophageal reflux disease)    Hypertension    Motion sickness    boats   Seasonal allergies     Past Surgical History:  Procedure Laterality Date   ABDOMINAL HYSTERECTOMY     BACK SURGERY     BREAST CYST ASPIRATION Left    COLONOSCOPY WITH PROPOFOL N/A 10/03/2015   Procedure: COLONOSCOPY WITH PROPOFOL;  Surgeon: Midge Minium, MD;  [facility];  Service: Endoscopy;  Laterality: N/A;   CORONARY ARTERY BYPASS GRAFT  05/17/2013   quadruple   DILATION AND CURETTAGE OF UTERUS     EYE SURGERY Bilateral 2015   cataract   NOSE SURGERY  1962   deviated septum, hemorrhaged- pt had to return to hospital   TONSILLECTOMY  1944   ureteropelvic junction obstruction      Medications Prior to Admission  Medication Sig Dispense Refill Last Dose   acetaminophen (TYLENOL) 500 MG tablet Take 500 mg by mouth. In the PM and as needed   prn  at prn   acetaminophen (TYLENOL) 650 MG CR tablet Take 650 mg by mouth. 2 tablets in the AM and 1 tablet in the PM   prn at prn   ASPERCREME LIDOCAINE EX Apply 1 Application topically as needed. Roll on. Use every day   prn at prn   aspirin 81 MG tablet Take 81 mg by mouth daily.    Unknown at Unknown   Calcium Magnesium Zinc 333-133-5 MG TABS Take 1 tablet by mouth daily.   Unknown at Unknown   cholecalciferol (VITAMIN D) 1000 UNITS tablet Take 1,000 Units by mouth daily.    Unknown at Unknown   Coenzyme Q10 (COQ10) 100 MG CAPS Take 200 mg by mouth daily.    Unknown at Unknown   cyanocobalamin 1000 MCG tablet Take 1,000 mcg by mouth daily.    Unknown at Unknown    famotidine (PEPCID) 20 MG tablet Take 20 mg by mouth 2 (two) times daily.   Unknown at Unknown   ferrous sulfate 325 (65 FE) MG tablet Take 325 mg by mouth daily with breakfast.    Unknown at Unknown   fluticasone (FLONASE) 50 MCG/ACT nasal spray Place 2 sprays into both nostrils daily.   Unknown at Unknown   folic acid (FOLVITE) 1 MG tablet Take 1 mg by mouth daily.    Unknown at Unknown   furosemide (LASIX) 40 MG tablet Take 0.5 tablets (20 mg total) by mouth 2 (two) times daily. 100 tablet 3 Unknown at Unknown   ketotifen (ZADITOR) 0.025 % ophthalmic solution Place 1 drop into both eyes as needed.   prn at prn   loratadine (CLARITIN) 10 MG tablet Take 10 mg by mouth daily.    Unknown at Unknown   MELATONIN EXTRA STRENGTH PO Take 0.25 tablets by mouth at bedtime as needed.    prn at prn   pantoprazole (PROTONIX) 40 MG tablet Take 1 tablet (40 mg total) by mouth 2 (two) times daily. 200 tablet 3 Unknown at Unknown   Polyethyl Glycol-Propyl Glycol 0.4-0.3 % SOLN Apply to eye as needed.   prn at prn   polyethylene glycol (MIRALAX / GLYCOLAX) packet Take 17 g by mouth daily.   Unknown at Unknown   potassium chloride SA (KLOR-CON M) 20 MEQ tablet Take 1 tablet (20 mEq total) by mouth daily. 100 tablet 3 Unknown at Unknown   Probiotic Product ([employer] SENIOR PROBIOTIC) CAPS Take by mouth daily.    Unknown at Unknown   propranolol (INDERAL) 40 MG tablet Take 0.5 tablets (20 mg total) by mouth 2 (two) times daily. 100 tablet 3 Unknown at Unknown   Psyllium Husk POWD Take by mouth daily.    Unknown at Unknown   pyridoxine (B-6) 100 MG tablet Take 100 mg by mouth daily.    Unknown at Unknown   rosuvastatin (CRESTOR) 5 MG tablet Take 1 pill PO daily 5 times weekly. 65 tablet 3 Unknown at Unknown   Alpha-D-Galactosidase (BEANO TO GO) TABS Take by mouth daily at 6 (six) AM. occasionally (Patient not taking: Reported on 05/26/2023)   Not Taking   Multiple Vitamins-Minerals ([employer]S SPECTRAVITE ADULT 50+ PO) Spectravite  [facility] - Historical Medication  daily Active   Unknown at Unknown   Social History   Socioeconomic History   Marital status: Widowed    Spouse name: Daphine Deutscher   Number of children: 2   Years of education: 12   Highest education level: Some college, no degree  Occupational History  Occupation: retired  Tobacco Use   Smoking status: Never   Smokeless tobacco: Never  Vaping Use   Vaping status: Never Used  Substance and Sexual Activity   Alcohol use: No   Drug use: No   Sexual activity: Not on file  Other Topics Concern   Not on file  Social History Narrative   Not on file   Social Determinants of Health   Financial Resource Strain: Low Risk  (10/16/2020)   Overall Financial Resource Strain (CARDIA)    Difficulty of Paying Living Expenses: Not hard at all  Food Insecurity: No Food Insecurity (10/16/2020)   Hunger Vital Sign    Worried About Running Out of Food in the Last Year: Never true    Ran Out of Food in the Last Year: Never true  Transportation Needs: No Transportation Needs (10/16/2020)   PRAPARE - Administrator, Civil Service (Medical): No    Lack of Transportation (Non-Medical): No  Physical Activity: Sufficiently Active (10/16/2020)   Exercise Vital Sign    Days of Exercise per Week: 6 days    Minutes of Exercise per Session: 30 min  Stress: No Stress Concern Present (10/16/2020)   [employer] of Occupational Health - Occupational Stress Questionnaire    Feeling of Stress : Not at all  Social Connections: Moderately Isolated (10/16/2020)   Social Connection and Isolation Panel [NHANES]    Frequency of Communication with Friends and Family: More than three times a week    Frequency of Social Gatherings with Friends and Family: More than three times a week    Attends Religious Services: Never    Database administrator or Organizations: Yes    Attends Engineer, structural: More than 4 times per year    Marital Status: Widowed   Intimate Partner Violence: Not At Risk (10/16/2020)   Humiliation, Afraid, Rape, and Kick questionnaire    Fear of Current or Ex-Partner: No    Emotionally Abused: No    Physically Abused: No    Sexually Abused: No    Family History  Problem Relation Age of Onset   Heart attack Mother        3 MI's   Diabetes Mother    Heart disease Mother    Diabetes Father    Heart attack Father    COPD Father    Stroke Brother    Diabetes Sister    Heart disease Sister        2 stents   Breast cancer Neg Hx       Intake/Output Summary (Last 24 hours) at 05/26/2023 0815 Last data filed at 05/26/2023 0800 Gross per 24 hour  Intake 299.1 ml  Output --  Net 299.1 ml    Vitals:   05/26/23 0400 05/26/23 0500 05/26/23 0600 05/26/23 0800  BP: 123/66 116/79 135/71 (!) 158/71  Pulse: (!) 59 (!) 59 (!) 59 (!) 58  Resp: 15 16 16 18   Temp: 98.5 F (36.9 C)   98.3 F (36.8 C)  TempSrc: Oral   Oral  SpO2: 94% 93% 95% 97%  Weight:      Height:        PHYSICAL EXAM General: pleasant conversational elderly female , well nourished, in no acute distress. Laying flat in bed in ICU HEENT:  Normocephalic and atraumatic. Neck:  No JVD.  Lungs: Normal respiratory effort on room air. Clear bilaterally to auscultation. No wheezes, crackles, rhonchi.  Heart: V paced rhythm . Normal S1 and  S2 without gallops or murmurs.  Abdomen: Non-distended appearing.  Msk: Normal strength and tone for age. Extremities: R groin with temp transvenous pacemaker wire. No peripheral edema.  Neuro: Alert and oriented X 3. Psych:  Answers questions appropriately.   Labs: Basic Metabolic Panel: Recent Labs    05/25/23 2239 05/26/23 0440  NA 134* 138  K 4.9 4.2  CL 98 105  CO2 20* 25  GLUCOSE 238* 134*  BUN 30* 26*  CREATININE 1.85* 1.49*  CALCIUM 9.7 9.1  MG 2.6* 2.4  PHOS  --  4.5   Liver Function Tests: Recent Labs    05/26/23 0440  AST 66*  ALT 50*  ALKPHOS 42  BILITOT 0.9  PROT 6.2*  ALBUMIN  3.4*   No results for input(s): "LIPASE", "AMYLASE" in the last 72 hours. CBC: Recent Labs    05/25/23 2239 05/26/23 0440  WBC 11.4* 11.4*  HGB 15.1* 13.0  HCT 46.1* 38.1  MCV 98.9 96.9  PLT 320 260   Cardiac Enzymes: No results for input(s): "CKTOTAL", "CKMB", "CKMBINDEX", "TROPONINIHS" in the last 72 hours. BNP: No results for input(s): "BNP" in the last 72 hours. D-Dimer: No results for input(s): "DDIMER" in the last 72 hours. Hemoglobin A1C: No results for input(s): "HGBA1C" in the last 72 hours. Fasting Lipid Panel: No results for input(s): "CHOL", "HDL", "LDLCALC", "TRIG", "CHOLHDL", "LDLDIRECT" in the last 72 hours. Thyroid Function Tests: No results for input(s): "TSH", "T4TOTAL", "T3FREE", "THYROIDAB" in the last 72 hours.  Invalid input(s): "FREET3" Anemia Panel: No results for input(s): "VITAMINB12", "FOLATE", "FERRITIN", "TIBC", "IRON", "RETICCTPCT" in the last 72 hours.   Radiology: No results found.  ECHO 11/01/2016 INTERPRETATION  Normal Stress Echocardiogram  NORMAL RIGHT VENTRICULAR SYSTOLIC FUNCTION  MODERATE VALVULAR REGURGITATION (See above)  NO VALVULAR STENOSIS NOTED    ___________________________________________________________________________________________  Electronically signed by: MD Jamse Mead on 11/02/2016 09:01 AM              Performed By: Luretha Murphy, RDCS, RVT        Ordering Physician: Leanora Ivanoff   TELEMETRY reviewed by me (LT) 05/26/2023 : Ventricular pacing at 60 bpm  EKG reviewed by me: Ventricular pacing rhythm at 60 bpm from 0127 on 8/15  Data reviewed by me (LT) 05/26/2023: ED note, last outpatient cardiology note, admission H&P, nursing notes last 24h vitals tele labs imaging I/O   Principal Problem:   Complete heart block (HCC) Active Problems:   AKI (acute kidney injury) (HCC)    ASSESSMENT AND PLAN:  Rashanna P. Daube is an 82yoF with a PMH of CAD s/p CABG x 4 (LIMA to D1/LAD, SVG to D2, PDA  (05/17/2013), HTN, HLD, statin intolerance, who presented to [facility] ED 05/26/2023 with generalized weakness, dizziness.  In the ED, she was alert and able to provide a history, manual BP in triage was 34/14 and HR in the 20s.  Reportedly in complete heart block.  She was externally paced until emergent right femoral temporary transvenous pacemaker was placed overnight by Dr. Juliann Pares.  # Complete heart block Hemodynamically unstable on presentation with no palpable pulse and hypotensive.  She does take propranolol 20 mg twice daily for hypertension, but unlikely that this low dose of this medication is the cause of her CHB.  Clinically improved after temporary transvenous pacemaker was placed. Underlying rhythm CHB with atrial rate 75bpm. Discussed the risks and benefits of proceeding with either dual-chamber PPM versus Micra leadless PPM with the patient. She is agreeable to proceed with [employer]tronic  Micra leadless PPM.  Will keep n.p.o. after midnight and and plan for PPM with Dr. Marcina Millard tomorrow AM, 8/16.  - hold labetalol and lasix   # CAD s/p CABG x 4 (2014) Chest pain free. Continue aspirin 81mg  dialy and crestor 5mg  daily. Intolerant of higher intensity statins  This patient's plan of care was discussed and created with Dr. Juliann Pares and he is in agreement.  Signed: Rebeca Allegra , PA-C 05/26/2023, 8:15 AM [facility]

## 2023-05-26 NOTE — ED Notes (Signed)
ED Provider at bedside. 

## 2023-05-26 NOTE — ED Notes (Signed)
ED, ICU, and Cardiology Provider at bedside for assessment and talk with pt and family about poc.

## 2023-05-26 NOTE — ED Notes (Addendum)
Pt transported to the cath lab on monitor, Zoll, and 2L [place] by 2 Rns. Report given to Cath lab RN.

## 2023-05-26 NOTE — ED Provider Notes (Signed)
[facility] Provider Note    Event Date/Time   First MD Initiated Contact with Patient 05/25/23 2221     (approximate)   History   Near Syncope   HPI  Sheila Kim is a 82 y.o. female presents to the emergency department with generalized weakness and breaking out into a sweat.  Patient states today at 715 she sat down to eat dinner and all of a sudden broke out into a sweat and was not feeling well.  Went to lay down but then was feeling very dizzy so she called 911 to come to the emergency department.  Denies any chest pain at this time.  Does take propranolol for blood pressure twice a day.  Not on anticoagulation.     Physical Exam   Triage Vital Signs: ED Triage Vitals  Encounter Vitals Group     BP 05/25/23 2206 (!) 34/14     Systolic BP Percentile --      Diastolic BP Percentile --      Pulse Rate 05/25/23 2206 (!) 57     Resp 05/25/23 2202 16     Temp 05/25/23 2202 (!) 97.5 F (36.4 C)     Temp Source 05/25/23 2202 Oral     SpO2 05/25/23 2206 99 %     Weight --      Height --      Head Circumference --      Peak Flow --      Pain Score 05/25/23 2200 0     Pain Loc --      Pain Education --      Exclude from Growth Chart --     Most recent vital signs: Vitals:   05/25/23 2345 05/26/23 0003  BP: 130/67   Pulse: 60 60  Resp: (!) 22 15  Temp:  98.1 F (36.7 C)  SpO2: 96% 96%    Physical Exam Constitutional:      Appearance: She is well-developed. She is ill-appearing.  HENT:     Head: Atraumatic.  Eyes:     Conjunctiva/sclera: Conjunctivae normal.  Cardiovascular:     Rate and Rhythm: Bradycardia present.  Pulmonary:     Effort: No respiratory distress.  Abdominal:     General: There is no distension.  Musculoskeletal:        General: Normal range of motion.     Cervical back: Normal range of motion.  Skin:    General: Skin is warm.  Neurological:     Mental Status: She is alert. Mental status is at  baseline.     IMPRESSION / MDM / ASSESSMENT AND PLAN / ED COURSE  I reviewed the triage vital signs and the nursing notes.  On arrival patient's blood pressure significantly low with heart rate in the 20s.  Initial blood pressure in triage 34/14.  Patient is ill-appearing but able to state her name and give a history.  Unable to get a automatic blood pressure.  Unable to get a palpable pulse.  EKG consistent with complete heart block.  Immediately called cardiology and discussed with Dr. Juliann Pares.   EKG  I, Corena Herter, the attending physician, personally viewed and interpreted this ECG.  Bradycardia with a heart rate of 21, complete heart block.  Prolonged PR.  Wide QRS at 133.  Bradycardia while on cardiac telemetry. LABS (all labs ordered are listed, but only abnormal results are displayed) Labs interpreted as -    Labs Reviewed  BASIC METABOLIC PANEL -  Abnormal; Notable for the following components:      Result Value   Sodium 134 (*)    CO2 20 (*)    Glucose, Bld 238 (*)    BUN 30 (*)    Creatinine, Ser 1.85 (*)    GFR, Estimated 27 (*)    Anion gap 16 (*)    All other components within normal limits  CBC - Abnormal; Notable for the following components:   WBC 11.4 (*)    Hemoglobin 15.1 (*)    HCT 46.1 (*)    All other components within normal limits  MAGNESIUM - Abnormal; Notable for the following components:   Magnesium 2.6 (*)    All other components within normal limits  URINALYSIS, ROUTINE W REFLEX MICROSCOPIC  CBG MONITORING, ED     MDM    Clinical picture consistent with complete heart block.  Patient does take propranolol but only a low dose for hypertension.  Unable to get a good blood pressure.  Patient ill-appearing.  Externally paced and given IV fentanyl for pain control.  Able to get mechanical capture and a blood pressure improved to 140/66.  No significant electrolyte abnormalities.  Consulted ICU for admission.  Cardiology with Dr. Juliann Pares  came to the emergency department and evaluated the patient with plan to go for a temporary pacemaker.   PROCEDURES:  Critical Care performed: yes  .Critical Care  Performed by: Corena Herter, MD Authorized by: Corena Herter, MD   Critical care provider statement:    Critical care time (minutes):  30   Critical care time was exclusive of:  Separately billable procedures and treating other patients   Critical care was necessary to treat or prevent imminent or life-threatening deterioration of the following conditions:  Cardiac failure   Critical care was time spent personally by me on the following activities:  Development of treatment plan with patient or surrogate, discussions with consultants, evaluation of patient's response to treatment, examination of patient, ordering and review of laboratory studies, ordering and review of radiographic studies, ordering and performing treatments and interventions, pulse oximetry, re-evaluation of patient's condition and review of old charts   Patient's presentation is most consistent with acute presentation with potential threat to life or bodily function.   MEDICATIONS ORDERED IN ED: Medications  norepinephrine (LEVOPHED) 4mg  in (0.016 mg/mL) premix infusion (has no administration in time range)  midazolam (VERSED) injection 2 mg (has no administration in time range)  fentaNYL (SUBLIMAZE) injection 50 mcg (50 mcg Intravenous Given 05/25/23 2235)  fentaNYL (SUBLIMAZE) injection 50 mcg ( Intravenous Not Given 05/25/23 2303)  fentaNYL (SUBLIMAZE) injection 50 mcg (50 mcg Intravenous Given 05/25/23 2357)    FINAL CLINICAL IMPRESSION(S) / ED DIAGNOSES   Final diagnoses:  Heart block AV complete (HCC)     Rx / DC Orders   ED Discharge Orders     None        Note:  This document was prepared using Dragon voice recognition software and may include unintentional dictation errors.   Corena Herter, MD 05/26/23 0010

## 2023-05-26 NOTE — ED Notes (Addendum)
Son and daughter in law taken to waiting room.

## 2023-05-26 NOTE — H&P (Addendum)
NAME:  Sheila Kim, MRN:  [id number], DOB:  01-04-1941, LOS: 0 ADMISSION DATE:  05/25/2023, CONSULTATION DATE:  05/26/23 REFERRING MD:  Dr. Juliann Pares, CHIEF COMPLAINT:  Near Syncope   History of Present Illness:  82 yo F presenting to [facility] ED from home via EMS on 05/25/23 for evaluation of near syncope.  History provided per chart review and patient report bedside. Patient reported being in her normal state of health until 19:15 on 05/25/23. She had sat down to eat dinner and broke out in a sweat, feeling dizzy and weak. She also endorsed nausea and blurred vision, feeling as though she might faint. She denied chest pain, dyspnea, loss of consciousness, fall, fever/chills, vomiting/ diarrhea/ abdominal pain. She does take propanolol for blood pressure with her evening dose around 19:00 pm. She had been working in the yard and thought maybe she had overheated or was dehydrated and laid down for an hour, which didn't help. She then called a neighbor who arrived, giving her Gatorade. When her symptoms persisted EMS was called.  ED course: Upon arrival patient ill-appearing but alert and responsive with difficulty obtaining BP readings and in complete HB with HR in the 20's. TC pacing was initiated and STEMI team called in for emergent TV pacer placement. Labs significant for mild AGMA, hyperglycemia, AKI, mild leukocytosis & mild hyponatremia.   Medications given: fentanyl IVP Initial Vitals: 97.5, 20, 25, 34/14 & 97% on 2 L [place] Significant labs: (Labs/ Imaging personally reviewed) No initial EKG noted in record ~ pending post TV pacer placement Chemistry: Na+: 134, K+: 4.9, BUN/Cr.: 30/ 1.85, Serum CO2/ AG: 20/ 16, Mg: 2.6, Glucose: 238 Hematology: WBC: 11.4, Hgb: 15.1  PCCM consulted for admission due to complete HB s/p urgent TV pacer placement.  Pertinent  Medical History  CAD s/p CABG x4 (2014) HTN HLD Anemia Diverticulitis Duodenal  Ulcer Osteoarthritis Osteoporosis  Significant Hospital Events: Including procedures, antibiotic start and stop dates in addition to other pertinent events   05/26/23: Admit to ICU with complete HB s/p TV pacer placement  Interim History / Subjective:  Patient alert and responsive, place of care discussed all questions and concerns answered at this time. No current complaints.  Objective   Blood pressure (!) 159/75, pulse (!) 59, temperature 98.1 F (36.7 C), temperature source Oral, resp. rate 17, SpO2 97%.       No intake or output data in the 24 hours ending 05/26/23 0050 There were no vitals filed for this visit.  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 PMHx: HTN, CAD s/p CABG x 4 (2014), HLD Cardiology will determine whether permanent pacemaker is needed in AM - continuous cardiac monitoring - hold propanolol & lasix at this time - Cardiology following, appreciate input - keep NPO overnight, potential permanent pacemaker in AM  Acute Kidney Injury  AGMA Baseline Cr: 1.20, Cr on admission:1.85 - Strict I/O's: alert provider if UOP < 0.5 mL/kg/hr - gentle IVF hydration  - Daily BMP, replace electrolytes  PRN - Avoid nephrotoxic agents as able, ensure adequate renal perfusion  Leukocytosis Suspect reactive - daily CBC, monitor WBC/fever curve  Hyperglycemia Recent Hemoglobin A1C: 6.0 - Monitor CBG Q 4 hours- while NPO - consider SSI dosing - target  range while in ICU: 140-180 - follow ICU hyper/hypo-glycemia protocol  Best Practice (right click and "Reselect all SmartList Selections" daily)  Diet/type: NPO w/ oral meds DVT prophylaxis: SCD GI prophylaxis: H2B and PPI Lines: N/A Foley:  N/A Code Status:  full code Last date of multidisciplinary goals of care discussion [05/26/23]  Labs   CBC: Recent Labs  Lab 05/25/23 2239  WBC 11.4*  HGB 15.1*  HCT 46.1*  MCV 98.9  PLT 320    Basic Metabolic Panel: Recent Labs  Lab 05/25/23 2239  NA 134*  K 4.9  CL 98  CO2 20*  GLUCOSE 238*  BUN 30*  CREATININE 1.85*  CALCIUM 9.7  MG 2.6*   GFR: Estimated Creatinine Clearance: 15.4 mL/min (A) (by C-G formula based on SCr of 1.85 mg/dL (H)). Recent Labs  Lab 05/25/23 2239  WBC 11.4*    Liver Function Tests: No results for input(s): "AST", "ALT", "ALKPHOS", "BILITOT", "PROT", "ALBUMIN" in the last 168 hours. No results for input(s): "LIPASE", "AMYLASE" in the last 168 hours. No results for input(s): "AMMONIA" in the last 168 hours.  ABG No results found for: "PHART", "PCO2ART", "PO2ART", "HCO3", "TCO2", "ACIDBASEDEF", "O2SAT"   Coagulation Profile: No results for input(s): "INR", "PROTIME" in the last 168 hours.  Cardiac Enzymes: No results for input(s): "CKTOTAL", "CKMB", "CKMBINDEX", "TROPONINI" in the last 168 hours.  HbA1C: Hgb A1c MFr Bld  Date/Time Value Ref Range Status  05/16/2023 02:00 PM 6.0 (H) 4.8 - 5.6 % Final    Comment:             Prediabetes: 5.7 - 6.4          Diabetes: >6.4          Glycemic control for adults with diabetes: <7.0   11/15/2022 02:20 PM 6.1 (H) 4.8 - 5.6 % Final    Comment:             Prediabetes: 5.7 - 6.4          Diabetes: >6.4          Glycemic control for adults with diabetes: <7.0     CBG: No results for input(s): "GLUCAP" in the last 168 hours.  Review of Systems: Positives in BOLD  Gen: Denies fever, chills, weight change, fatigue, diaphoresis, night  sweats HEENT: Denies blurred vision, double vision, hearing loss, tinnitus, sinus congestion, rhinorrhea, sore throat, neck stiffness, dysphagia PULM: Denies shortness of breath, cough, sputum production, hemoptysis, wheezing CV: Denies chest pain, edema, orthopnea, paroxysmal nocturnal dyspnea, palpitations GI: Denies abdominal pain, nausea, vomiting, diarrhea, hematochezia, melena, constipation, change in bowel habits GU: Denies dysuria, hematuria, polyuria, oliguria, urethral discharge Endocrine: Denies hot or cold intolerance, polyuria, polyphagia or appetite change Derm: Denies rash, dry skin, scaling or peeling skin change Heme: Denies easy bruising, bleeding, bleeding gums Neuro: Denies headache, numbness, weakness, slurred speech, loss of memory or consciousness  Past Medical History:  She,  has a past medical history of Anemia, Arthritis, Chronic kidney disease, GERD (gastroesophageal reflux disease), Hypertension, Motion sickness, and Seasonal allergies.   Surgical History:   Past Surgical History:  Procedure Laterality Date   ABDOMINAL HYSTERECTOMY     BACK SURGERY     BREAST CYST ASPIRATION Left    COLONOSCOPY  WITH PROPOFOL N/A 10/03/2015   Procedure: COLONOSCOPY WITH PROPOFOL;  Surgeon: Midge Minium, MD;  Location: [facility];  Service: Endoscopy;  Laterality: N/A;   CORONARY ARTERY BYPASS GRAFT  05/17/2013   quadruple   DILATION AND CURETTAGE OF UTERUS     EYE SURGERY Bilateral 2015   cataract   NOSE SURGERY  1962   deviated septum, hemorrhaged- pt had to return to hospital   TONSILLECTOMY  1944   ureteropelvic junction obstruction       Social History:   reports that she has never smoked. She has never used smokeless tobacco. She reports that she does not drink alcohol and does not use drugs.   Family History:  Her family history includes COPD in her father; Diabetes in her father, mother, and sister; Heart attack in her father and mother; Heart disease in  her mother and sister; Stroke in her brother. There is no history of Breast cancer.   Allergies Allergies  Allergen Reactions   Alcohol     Gastric pain   Gelatin Capsule Empty  [Gelatin]     Stomach pains   Gemfibrozil     Stomach pains, urine retention and muscle weakness.   Meloxicam     Drastically alters taste   Nsaids Other (See Comments)    Tolerates aspirin stomach pains and reflux   Oxycodone Itching    Trembling   Simvastatin Other (See Comments)    Feet and leg pain and general body weakness.   Sulfa Antibiotics Nausea And Vomiting and Other (See Comments)   Clarithromycin Rash and Nausea And Vomiting    Terrible taste in mouth and stomach ache.   Lopressor  [Metoprolol] Palpitations   Niacin Other (See Comments) and Rash    Flushing   Ofloxacin Rash and Nausea Only     Home Medications  Prior to Admission medications   Medication Sig Start Date End Date Taking? Authorizing Provider  acetaminophen (TYLENOL) 500 MG tablet Take 500 mg by mouth. In the PM and as needed    [provider]  acetaminophen (TYLENOL) 650 MG CR tablet Take 650 mg by mouth. 2 tablets in the AM and 1 tablet in the PM    [provider]  Alpha-D-Galactosidase (BEANO TO GO) TABS Take by mouth daily at 6 (six) AM. occasionally    [provider]  aspirin 81 MG tablet Take 81 mg by mouth daily.     [provider]  cholecalciferol (VITAMIN D) 1000 UNITS tablet Take 1,000 Units by mouth daily.     [provider]  Coenzyme Q10 (COQ10) 100 MG CAPS Take 200 mg by mouth daily.     [provider]  cyanocobalamin 1000 MCG tablet Take 1,000 mcg by mouth daily.     [provider]  famotidine (PEPCID) 20 MG tablet Take 20 mg by mouth 2 (two) times daily.    [provider]  ferrous sulfate 325 (65 FE) MG tablet Take 325 mg by mouth daily with breakfast.  06/07/11   [provider]  fluticasone (FLONASE) 50 MCG/ACT nasal spray  Place 2 sprays into both nostrils daily.    [provider]  folic acid (FOLVITE) 1 MG tablet Take 1 mg by mouth daily.     [provider]  furosemide (LASIX) 40 MG tablet Take 0.5 tablets (20 mg total) by mouth 2 (two) times daily. 11/15/22   Erasmo Downer, MD  ketotifen (ZADITOR) 0.025 % ophthalmic solution Place 1  drop into both eyes as needed.    [provider]  loratadine (CLARITIN) 10 MG tablet Take 10 mg by mouth daily.  06/07/11   [provider]  MELATONIN EXTRA STRENGTH PO Take 0.25 tablets by mouth at bedtime as needed.     [provider]  Multiple Vitamins-Minerals ([employer] SPECTRAVITE ADULT 50+ PO) Geneticist, molecular - Historical Medication  daily Active    [provider]  pantoprazole (PROTONIX) 40 MG tablet Take 1 tablet (40 mg total) by mouth 2 (two) times daily. 11/15/22   Bacigalupo, Marzella Schlein, MD  Polyethyl Glycol-Propyl Glycol 0.4-0.3 % SOLN Apply to eye as needed.    [provider]  polyethylene glycol (MIRALAX / GLYCOLAX) packet Take 17 g by mouth daily.    [provider]  potassium chloride SA (KLOR-CON M) 20 MEQ tablet Take 1 tablet (20 mEq total) by mouth daily. 11/15/22   Erasmo Downer, MD  Probiotic Product ([employer] SENIOR PROBIOTIC) CAPS Take by mouth daily.     [provider]  propranolol (INDERAL) 40 MG tablet Take 0.5 tablets (20 mg total) by mouth 2 (two) times daily. 11/15/22   Bacigalupo, Marzella Schlein, MD  Psyllium Husk POWD Take by mouth daily.     [provider]  pyridoxine (B-6) 100 MG tablet Take 100 mg by mouth daily.     [provider]  rosuvastatin (CRESTOR) 5 MG tablet Take 1 pill PO daily 5 times weekly. 11/15/22   Erasmo Downer, MD     Critical care time: 68 minutes       Betsey Holiday, AGACNP-BC Acute Care Nurse Practitioner [facility]   [phone] / [phone] Please see Amion for  details.

## 2023-05-26 NOTE — Consult Note (Signed)
PHARMACY CONSULT NOTE - ELECTROLYTES  Pharmacy Consult for Electrolyte Monitoring and Replacement   Recent Labs: Height: 4\' 8"  (142.2 cm) Weight: 53.8 kg (118 lb 9.7 oz) IBW/kg (Calculated) : 36.3 Estimated Creatinine Clearance: 19.2 mL/min (A) (by C-G formula based on SCr of 1.49 mg/dL (H)). Potassium (mmol/L)  Date Value  05/26/2023 4.2  01/09/2014 4.3   Magnesium (mg/dL)  Date Value  16/07/9603 2.4   Calcium (mg/dL)  Date Value  54/06/8118 9.1   Calcium, Total (mg/dL)  Date Value  14/78/2956 8.4 (L)   Albumin (g/dL)  Date Value  21/30/8657 3.4 (L)  05/16/2023 4.7   Phosphorus (mg/dL)  Date Value  84/69/6295 4.5   Sodium (mmol/L)  Date Value  05/26/2023 138  05/16/2023 138  05/13/2013 142   Corrected Ca: 8.88 mg/dL  Assessment  Sheila Kim is a 82 y.o. female presenting with near syncopal episode. PMH significant for CAD s/p CABG x4, HTN, HLD, anemia, diverticulitis, osteoarthritis, osteoporosis. Pharmacy has been consulted to monitor and replace electrolytes.  Diet: NPO, sips with meds MIVF: LR @ 50 mL/hr Pertinent medications: n/a  Goal of Therapy: Electrolytes WNL  Plan:  No replacement currently indicated Check BMP, Mg, Phos with AM labs  Thank you for allowing pharmacy to be a part of this patient's care.  Bettey Costa, PharmD Clinical Pharmacist 05/26/2023 8:20 AM

## 2023-05-26 NOTE — ED Notes (Signed)
Multiple times HR on monitor read 58-59; monitor showing poor reading. Zoll checked and was capturing; hr 60.

## 2023-05-26 NOTE — Progress Notes (Signed)
eLink Physician-Brief Progress Note Patient Name: Sheila Kim DOB: [birth year] MRN: [id number]   Date of Service  05/26/2023  HPI/Events of Note  Patient admitted with symptomatic bradycardia (near syncope) due to complete heart block, she is s/p temporary transvenous pacemaker placement by cardiology.  eICU Interventions  New Patient Evaluation.         U  05/26/2023, 1:24 AM

## 2023-05-26 NOTE — Plan of Care (Signed)
  Problem: Education: Goal: Knowledge of General Education information will improve Description Including pain rating scale, medication(s)/side effects and non-pharmacologic comfort measures Outcome: Progressing   

## 2023-05-27 ENCOUNTER — Inpatient Hospital Stay: Payer: Medicare Other

## 2023-05-27 ENCOUNTER — Encounter: Payer: Self-pay | Admitting: Internal Medicine

## 2023-05-27 ENCOUNTER — Encounter
Admission: EM | Disposition: A | Discharge status: Home / Self Care | Payer: Self-pay | Source: Home / Self Care | Attending: Student in an Organized Health Care Education/Training Program

## 2023-05-27 ENCOUNTER — Other Ambulatory Visit: Payer: Medicare Other

## 2023-05-27 DIAGNOSIS — I442 Atrioventricular block, complete: Secondary | ICD-10-CM | POA: Diagnosis not present

## 2023-05-27 DIAGNOSIS — I952 Hypotension due to drugs: Secondary | ICD-10-CM | POA: Diagnosis not present

## 2023-05-27 HISTORY — PX: PACEMAKER IMPLANT: EP1218

## 2023-05-27 LAB — CBC
HCT: 39.3 % (ref 36.0–46.0)
Hemoglobin: 13.2 g/dL (ref 12.0–15.0)
MCH: 32.8 pg (ref 26.0–34.0)
MCHC: 33.6 g/dL (ref 30.0–36.0)
MCV: 97.8 fL (ref 80.0–100.0)
Platelets: 235 10*3/uL (ref 150–400)
RBC: 4.02 MIL/uL (ref 3.87–5.11)
RDW: 12.6 % (ref 11.5–15.5)
WBC: 11.4 10*3/uL — ABNORMAL HIGH (ref 4.0–10.5)
nRBC: 0 % (ref 0.0–0.2)

## 2023-05-27 LAB — THYROID PANEL WITH TSH
Free Thyroxine Index: 1.9 (ref 1.2–4.9)
T3 Uptake Ratio: 34 % (ref 24–39)
T4, Total: 5.6 ug/dL (ref 4.5–12.0)
TSH: 1.1 u[IU]/mL (ref 0.450–4.500)

## 2023-05-27 LAB — GLUCOSE, CAPILLARY
Glucose-Capillary: 106 mg/dL — ABNORMAL HIGH (ref 70–99)
Glucose-Capillary: 108 mg/dL — ABNORMAL HIGH (ref 70–99)
Glucose-Capillary: 110 mg/dL — ABNORMAL HIGH (ref 70–99)
Glucose-Capillary: 110 mg/dL — ABNORMAL HIGH (ref 70–99)
Glucose-Capillary: 119 mg/dL — ABNORMAL HIGH (ref 70–99)
Glucose-Capillary: 174 mg/dL — ABNORMAL HIGH (ref 70–99)
Glucose-Capillary: 186 mg/dL — ABNORMAL HIGH (ref 70–99)

## 2023-05-27 LAB — RENAL FUNCTION PANEL
Albumin: 3.2 g/dL — ABNORMAL LOW (ref 3.5–5.0)
Anion gap: 9 (ref 5–15)
BUN: 14 mg/dL (ref 8–23)
CO2: 22 mmol/L (ref 22–32)
Calcium: 8.8 mg/dL — ABNORMAL LOW (ref 8.9–10.3)
Chloride: 106 mmol/L (ref 98–111)
Creatinine, Ser: 0.91 mg/dL (ref 0.44–1.00)
GFR, Estimated: >60 mL/min (ref >60)
Glucose, Bld: 110 mg/dL — ABNORMAL HIGH (ref 70–99)
Phosphorus: 2.9 mg/dL (ref 2.5–4.6)
Potassium: 3.9 mmol/L (ref 3.5–5.1)
Sodium: 137 mmol/L (ref 135–145)

## 2023-05-27 LAB — SURGICAL PCR SCREEN
MRSA, PCR: NEGATIVE
Staphylococcus aureus: NEGATIVE

## 2023-05-27 LAB — LYME DISEASE SEROLOGY W/REFLEX: Lyme Total Antibody EIA: NEGATIVE

## 2023-05-27 LAB — MAGNESIUM: Magnesium: 2.1 mg/dL (ref 1.7–2.4)

## 2023-05-27 LAB — TROPONIN I (HIGH SENSITIVITY): Troponin I (High Sensitivity): 712 ng/L (ref <18)

## 2023-05-27 SURGERY — PACEMAKER IMPLANT
Anesthesia: Moderate Sedation

## 2023-05-27 MED ORDER — MIDAZOLAM HCL 2 MG/2ML IJ SOLN
INTRAMUSCULAR | Status: AC
  Filled 2023-05-27: qty 2

## 2023-05-27 MED ORDER — METOPROLOL SUCCINATE ER 25 MG PO TB24
12.5000 mg | ORAL_TABLET | Freq: Every day | ORAL | Status: DC
  Filled 2023-05-27: qty 0.5

## 2023-05-27 MED ORDER — SODIUM CHLORIDE 0.9 % IV SOLN: 250.0000 mL | INTRAVENOUS | Status: DC | PRN

## 2023-05-27 MED ORDER — FUROSEMIDE 10 MG/ML IJ SOLN
40.0000 mg | Freq: Once | INTRAMUSCULAR | Status: AC
  Administered 2023-05-27: 40 mg via INTRAVENOUS

## 2023-05-27 MED ORDER — HEPARIN (PORCINE) IN NACL 1000-0.9 UT/500ML-% IV SOLN
INTRAVENOUS | Status: AC
  Filled 2023-05-27: qty 500

## 2023-05-27 MED ORDER — MIDAZOLAM HCL 2 MG/2ML IJ SOLN
INTRAMUSCULAR | Status: DC | PRN
  Administered 2023-05-27: .5 mg via INTRAVENOUS

## 2023-05-27 MED ORDER — FUROSEMIDE 10 MG/ML IJ SOLN
INTRAMUSCULAR | Status: AC
  Filled 2023-05-27: qty 4

## 2023-05-27 MED ORDER — METOPROLOL TARTRATE 5 MG/5ML IV SOLN
INTRAVENOUS | Status: DC | PRN
  Administered 2023-05-27: 5 mg via INTRAVENOUS

## 2023-05-27 MED ORDER — HYDRALAZINE HCL 20 MG/ML IJ SOLN
INTRAMUSCULAR | Status: AC
  Filled 2023-05-27: qty 1

## 2023-05-27 MED ORDER — LIDOCAINE HCL (PF) 1 % IJ SOLN
INTRAMUSCULAR | Status: DC | PRN
  Administered 2023-05-27: 30 mL

## 2023-05-27 MED ORDER — APIXABAN 2.5 MG PO TABS
2.5000 mg | ORAL_TABLET | Freq: Two times a day (BID) | ORAL | Status: DC
  Administered 2023-05-28 – 2023-05-29 (×3): 2.5 mg via ORAL
  Filled 2023-05-27 (×3): qty 1

## 2023-05-27 MED ORDER — FENTANYL CITRATE (PF) 100 MCG/2ML IJ SOLN
INTRAMUSCULAR | Status: DC | PRN
  Administered 2023-05-27: 12.5 ug via INTRAVENOUS

## 2023-05-27 MED ORDER — FENTANYL CITRATE (PF) 100 MCG/2ML IJ SOLN
INTRAMUSCULAR | Status: AC
  Filled 2023-05-27: qty 2

## 2023-05-27 MED ORDER — HYDRALAZINE HCL 20 MG/ML IJ SOLN
10.0000 mg | INTRAMUSCULAR | Status: DC | PRN
  Administered 2023-05-27: 10 mg via INTRAVENOUS

## 2023-05-27 MED ORDER — HEPARIN SODIUM (PORCINE) 1000 UNIT/ML IJ SOLN
INTRAMUSCULAR | Status: AC
  Filled 2023-05-27: qty 10

## 2023-05-27 MED ORDER — ACETAMINOPHEN 325 MG PO TABS: 650.0000 mg | ORAL_TABLET | ORAL | Status: DC | PRN

## 2023-05-27 MED ORDER — AMLODIPINE BESYLATE 5 MG PO TABS: 5.0000 mg | ORAL_TABLET | Freq: Every day | ORAL | Status: DC

## 2023-05-27 MED ORDER — METOPROLOL SUCCINATE ER 25 MG PO TB24
12.5000 mg | ORAL_TABLET | Freq: Every day | ORAL | Status: DC
  Administered 2023-05-27 – 2023-05-29 (×3): 12.5 mg via ORAL
  Filled 2023-05-27 (×3): qty 0.5

## 2023-05-27 MED ORDER — SODIUM CHLORIDE 0.9% FLUSH
3.0000 mL | Freq: Two times a day (BID) | INTRAVENOUS | Status: DC
  Administered 2023-05-27 – 2023-05-29 (×5): 3 mL via INTRAVENOUS

## 2023-05-27 MED ORDER — HEPARIN SODIUM (PORCINE) 1000 UNIT/ML IJ SOLN
INTRAMUSCULAR | Status: DC | PRN
  Administered 2023-05-27: 3000 [IU] via INTRAVENOUS

## 2023-05-27 MED ORDER — IOHEXOL 300 MG/ML  SOLN
INTRAMUSCULAR | Status: DC | PRN
  Administered 2023-05-27: 9 mg

## 2023-05-27 MED ORDER — LIDOCAINE HCL 1 % IJ SOLN
INTRAMUSCULAR | Status: AC
  Filled 2023-05-27: qty 20

## 2023-05-27 MED ORDER — HEPARIN (PORCINE) IN NACL 1000-0.9 UT/500ML-% IV SOLN
INTRAVENOUS | Status: DC | PRN
  Administered 2023-05-27 (×2): 500 mL

## 2023-05-27 MED ORDER — METOPROLOL TARTRATE 5 MG/5ML IV SOLN
INTRAVENOUS | Status: AC
  Filled 2023-05-27: qty 5

## 2023-05-27 MED ORDER — SODIUM CHLORIDE 0.9% FLUSH: 3.0000 mL | INTRAVENOUS | Status: DC | PRN

## 2023-05-27 MED ORDER — ONDANSETRON HCL 4 MG/2ML IJ SOLN: 4.0000 mg | Freq: Four times a day (QID) | INTRAMUSCULAR | Status: DC | PRN

## 2023-05-27 SURGICAL SUPPLY — 16 items
DILATOR VESSEL 38 20CM 12FR (INTRODUCER) IMPLANT
DILATOR VESSEL 38 20CM 14FR (INTRODUCER) IMPLANT
DILATOR VESSEL 38 20CM 18FR (INTRODUCER) IMPLANT
DILATOR VESSEL 38 20CM 8FR (INTRODUCER) IMPLANT
KIT SYRINGE INJ CVI SPIKEX1 (MISCELLANEOUS) IMPLANT
MICRA INTRODUCER SHEATH (SHEATH) ×1
NDL PERC 18GX7CM (NEEDLE) IMPLANT
NEEDLE PERC 18GX7CM (NEEDLE) ×1
PACEMAKER LEADLESS AV2 MICRA (Pacemaker) IMPLANT
PAD ELECT DEFIB RADIOL ZOLL (MISCELLANEOUS) IMPLANT
PROTECTION STATION PRESSURIZED (MISCELLANEOUS) ×1
SHEATH AVANTI 7FRX11 (SHEATH) IMPLANT
SHEATH INTRODUCER MICRA (SHEATH) IMPLANT
STATION PROTECTION PRESSURIZED (MISCELLANEOUS) IMPLANT
TRAY PACEMAKER INSERTION (PACKS) ×1 IMPLANT
WIRE AMPLATZ SS-J .035X180CM (WIRE) IMPLANT

## 2023-05-27 NOTE — Plan of Care (Signed)
  Problem: Education: Goal: Knowledge of General Education information will improve Description: Including pain rating scale, medication(s)/side effects and non-pharmacologic comfort measures Outcome: Progressing   Problem: Health Behavior/Discharge Planning: Goal: Ability to manage health-related needs will improve Outcome: Progressing   Problem: Clinical Measurements: Goal: Ability to maintain clinical measurements within normal limits will improve Outcome: Progressing Goal: Will remain free from infection Outcome: Progressing Goal: Diagnostic test results will improve Outcome: Progressing Goal: Respiratory complications will improve Outcome: Progressing Goal: Cardiovascular complication will be avoided Outcome: Progressing   Problem: Activity: Goal: Risk for activity intolerance will decrease Outcome: Progressing   Problem: Nutrition: Goal: Adequate nutrition will be maintained Outcome: Progressing   Problem: Coping: Goal: Level of anxiety will decrease Outcome: Progressing   Problem: Elimination: Goal: Will not experience complications related to bowel motility Outcome: Progressing Goal: Will not experience complications related to urinary retention Outcome: Progressing   Problem: Pain Managment: Goal: General experience of comfort will improve Outcome: Progressing   Problem: Safety: Goal: Ability to remain free from injury will improve Outcome: Progressing   Problem: Skin Integrity: Goal: Risk for impaired skin integrity will decrease Outcome: Progressing   Problem: Education: Goal: Knowledge of General Education information will improve Description: Including pain rating scale, medication(s)/side effects and non-pharmacologic comfort measures Outcome: Progressing   Problem: Health Behavior/Discharge Planning: Goal: Ability to manage health-related needs will improve Outcome: Progressing   Problem: Clinical Measurements: Goal: Ability to maintain  clinical measurements within normal limits will improve Outcome: Progressing   Problem: Clinical Measurements: Goal: Will remain free from infection Outcome: Progressing   Problem: Nutrition: Goal: Adequate nutrition will be maintained Outcome: Progressing   Problem: Skin Integrity: Goal: Risk for impaired skin integrity will decrease Outcome: Progressing   Problem: Education: Goal: Knowledge of cardiac device and self-care will improve Outcome: Progressing   Problem: Education: Goal: Ability to safely manage health related needs after discharge will improve Outcome: Progressing   Problem: Cardiac: Goal: Ability to achieve and maintain adequate cardiopulmonary perfusion will improve Outcome: Progressing

## 2023-05-27 NOTE — Progress Notes (Signed)
PROGRESS NOTE  SMITA HAUGH    DOB: [birth year], 82 y.o.  IRJ:[id number]    Code Status: Full Code   DOA: 05/25/2023   LOS: 1   Brief hospital course  Sheila Kim is a 82 y.o. 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  They presented from home to the ED on 05/25/2023 with weakness x 1 days.   In the ED, it was found that they had WBC 11.4, hgb 13, platelets 260. Unremarkable metabolic panel exception mildly elevated Cr of 1.85 from baseline 1.2.  Significant findings included HR in the 20s and complete heart block on ECG. She required oxygen supplementation on presentation.   They were initially treated with transvenous pacemaker and home BB held. Cardiology was consulted  Patient was admitted to medicine service for further workup and management of heart block as outlined in detail below.  05/27/23 -permanent pacemaker placed today. Hypertensive during procedure so was given PRN antihypertensives and then had episode of hypotension after procedure.  Assessment & Plan  Principal Problem:   Complete heart block (HCC) Active Problems:   AKI (acute kidney injury) (HCC)  Complete Heart Block s/p permanent pacer placed 8/16 PMHx: HTN, CAD s/p CABG x 4 (2014), HLD, prior MI - continuous cardiac monitoring - hold propanolol & lasix at this time - Cardiology following, appreciate input - eliquis - echo   Acute Kidney Injury- resolved Baseline Cr: 1.20, Cr on admission:1.85 - gentle IVF hydration  - Daily BMP, replace electrolytes PRN - Avoid nephrotoxic agents as able, ensure adequate renal perfusion   Leukocytosis Suspect reactive - daily CBC, monitor WBC/fever curve   Hyperglycemia Recent Hemoglobin A1C: 6.0 - monitor glucose on daily labs  Body mass index is 28.07 kg/m.  VTE ppx: heparin injection 5,000 Units Start: 05/26/23 1115 SCDs Start: 05/26/23 0057 Place TED hose Start: 05/26/23  0057   Diet:     Diet   Diet NPO time specified Except for: Sips with Meds   Consultants: Cardiology  PCCM  Subjective 05/27/23    Pt reports feeling improved right now. She had shortness of breath following her pacemaker placement and felt :out of it" currently having some soreness at the site of the temp pacemaker leads but denies chest pain. No LE swelling   Objective   Vitals:   05/27/23 0400 05/27/23 0500 05/27/23 0600 05/27/23 0700  BP: (!) 147/67 (!) 150/75 (!) 166/74 (!) 165/79  Pulse: (!) 59 (!) 59 (!) 59 (!) 59  Resp: 19 (!) 23 (!) 22 (!) 28  Temp: 98.5 F (36.9 C)     TempSrc: Oral     SpO2: 97% 97% 97% 95%  Weight:  56.8 kg    Height:        Intake/Output Summary (Last 24 hours) at 05/27/2023 0815 Last data filed at 05/27/2023 0700 Gross per 24 hour  Intake 1489 ml  Output 750 ml  Net 739 ml   Filed Weights   05/26/23 0111 05/27/23 0500  Weight: 53.8 kg 56.8 kg     Physical Exam:  General: awake, alert, NAD HEENT: atraumatic, clear conjunctiva, anicteric sclera, MMM, hearing grossly normal Respiratory: normal respiratory effort. Cardiovascular: quick capillary refill, normal S1/S2, RRR, no JVD, murmurs Gastrointestinal: soft, NT, ND Nervous: A&O x3. no gross focal neurologic deficits, normal speech Extremities: moves all equally, no edema, normal tone Skin: dry, intact, normal temperature, normal color. No rashes, lesions  or ulcers on exposed skin Psychiatry: normal mood, congruent affect  Labs   I have personally reviewed the following labs and imaging studies CBC    Component Value Date/Time   WBC 11.4 (H) 05/27/2023 0608   RBC 4.02 05/27/2023 0608   HGB 13.2 05/27/2023 0608   HGB 14.6 10/16/2020 1608   HCT 39.3 05/27/2023 0608   HCT 42.1 10/16/2020 1608   PLT 235 05/27/2023 0608   PLT 318 10/16/2020 1608   MCV 97.8 05/27/2023 0608   MCV 97 10/16/2020 1608   MCV 96 05/13/2013 0452   MCH 32.8 05/27/2023 0608   MCHC 33.6 05/27/2023 0608    RDW 12.6 05/27/2023 0608   RDW 12.1 10/16/2020 1608   RDW 13.6 05/13/2013 0452   LYMPHSABS 2.4 10/16/2020 1608   LYMPHSABS 2.3 05/13/2013 0452   MONOABS 1.0 (H) 05/13/2013 0452   EOSABS 0.3 10/16/2020 1608   EOSABS 0.3 05/13/2013 0452   BASOSABS 0.1 10/16/2020 1608   BASOSABS 0.1 05/13/2013 0452      Latest Ref Rng & Units 05/27/2023    6:08 AM 05/26/2023    4:40 AM 05/25/2023   10:39 PM  BMP  Glucose 70 - 99 mg/dL 161  096  045   BUN 8 - 23 mg/dL 14  26  30    Creatinine 0.44 - 1.00 mg/dL 4.09  8.11  9.14   Sodium 135 - 145 mmol/L 137  138  134   Potassium 3.5 - 5.1 mmol/L 3.9  4.2  4.9   Chloride 98 - 111 mmol/L 106  105  98   CO2 22 - 32 mmol/L 22  25  20    Calcium 8.9 - 10.3 mg/dL 8.8  9.1  9.7     CARDIAC CATHETERIZATION  Result Date: 05/26/2023   Anticipated discharge date to be determined.   No indication for antiplatelet therapy at this time . Conclusion Successful placement of temporary pacemaker to right ventricle for high-grade AV block Patient had excellent capture excellent position pacer sutured in place patient transferred to ICU   CT CHEST WO CONTRAST  Result Date: 05/26/2023 CLINICAL DATA:  Evaluate for pleural effusion. Recent cardiac catheterization. Abnormal chest radiograph. EXAM: CT CHEST WITHOUT CONTRAST TECHNIQUE: Multidetector CT imaging of the chest was performed following the standard protocol without IV contrast. RADIATION DOSE REDUCTION: This exam was performed according to the departmental dose-optimization program which includes automated exposure control, adjustment of the mA and/or kV according to patient size and/or use of iterative reconstruction technique. COMPARISON:  Chest radiograph from 05/11/2013 FINDINGS: Cardiovascular: Previous median sternotomy and CABG procedure. Aortic atherosclerosis. No pericardial effusion. Heart size is upper limits of normal. Mediastinum/Nodes: Thyroid gland, trachea, and esophagus are unremarkable. No mediastinal  adenopathy. No axillary or supraclavicular adenopathy. The hilar lymph nodes are suboptimally visualized secondary to lack of IV contrast. Lungs/Pleura: Mild pleuroparenchymal scarring is noted overlying the posterior right lower lobe. There is been interval atelectasis of the basilar left lower lobe with associated left lung volume loss and marked asymmetric elevation of the left hemidiaphragm. No obvious obstructing mass identified within the left lower lobe airway. Scarring versus subsegmental atelectasis noted in the anterior basal left upper lobe. Irregular nodule within the periphery of the left upper lobe measures 4 mm, image 51/4. Upper Abdomen: No acute abnormality identified within the imaged portions of the upper abdomen. There is left renal cortical scarring and volume loss. Prominent left extrarenal pelvis is noted. Aortic atherosclerotic calcifications. Too small to characterize low-density structure within  the lateral segment of left lobe measures 7 mm, image 146/2. Musculoskeletal: No acute or suspicious findings. Mild degenerative disc disease noted within the thoracic spine. IMPRESSION: 1. Interval atelectasis of the basilar left lower lobe with associated left lung volume loss and marked asymmetric elevation of the left hemidiaphragm. No obvious obstructing mass identified within the left lower lobe airway. Recommend follow-up imaging to ensure re-expansion of the left lower lobe and to exclude un underlying obstructing lesion. 2. Irregular nodule within the periphery of the left upper lobe measures 4 mm. If the patient is at high risk for bronchogenic carcinoma, follow-up chest CT at 1year is recommended. If the patient is at low risk, no follow-up is needed. This recommendation follows the consensus statement: Guidelines for Management of Small Pulmonary Nodules Detected on CT Scans: A Statement from the Fleischner Society as published in Radiology 2005; 237:395-400. 3.  Aortic Atherosclerosis  (ICD10-I70.0). Electronically Signed   By: Signa Kell M.D.   On: 05/26/2023 13:52   DG Chest Port 1 View  Result Date: 05/26/2023 CLINICAL DATA:  Heart block,  recent cardiac catheterization EXAM: PORTABLE CHEST 1 VIEW COMPARISON:  05/11/2013 FINDINGS: External pad overlies enlarged cardiac silhouette. Moderate LEFT effusion noted. Elevation of the LEFT hemidiaphragm additionally. RIGHT lung clear. Midline sternotomy no pneumothorax IMPRESSION: Elevation of the LEFT hemidiaphragm with associated atelectasis and effusion. Electronically Signed   By: Genevive Bi M.D.   On: 05/26/2023 09:50    Disposition Plan & Communication  Patient status: Inpatient  Admitted From: Home Planned disposition location: Home Anticipated discharge date: 8/19 pending clinical stability  Family Communication: none at bedside    Author: Leeroy Bock, DO [facility] 05/27/2023, 8:15 AM   Available by Epic secure chat 7AM-7PM. If 7PM-7AM, please contact night-coverage.  [facility] contact information found on [email or web address].

## 2023-05-27 NOTE — Plan of Care (Signed)
  Problem: Education: Goal: Knowledge of General Education information will improve Description: Including pain rating scale, medication(s)/side effects and non-pharmacologic comfort measures Outcome: Progressing   Problem: Health Behavior/Discharge Planning: Goal: Ability to manage health-related needs will improve Outcome: Progressing   Problem: Clinical Measurements: Goal: Ability to maintain clinical measurements within normal limits will improve Outcome: Progressing Goal: Will remain free from infection Outcome: Progressing Goal: Diagnostic test results will improve Outcome: Progressing Goal: Respiratory complications will improve Outcome: Progressing Goal: Cardiovascular complication will be avoided Outcome: Progressing   Problem: Activity: Goal: Risk for activity intolerance will decrease Outcome: Progressing   Problem: Nutrition: Goal: Adequate nutrition will be maintained Outcome: Progressing   Problem: Coping: Goal: Level of anxiety will decrease Outcome: Progressing   Problem: Elimination: Goal: Will not experience complications related to bowel motility Outcome: Progressing Goal: Will not experience complications related to urinary retention Outcome: Progressing   Problem: Pain Managment: Goal: General experience of comfort will improve Outcome: Progressing   Problem: Safety: Goal: Ability to remain free from injury will improve Outcome: Progressing   Problem: Skin Integrity: Goal: Risk for impaired skin integrity will decrease Outcome: Progressing   Problem: Education: Goal: Knowledge of General Education information will improve Description: Including pain rating scale, medication(s)/side effects and non-pharmacologic comfort measures Outcome: Progressing   Problem: Health Behavior/Discharge Planning: Goal: Ability to manage health-related needs will improve Outcome: Progressing   Problem: Clinical Measurements: Goal: Ability to maintain  clinical measurements within normal limits will improve Outcome: Progressing Goal: Will remain free from infection Outcome: Progressing   Problem: Nutrition: Goal: Adequate nutrition will be maintained Outcome: Progressing   Problem: Skin Integrity: Goal: Risk for impaired skin integrity will decrease Outcome: Progressing   Problem: Education: Goal: Knowledge of cardiac device and self-care will improve Outcome: Progressing Goal: Ability to safely manage health related needs after discharge will improve Outcome: Progressing Goal: Individualized Educational Video(s) Outcome: Progressing   Problem: Cardiac: Goal: Ability to achieve and maintain adequate cardiopulmonary perfusion will improve Outcome: Progressing

## 2023-05-27 NOTE — Progress Notes (Signed)
Patient returned from pacemaker placement belly-breathing. MD made aware. Lasix given. Stat chest x-ray ordered. Awaiting new orders.  05/27/23 10:51 AM Lorrin Goodell RN

## 2023-05-27 NOTE — Progress Notes (Cosign Needed Addendum)
[facility] CLINIC CARDIOLOGY CONSULT NOTE       Patient ID: Sheila Kim MRN: [id number] DOB/AGE: January 27, 1941 82 y.o.  Admit date: 05/25/2023 Referring Physician Dr. Corena Herter Primary Physician Dr. Shirlee Latch  Primary Cardiologist Dr. Darrold Junker Reason for Consultation complete heart block   HPI: Sheila Kim is an 82yoF with a PMH of CAD s/p CABG x 4 (LIMA to D1/LAD, SVG to D2, PDA (05/17/2013), HTN, HLD, statin intolerance, who presented to [facility] ED 05/26/2023 with generalized weakness, dizziness.  In the ED, she was alert and able to provide a history, manual BP in triage was 34/14 and HR in the 20s.  Reportedly in complete heart block.  She was externally paced until emergent right femoral temporary transvenous pacemaker was placed overnight on 8/15 by Dr. Juliann Pares.  Interval history:  -Patient underwent Micra pacemaker placement this morning with Dr. Darrold Junker -Post-procedurally developed shortness of breath, home Lasix had been held since admission.  Received IV Lasix 40 mg x 1 in specials recovery. -BP elevated post procedure, received PRN hydralazine.  BP when she arrived back up to the unit low at 92/52 with quick recovery back to 120s over 60s on recheck. -Continues to experience shortness of breath, states she just feels "off" after arrival back up to the ICU. States she has chest tightness that she has had for "a while" and feels this is stable.    Review of systems complete and found to be negative unless listed above     Past Medical History:  Diagnosis Date   Anemia    history   Arthritis    hands, spine   Chronic kidney disease    reports one kidney "doesn't work well"   GERD (gastroesophageal reflux disease)    Hypertension    Motion sickness    boats   Seasonal allergies     Past Surgical History:  Procedure Laterality Date   ABDOMINAL HYSTERECTOMY     BACK SURGERY     BREAST CYST ASPIRATION Left    COLONOSCOPY WITH PROPOFOL N/A  10/03/2015   Procedure: COLONOSCOPY WITH PROPOFOL;  Surgeon: Midge Minium, MD;  Location: [facility];  Service: Endoscopy;  Laterality: N/A;   CORONARY ARTERY BYPASS GRAFT  05/17/2013   quadruple   DILATION AND CURETTAGE OF UTERUS     EYE SURGERY Bilateral 2015   cataract   NOSE SURGERY  1962   deviated septum, hemorrhaged- pt had to return to hospital   PACEMAKER IMPLANT N/A 05/27/2023   Procedure: PACEMAKER IMPLANT;  Surgeon: Marcina Millard, MD;  Location: [facility] INVASIVE CV LAB;  Service: Cardiovascular;  Laterality: N/A;   TEMPORARY PACEMAKER N/A 05/25/2023   Procedure: TEMPORARY PACEMAKER;  Surgeon: Alwyn Pea, MD;  Location: [facility] INVASIVE CV LAB;  Service: Cardiovascular;  Laterality: N/A;   TONSILLECTOMY  1944   ureteropelvic junction obstruction      Medications Prior to Admission  Medication Sig Dispense Refill Last Dose   acetaminophen (TYLENOL) 500 MG tablet Take 500 mg by mouth. In the PM and as needed   prn at prn   acetaminophen (TYLENOL) 650 MG CR tablet Take 650 mg by mouth. 2 tablets in the AM and 1 tablet in the PM   prn at prn   ASPERCREME LIDOCAINE EX Apply 1 Application topically as needed. Roll on. Use every day   prn at prn   aspirin 81 MG tablet Take 81 mg by mouth daily.    Unknown at Unknown   Calcium Magnesium  Zinc 333-133-5 MG TABS Take 1 tablet by mouth daily.   Unknown at Unknown   cholecalciferol (VITAMIN D) 1000 UNITS tablet Take 1,000 Units by mouth daily.    Unknown at Unknown   Coenzyme Q10 (COQ10) 100 MG CAPS Take 200 mg by mouth daily.    Unknown at Unknown   cyanocobalamin 1000 MCG tablet Take 1,000 mcg by mouth daily.    Unknown at Unknown   famotidine (PEPCID) 20 MG tablet Take 20 mg by mouth 2 (two) times daily.   Unknown at Unknown   ferrous sulfate 325 (65 FE) MG tablet Take 325 mg by mouth daily with breakfast.    Unknown at Unknown   fluticasone (FLONASE) 50 MCG/ACT nasal spray Place 2 sprays into both nostrils daily.    Unknown at Unknown   folic acid (FOLVITE) 1 MG tablet Take 1 mg by mouth daily.    Unknown at Unknown   furosemide (LASIX) 40 MG tablet Take 0.5 tablets (20 mg total) by mouth 2 (two) times daily. 100 tablet 3 Unknown at Unknown   ketotifen (ZADITOR) 0.025 % ophthalmic solution Place 1 drop into both eyes as needed.   prn at prn   loratadine (CLARITIN) 10 MG tablet Take 10 mg by mouth daily.    Unknown at Unknown   MELATONIN EXTRA STRENGTH PO Take 0.25 tablets by mouth at bedtime as needed.    prn at prn   pantoprazole (PROTONIX) 40 MG tablet Take 1 tablet (40 mg total) by mouth 2 (two) times daily. 200 tablet 3 Unknown at Unknown   Polyethyl Glycol-Propyl Glycol 0.4-0.3 % SOLN Apply to eye as needed.   prn at prn   polyethylene glycol (MIRALAX / GLYCOLAX) packet Take 17 g by mouth daily.   Unknown at Unknown   potassium chloride SA (KLOR-CON M) 20 MEQ tablet Take 1 tablet (20 mEq total) by mouth daily. 100 tablet 3 Unknown at Unknown   Probiotic Product ([employer] SENIOR PROBIOTIC) CAPS Take by mouth daily.    Unknown at Unknown   propranolol (INDERAL) 40 MG tablet Take 0.5 tablets (20 mg total) by mouth 2 (two) times daily. 100 tablet 3 Unknown at Unknown   Psyllium Husk POWD Take by mouth daily.    Unknown at Unknown   pyridoxine (B-6) 100 MG tablet Take 100 mg by mouth daily.    Unknown at Unknown   rosuvastatin (CRESTOR) 5 MG tablet Take 1 pill PO daily 5 times weekly. 65 tablet 3 Unknown at Unknown   Alpha-D-Galactosidase (BEANO TO GO) TABS Take by mouth daily at 6 (six) AM. occasionally (Patient not taking: Reported on 05/26/2023)   Not Taking   Multiple Vitamins-Minerals (CVS SPECTRAVITE ADULT 50+ PO) Spectravite Ultra Women's Health Senior - Historical Medication  daily Active   Unknown at Unknown   Social History   Socioeconomic History   Marital status: Widowed    Spouse name: Daphine Deutscher   Number of children: 2   Years of education: 12   Highest education level: Some college, no degree   Occupational History   Occupation: retired  Tobacco Use   Smoking status: Never   Smokeless tobacco: Never  Vaping Use   Vaping status: Never Used  Substance and Sexual Activity   Alcohol use: No   Drug use: No   Sexual activity: Not on file  Other Topics Concern   Not on file  Social History Narrative   Not on file   Social Determinants of Corporate investment banker  Strain: Low Risk  (10/16/2020)   Overall Financial Resource Strain (CARDIA)    Difficulty of Paying Living Expenses: Not hard at all  Food Insecurity: No Food Insecurity (10/16/2020)   Hunger Vital Sign    Worried About Running Out of Food in the Last Year: Never true    Ran Out of Food in the Last Year: Never true  Transportation Needs: No Transportation Needs (10/16/2020)   PRAPARE - Administrator, Civil Service (Medical): No    Lack of Transportation (Non-Medical): No  Physical Activity: Sufficiently Active (10/16/2020)   Exercise Vital Sign    Days of Exercise per Week: 6 days    Minutes of Exercise per Session: 30 min  Stress: No Stress Concern Present (10/16/2020)   [employer] of Occupational Health - Occupational Stress Questionnaire    Feeling of Stress : Not at all  Social Connections: Moderately Isolated (10/16/2020)   Social Connection and Isolation Panel [NHANES]    Frequency of Communication with Friends and Family: More than three times a week    Frequency of Social Gatherings with Friends and Family: More than three times a week    Attends Religious Services: Never    Database administrator or Organizations: Yes    Attends Engineer, structural: More than 4 times per year    Marital Status: Widowed  Intimate Partner Violence: Not At Risk (10/16/2020)   Humiliation, Afraid, Rape, and Kick questionnaire    Fear of Current or Ex-Partner: No    Emotionally Abused: No    Physically Abused: No    Sexually Abused: No    Family History  Problem Relation Age of Onset   Heart  attack Mother        3 MI's   Diabetes Mother    Heart disease Mother    Diabetes Father    Heart attack Father    COPD Father    Stroke Brother    Diabetes Sister    Heart disease Sister        2 stents   Breast cancer Neg Hx       Intake/Output Summary (Last 24 hours) at 05/27/2023 1412 Last data filed at 05/27/2023 1100 Gross per 24 hour  Intake 1262.89 ml  Output 750 ml  Net 512.89 ml    Vitals:   05/27/23 1100 05/27/23 1200 05/27/23 1300 05/27/23 1400  BP: (!) 92/52 134/69 138/65 (!) 146/67  Pulse: 60 65 60 70  Resp: 15 (!) 24 (!) 23 17  Temp:  98.3 F (36.8 C)    TempSrc:  Oral    SpO2: 100% 100% 97% 96%  Weight:      Height:        PHYSICAL EXAM General: Lethargic elderly female, well nourished, in no acute distress.  Laying on an incline in hospital bed HEENT:  Normocephalic and atraumatic. Neck:  No JVD.  Lungs: Normal respiratory effort on room air. Clear bilaterally to auscultation. No wheezes, crackles, rhonchi.  Heart: V paced rhythm . Normal S1 and S2 without gallops or murmurs.  Abdomen: Non-distended appearing.  Msk: Normal strength and tone for age. Extremities: R groin with temp transvenous pacemaker wire. No peripheral edema.  Neuro: Alert and oriented X 3. Psych:  Answers questions appropriately.   Labs: Basic Metabolic Panel: Recent Labs    05/26/23 0440 05/27/23 0608  NA 138 137  K 4.2 3.9  CL 105 106  CO2 25 22  GLUCOSE 134* 110*  BUN  26* 14  CREATININE 1.49* 0.91  CALCIUM 9.1 8.8*  MG 2.4 2.1  PHOS 4.5 2.9   Liver Function Tests: Recent Labs    05/26/23 0440 05/27/23 0608  AST 66*  --   ALT 50*  --   ALKPHOS 42  --   BILITOT 0.9  --   PROT 6.2*  --   ALBUMIN 3.4* 3.2*   No results for input(s): "LIPASE", "AMYLASE" in the last 72 hours. CBC: Recent Labs    05/26/23 0440 05/27/23 0608  WBC 11.4* 11.4*  HGB 13.0 13.2  HCT 38.1 39.3  MCV 96.9 97.8  PLT 260 235   Cardiac Enzymes: Recent Labs    05/26/23 0933  05/26/23 1134 05/26/23 1325  TROPONINIHS 218* 207* 235*   BNP: No results for input(s): "BNP" in the last 72 hours. D-Dimer: No results for input(s): "DDIMER" in the last 72 hours. Hemoglobin A1C: No results for input(s): "HGBA1C" in the last 72 hours. Fasting Lipid Panel: No results for input(s): "CHOL", "HDL", "LDLCALC", "TRIG", "CHOLHDL", "LDLDIRECT" in the last 72 hours. Thyroid Function Tests: No results for input(s): "TSH", "T4TOTAL", "T3FREE", "THYROIDAB" in the last 72 hours.  Invalid input(s): "FREET3" Anemia Panel: No results for input(s): "VITAMINB12", "FOLATE", "FERRITIN", "TIBC", "IRON", "RETICCTPCT" in the last 72 hours.   Radiology: [facility] Chest Port 1 View  Result Date: 05/27/2023 CLINICAL DATA:  Shortness of breath.  History of hypertension. EXAM: PORTABLE CHEST 1 VIEW COMPARISON:  05/26/2023 FINDINGS: Stable cardiomediastinal contours. Previous median sternotomy and CABG procedure. Loop recorder noted in the projection of the left heart. Unchanged asymmetric elevation of the left hemidiaphragm with persistent left lower lobe atelectasis. Unchanged blunting of the left costophrenic angle. Mild increase interstitial markings noted compared with previous exam. No superimposed airspace disease. IMPRESSION: 1. Mild increase interstitial markings compared with previous exam. This may reflect mild interstitial edema. 2. Unchanged asymmetric elevation of the left hemidiaphragm with persistent atelectasis of the left lower lobe. Electronically Signed   By: Signa Kell M.D.   On: 05/27/2023 11:41   EP PPM/ICD IMPLANT  Result Date: 05/27/2023 Successful Micra AV2 leadless pacemaker implantation   CARDIAC CATHETERIZATION  Result Date: 05/26/2023   Anticipated discharge date to be determined.   No indication for antiplatelet therapy at this time . Conclusion Successful placement of temporary pacemaker to right ventricle for high-grade AV block Patient had excellent capture excellent  position pacer sutured in place patient transferred to ICU   CT CHEST WO CONTRAST  Result Date: 05/26/2023 CLINICAL DATA:  Evaluate for pleural effusion. Recent cardiac catheterization. Abnormal chest radiograph. EXAM: CT CHEST WITHOUT CONTRAST TECHNIQUE: Multidetector CT imaging of the chest was performed following the standard protocol without IV contrast. RADIATION DOSE REDUCTION: This exam was performed according to the departmental dose-optimization program which includes automated exposure control, adjustment of the mA and/or kV according to patient size and/or use of iterative reconstruction technique. COMPARISON:  Chest radiograph from 05/11/2013 FINDINGS: Cardiovascular: Previous median sternotomy and CABG procedure. Aortic atherosclerosis. No pericardial effusion. Heart size is upper limits of normal. Mediastinum/Nodes: Thyroid gland, trachea, and esophagus are unremarkable. No mediastinal adenopathy. No axillary or supraclavicular adenopathy. The hilar lymph nodes are suboptimally visualized secondary to lack of IV contrast. Lungs/Pleura: Mild pleuroparenchymal scarring is noted overlying the posterior right lower lobe. There is been interval atelectasis of the basilar left lower lobe with associated left lung volume loss and marked asymmetric elevation of the left hemidiaphragm. No obvious obstructing mass identified within the left lower lobe  airway. Scarring versus subsegmental atelectasis noted in the anterior basal left upper lobe. Irregular nodule within the periphery of the left upper lobe measures 4 mm, image 51/4. Upper Abdomen: No acute abnormality identified within the imaged portions of the upper abdomen. There is left renal cortical scarring and volume loss. Prominent left extrarenal pelvis is noted. Aortic atherosclerotic calcifications. Too small to characterize low-density structure within the lateral segment of left lobe measures 7 mm, image 146/2. Musculoskeletal: No acute or  suspicious findings. Mild degenerative disc disease noted within the thoracic spine. IMPRESSION: 1. Interval atelectasis of the basilar left lower lobe with associated left lung volume loss and marked asymmetric elevation of the left hemidiaphragm. No obvious obstructing mass identified within the left lower lobe airway. Recommend follow-up imaging to ensure re-expansion of the left lower lobe and to exclude un underlying obstructing lesion. 2. Irregular nodule within the periphery of the left upper lobe measures 4 mm. If the patient is at high risk for bronchogenic carcinoma, follow-up chest CT at 1year is recommended. If the patient is at low risk, no follow-up is needed. This recommendation follows the consensus statement: Guidelines for Management of Small Pulmonary Nodules Detected on CT Scans: A Statement from the Fleischner Society as published in Radiology 2005; 237:395-400. 3.  Aortic Atherosclerosis (ICD10-I70.0). Electronically Signed   By: Signa Kell M.D.   On: 05/26/2023 13:52   DG Chest Port 1 View  Result Date: 05/26/2023 CLINICAL DATA:  Heart block,  recent cardiac catheterization EXAM: PORTABLE CHEST 1 VIEW COMPARISON:  05/11/2013 FINDINGS: External pad overlies enlarged cardiac silhouette. Moderate LEFT effusion noted. Elevation of the LEFT hemidiaphragm additionally. RIGHT lung clear. Midline sternotomy no pneumothorax IMPRESSION: Elevation of the LEFT hemidiaphragm with associated atelectasis and effusion. Electronically Signed   By: Genevive Bi M.D.   On: 05/26/2023 09:50    ECHO 11/01/2016 INTERPRETATION  Normal Stress Echocardiogram  NORMAL RIGHT VENTRICULAR SYSTOLIC FUNCTION  MODERATE VALVULAR REGURGITATION (See above)  NO VALVULAR STENOSIS NOTED   ___________________________________________________________________________________________  Electronically signed by: MD Jamse Mead on 11/02/2016 09:01 AM              Performed By: Luretha Murphy, RDCS, RVT         Ordering Physician: Leanora Ivanoff   TELEMETRY reviewed by me [facility]) 05/27/2023: Ventricular paced rhythm  EKG reviewed by me: Ventricular paced rhythm with concern for underlying atrial flutter  Data reviewed by me [facility]) 05/27/2023: admission H&P, CV procedure note, hospitalist progress note nursing notes last 24h vitals tele labs imaging I/O   Principal Problem:   Complete heart block (HCC) Active Problems:   AKI (acute kidney injury) (HCC)    ASSESSMENT AND PLAN:  Sheila Kim is an 82yoF with a PMH of CAD s/p CABG x 4 (LIMA to D1/LAD, SVG to D2, PDA (05/17/2013), HTN, HLD, statin intolerance, who presented to [facility] ED 05/26/2023 with generalized weakness, dizziness.  In the ED, she was alert and able to provide a history, manual BP in triage was 34/14 and HR in the 20s.  Reportedly in complete heart block.  She was externally paced until emergent right femoral temporary transvenous pacemaker was placed overnight by Dr. Juliann Pares.  # Shortness of breath # Dizziness After pacemaker placement while in recovery she developed SOB, she has not received home lasix since admission and her weight did trend up this AM from yesterday. BP temporarily dropped to 92/52 after receiving IV lasix and IV prn hydralazine with quick recovery to 120/60s. -IV Lasix  40 mg x 1 given in specials recovery with good urine output.  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  She does take propranolol 20 mg twice daily for hypertension, but unlikely that this low dose of this medication is the cause of her CHB.  Clinically improved after temporary transvenous pacemaker was placed. Underlying rhythm CHB with atrial rate 75bpm. Discussed the risks and benefits of proceeding with either dual-chamber PPM versus Micra leadless PPM with the patient. She is agreeable  to proceed with [employer] Micra leadless PPM.  Underwent Micra leadless PPM placement on the morning of 8/16 without complications.  EKG done postprocedure given patient was complaining of shortness of breath, dizziness.  EKG reviewed by Dr. Juliann Pares, difficult to assess underlying rhythm but some concern that this is atrial flutter. -Start Eliquis 2.5 mg twice daily. -Echo completed ordered  # CAD s/p CABG x 4 (2014) Post-procedure today complaining of "chest tightness" which she reports she has had for "a while" and is stable. -Continue aspirin 81mg  daily and crestor 5mg  daily. Intolerant of higher intensity statins  # Hypertension Previously taking propranolol bid for BP control outpatient but this has been held since admission. BP after pacemaker implant 180-190s systolic.  -PRN hydralazine for SBP >180 mg daily.  -Will change home propranolol to metoprolol succinate.   This patient's plan of care was discussed and created with Dr. Juliann Pares and he is in agreement.  Signed: Gale Journey , PA-C 05/27/2023, 2:12 PM [facility] Cardiology

## 2023-05-27 NOTE — Discharge Instructions (Addendum)
Pacemaker Discharge Instructions Please avoid showering until Sunday (05/29/2023) and do not submerge yourself in water (baths, oceans, swimming pools) for the next week. If your bandage gets wet or starts to fall off, replace it with another piece of gauze and the Tegaderm (clear bandage I provided). You should try to keep it covered for a week. You will follow up with Dr. Darrold Junker in the office in about 1 week. If you have bleeding from the site, lie down and apply firm pressure for 20 minutes, if it continues to bleed, call our office [phone]). Avoid heavy lifting, squatting (other than sitting) and strenuous activity for 1 week. You will get a call from [employer] to set up your pacemaker and the [employer] device. You do not need to bring this device with you to appointments. It is used to monitor your pacemaker from home.

## 2023-05-28 ENCOUNTER — Inpatient Hospital Stay: Admit: 2023-05-28 | Payer: Medicare Other

## 2023-05-28 DIAGNOSIS — Z950 Presence of cardiac pacemaker: Secondary | ICD-10-CM

## 2023-05-28 DIAGNOSIS — I442 Atrioventricular block, complete: Secondary | ICD-10-CM | POA: Diagnosis not present

## 2023-05-28 LAB — RENAL FUNCTION PANEL
Albumin: 3.1 g/dL — ABNORMAL LOW (ref 3.5–5.0)
Anion gap: 8 (ref 5–15)
BUN: 16 mg/dL (ref 8–23)
CO2: 24 mmol/L (ref 22–32)
Calcium: 8.6 mg/dL — ABNORMAL LOW (ref 8.9–10.3)
Chloride: 102 mmol/L (ref 98–111)
Creatinine, Ser: 0.91 mg/dL (ref 0.44–1.00)
GFR, Estimated: >60 mL/min (ref >60)
Glucose, Bld: 105 mg/dL — ABNORMAL HIGH (ref 70–99)
Phosphorus: 3.2 mg/dL (ref 2.5–4.6)
Potassium: 3 mmol/L — ABNORMAL LOW (ref 3.5–5.1)
Sodium: 134 mmol/L — ABNORMAL LOW (ref 135–145)

## 2023-05-28 LAB — CBC
HCT: 37.3 % (ref 36.0–46.0)
Hemoglobin: 13.2 g/dL (ref 12.0–15.0)
MCH: 33.2 pg (ref 26.0–34.0)
MCHC: 35.4 g/dL (ref 30.0–36.0)
MCV: 93.7 fL (ref 80.0–100.0)
Platelets: 216 10*3/uL (ref 150–400)
RBC: 3.98 MIL/uL (ref 3.87–5.11)
RDW: 12.9 % (ref 11.5–15.5)
WBC: 11 10*3/uL — ABNORMAL HIGH (ref 4.0–10.5)
nRBC: 0 % (ref 0.0–0.2)

## 2023-05-28 LAB — GLUCOSE, CAPILLARY
Glucose-Capillary: 130 mg/dL — ABNORMAL HIGH (ref 70–99)
Glucose-Capillary: 170 mg/dL — ABNORMAL HIGH (ref 70–99)
Glucose-Capillary: 90 mg/dL (ref 70–99)
Glucose-Capillary: 92 mg/dL (ref 70–99)

## 2023-05-28 MED ORDER — POLYETHYLENE GLYCOL 3350 17 G PO PACK
17.0000 g | PACK | Freq: Every day | ORAL | Status: DC
  Administered 2023-05-28 – 2023-05-29 (×2): 17 g via ORAL
  Filled 2023-05-28 (×2): qty 1

## 2023-05-28 MED ORDER — ROSUVASTATIN CALCIUM 10 MG PO TABS
5.0000 mg | ORAL_TABLET | ORAL | Status: DC
  Administered 2023-05-28: 5 mg via ORAL
  Filled 2023-05-28 (×2): qty 1

## 2023-05-28 MED ORDER — LORATADINE 10 MG PO TABS
10.0000 mg | ORAL_TABLET | Freq: Every day | ORAL | Status: DC
  Administered 2023-05-28 – 2023-05-29 (×2): 10 mg via ORAL
  Filled 2023-05-28 (×2): qty 1

## 2023-05-28 MED ORDER — POTASSIUM CHLORIDE CRYS ER 20 MEQ PO TBCR
40.0000 meq | EXTENDED_RELEASE_TABLET | Freq: Two times a day (BID) | ORAL | Status: AC
  Administered 2023-05-28 (×2): 40 meq via ORAL
  Filled 2023-05-28 (×2): qty 2

## 2023-05-28 MED ORDER — FLUTICASONE PROPIONATE 50 MCG/ACT NA SUSP
2.0000 | Freq: Every day | NASAL | Status: DC
  Administered 2023-05-28: 2 via NASAL
  Filled 2023-05-28: qty 16

## 2023-05-28 MED ORDER — ASPIRIN 81 MG PO TABS: 81.0000 mg | ORAL_TABLET | Freq: Every day | ORAL | Status: DC

## 2023-05-28 MED ORDER — CHLORHEXIDINE GLUCONATE CLOTH 2 % EX PADS
6.0000 | MEDICATED_PAD | Freq: Every day | CUTANEOUS | Status: DC
  Administered 2023-05-28: 6 via TOPICAL

## 2023-05-28 NOTE — Plan of Care (Signed)
  Problem: Education: Goal: Knowledge of General Education information will improve Description: Including pain rating scale, medication(s)/side effects and non-pharmacologic comfort measures Outcome: Progressing   Problem: Health Behavior/Discharge Planning: Goal: Ability to manage health-related needs will improve Outcome: Progressing   Problem: Clinical Measurements: Goal: Ability to maintain clinical measurements within normal limits will improve Outcome: Progressing   Problem: Clinical Measurements: Goal: Will remain free from infection Outcome: Progressing   Problem: Clinical Measurements: Goal: Diagnostic test results will improve Outcome: Progressing   Problem: Clinical Measurements: Goal: Respiratory complications will improve Outcome: Progressing   Problem: Clinical Measurements: Goal: Cardiovascular complication will be avoided Outcome: Progressing   Problem: Activity: Goal: Risk for activity intolerance will decrease Outcome: Progressing   Problem: Education: Goal: Knowledge of cardiac device and self-care will improve Outcome: Progressing   Problem: Education: Goal: Ability to safely manage health related needs after discharge will improve Outcome: Progressing   Problem: Cardiac: Goal: Ability to achieve and maintain adequate cardiopulmonary perfusion will improve Outcome: Progressing

## 2023-05-28 NOTE — Progress Notes (Signed)
Patient ID: Sheila Kim, female   DOB: 01/28/1941, 82 y.o.   MRN: [id number] [facility]    SUBJECTIVE: Feels much better today weakness is improved would like to get out of bed and sit in the chair.  Patient denies any fever chills or sweats no pain   Vitals:   05/28/23 0500 05/28/23 0600 05/28/23 0700 05/28/23 0800  BP: 136/74 (!) 155/81 (!) 158/78 (!) 160/82  Pulse: 74 65 70 73  Resp: (!) 22 17 (!) 21 (!) 21  Temp:    98.6 F (37 C)  TempSrc:    Oral  SpO2: 94% 95% 95% 95%  Weight:      Height:         Intake/Output Summary (Last 24 hours) at 05/28/2023 1223 Last data filed at 05/28/2023 0800 Gross per 24 hour  Intake 726 ml  Output 1150 ml  Net -424 ml      PHYSICAL EXAM  General: Well developed, well nourished, in no acute distress HEENT:  Normocephalic and atramatic Neck:  No JVD.  Lungs: Clear bilaterally to auscultation and percussion. Heart: HRRR . Normal S1 and S2 without gallops or murmurs.  Abdomen: Bowel sounds are positive, abdomen soft and non-tender  Msk:  Back normal, normal gait. Normal strength and tone for age. Extremities: No clubbing, cyanosis or edema.   Neuro: Alert and oriented X 3. Psych:  Good affect, responds appropriately   LABS: Basic Metabolic Panel: Recent Labs    05/26/23 0440 05/27/23 0608 05/28/23 0517  NA 138 137 134*  K 4.2 3.9 3.0*  CL 105 106 102  CO2 25 22 24   GLUCOSE 134* 110* 105*  BUN 26* 14 16  CREATININE 1.49* 0.91 0.91  CALCIUM 9.1 8.8* 8.6*  MG 2.4 2.1  --   PHOS 4.5 2.9 3.2   Liver Function Tests: Recent Labs    05/26/23 0440 05/27/23 0608 05/28/23 0517  AST 66*  --   --   ALT 50*  --   --   ALKPHOS 42  --   --   BILITOT 0.9  --   --   PROT 6.2*  --   --   ALBUMIN 3.4* 3.2* 3.1*   No results for input(s): "LIPASE", "AMYLASE" in the last 72 hours. CBC: Recent Labs    05/27/23 0608 05/28/23 0517  WBC 11.4* 11.0*  HGB 13.2 13.2  HCT 39.3 37.3  MCV 97.8 93.7  PLT 235 216    Cardiac Enzymes: No results for input(s): "CKTOTAL", "CKMB", "CKMBINDEX", "TROPONINI" in the last 72 hours. BNP: Invalid input(s): "POCBNP" D-Dimer: No results for input(s): "DDIMER" in the last 72 hours. Hemoglobin A1C: No results for input(s): "HGBA1C" in the last 72 hours. Fasting Lipid Panel: No results for input(s): "CHOL", "HDL", "LDLCALC", "TRIG", "CHOLHDL", "LDLDIRECT" in the last 72 hours. Thyroid Function Tests: Recent Labs    05/26/23 0933  TSH 1.100  T4TOTAL 5.6   Anemia Panel: No results for input(s): "VITAMINB12", "FOLATE", "FERRITIN", "TIBC", "IRON", "RETICCTPCT" in the last 72 hours.  DG Chest Port 1 View  Result Date: 05/27/2023 CLINICAL DATA:  Shortness of breath.  History of hypertension. EXAM: PORTABLE CHEST 1 VIEW COMPARISON:  05/26/2023 FINDINGS: Stable cardiomediastinal contours. Previous median sternotomy and CABG procedure. Loop recorder noted in the projection of the left heart. Unchanged asymmetric elevation of the left hemidiaphragm with persistent left lower lobe atelectasis. Unchanged blunting of the left costophrenic angle. Mild increase interstitial markings noted compared with previous exam. No superimposed airspace disease.  IMPRESSION: 1. Mild increase interstitial markings compared with previous exam. This may reflect mild interstitial edema. 2. Unchanged asymmetric elevation of the left hemidiaphragm with persistent atelectasis of the left lower lobe. Electronically Signed   By: Signa Kell M.D.   On: 05/27/2023 11:41   EP PPM/ICD IMPLANT  Result Date: 05/27/2023 Successful Micra AV2 leadless pacemaker implantation     Echo pending  TELEMETRY: Paced rhythm bundle branch block nonspecific T2 changes:  ASSESSMENT AND PLAN:  Principal Problem:   Complete heart block (HCC) Active Problems:   AKI (acute kidney injury) (HCC)   Hypotension due to drugs Status post permanent pacemaker placement Micra Patient had elevated troponins probably  demand ischemia Congestive heart failure Chronic renal insufficiency stage III Diabetes Atrial fibrillation  Plan Continue supportive management and care With activity out of bed to chair ambulate in the halls Remain physical and Occupational Therapy to help with with upcoming discharge Continue Eliquis therapy for anticoagulation Continue blood pressure management and control Patient is stable transferred to progressive care History of coronary bypass surgery times 01/16/2013 denies any anginal type symptoms Continue conservative diabetes management and control Anticipate discharge within the next 24 to 48 hours if patient is cleared by physical and Occupational Therapy   Alwyn Pea, MD 05/28/2023 12:23 PM

## 2023-05-28 NOTE — Progress Notes (Signed)
PROGRESS NOTE  Sheila Kim    DOB: [birth year], 82 y.o.  GEX:[id number]    Code Status: Full Code   DOA: 05/25/2023   LOS: 2   Brief hospital course  Sheila Kim is a 82 y.o. 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  They presented from home to the ED on 05/25/2023 with weakness x 1 days.   In the ED, it was found that they had WBC 11.4, hgb 13, platelets 260. Unremarkable metabolic panel exception mildly elevated Cr of 1.85 from baseline 1.2.  Significant findings included HR in the 20s and complete heart block on ECG. She required oxygen supplementation on presentation.   They were initially treated with transvenous pacemaker and home BB held. Cardiology was consulted  Patient was admitted to medicine service for further workup and management of heart block as outlined in detail below.  05/28/23 -permanent pacemaker placed and stable. Hypertensive during procedure so was given PRN antihypertensives and then had episode of hypotension after procedure.  Assessment & Plan  Principal Problem:   Complete heart block (HCC) Active Problems:   AKI (acute kidney injury) (HCC)   Hypotension due to drugs  Complete Heart Block s/p permanent pacer placed 8/16 PMHx: HTN, CAD s/p CABG x 4 (2014), HLD, prior MI - continuous cardiac monitoring - hold propanolol & lasix at this time - consider changing back to propranolol for essential tremor and patient preference - Cardiology following, appreciate input  - metoprolol - eliquis - echo not completed yet - PT/OT   Acute Kidney Injury- resolved Baseline Cr: 1.20, Cr on admission:1.85 - Daily BMP, replace electrolytes PRN - Avoid nephrotoxic agents as able, ensure adequate renal perfusion   Leukocytosis Suspect reactive - daily CBC, monitor WBC/fever curve   Hyperglycemia Recent Hemoglobin A1C: 6.0 - monitor glucose on daily labs  Body mass index is  27.28 kg/m.  VTE ppx: apixaban (ELIQUIS) tablet 2.5 mg Start: 05/28/23 1000 SCDs Start: 05/26/23 0057 Place TED hose Start: 05/26/23 0057 apixaban (ELIQUIS) tablet 2.5 mg   Diet:     Diet   Diet Heart Room service appropriate? Yes; Fluid consistency: Thin   Consultants: Cardiology  PCCM  Subjective 05/28/23    Pt reports feeling improved right now. She has some "wheezing" and post-nasal drip. Declines treatment other than her home allergy regimen. Denies SOB, chest pain.   Objective   Vitals:   05/28/23 0500 05/28/23 0600 05/28/23 0700 05/28/23 0800  BP: 136/74 (!) 155/81 (!) 158/78 (!) 160/82  Pulse: 74 65 70 73  Resp: (!) 22 17 (!) 21 (!) 21  Temp:      TempSrc:      SpO2: 94% 95% 95% 95%  Weight:      Height:        Intake/Output Summary (Last 24 hours) at 05/28/2023 0821 Last data filed at 05/28/2023 0700 Gross per 24 hour  Intake 733.5 ml  Output 1150 ml  Net -416.5 ml   Filed Weights   05/26/23 0111 05/27/23 0500 05/28/23 0408  Weight: 53.8 kg 56.8 kg 55.2 kg     Physical Exam:  General: awake, alert, NAD HEENT: atraumatic, clear conjunctiva, anicteric sclera, MMM, hearing grossly normal. Mild clear nasal drainage bilaterally Respiratory: normal respiratory effort. CTAB Cardiovascular: quick capillary refill, normal S1/S2, RRR, no JVD, murmurs Gastrointestinal: soft, NT, ND Nervous: A&O x3. no gross focal neurologic deficits, normal speech Extremities: moves all  equally, no edema, normal tone Skin: dry, intact, normal temperature, normal color. No rashes, lesions or ulcers on exposed skin Psychiatry: normal mood, congruent affect  Labs   I have personally reviewed the following labs and imaging studies CBC    Component Value Date/Time   WBC 11.0 (H) 05/28/2023 0517   RBC 3.98 05/28/2023 0517   HGB 13.2 05/28/2023 0517   HGB 14.6 10/16/2020 1608   HCT 37.3 05/28/2023 0517   HCT 42.1 10/16/2020 1608   PLT 216 05/28/2023 0517   PLT 318 10/16/2020  1608   MCV 93.7 05/28/2023 0517   MCV 97 10/16/2020 1608   MCV 96 05/13/2013 0452   MCH 33.2 05/28/2023 0517   MCHC 35.4 05/28/2023 0517   RDW 12.9 05/28/2023 0517   RDW 12.1 10/16/2020 1608   RDW 13.6 05/13/2013 0452   LYMPHSABS 2.4 10/16/2020 1608   LYMPHSABS 2.3 05/13/2013 0452   MONOABS 1.0 (H) 05/13/2013 0452   EOSABS 0.3 10/16/2020 1608   EOSABS 0.3 05/13/2013 0452   BASOSABS 0.1 10/16/2020 1608   BASOSABS 0.1 05/13/2013 0452      Latest Ref Rng & Units 05/28/2023    5:17 AM 05/27/2023    6:08 AM 05/26/2023    4:40 AM  BMP  Glucose 70 - 99 mg/dL 865  784  696   BUN 8 - 23 mg/dL 16  14  26    Creatinine 0.44 - 1.00 mg/dL 2.95  2.84  1.32   Sodium 135 - 145 mmol/L 134  137  138   Potassium 3.5 - 5.1 mmol/L 3.0  3.9  4.2   Chloride 98 - 111 mmol/L 102  106  105   CO2 22 - 32 mmol/L 24  22  25    Calcium 8.9 - 10.3 mg/dL 8.6  8.8  9.1     DG Chest Port 1 View  Result Date: 05/27/2023 CLINICAL DATA:  Shortness of breath.  History of hypertension. EXAM: PORTABLE CHEST 1 VIEW COMPARISON:  05/26/2023 FINDINGS: Stable cardiomediastinal contours. Previous median sternotomy and CABG procedure. Loop recorder noted in the projection of the left heart. Unchanged asymmetric elevation of the left hemidiaphragm with persistent left lower lobe atelectasis. Unchanged blunting of the left costophrenic angle. Mild increase interstitial markings noted compared with previous exam. No superimposed airspace disease. IMPRESSION: 1. Mild increase interstitial markings compared with previous exam. This may reflect mild interstitial edema. 2. Unchanged asymmetric elevation of the left hemidiaphragm with persistent atelectasis of the left lower lobe. Electronically Signed   By: Signa Kell M.D.   On: 05/27/2023 11:41   EP PPM/ICD IMPLANT  Result Date: 05/27/2023 Successful Micra AV2 leadless pacemaker implantation   CT CHEST WO CONTRAST  Result Date: 05/26/2023 CLINICAL DATA:  Evaluate for pleural  effusion. Recent cardiac catheterization. Abnormal chest radiograph. EXAM: CT CHEST WITHOUT CONTRAST TECHNIQUE: Multidetector CT imaging of the chest was performed following the standard protocol without IV contrast. RADIATION DOSE REDUCTION: This exam was performed according to the departmental dose-optimization program which includes automated exposure control, adjustment of the mA and/or kV according to patient size and/or use of iterative reconstruction technique. COMPARISON:  Chest radiograph from 05/11/2013 FINDINGS: Cardiovascular: Previous median sternotomy and CABG procedure. Aortic atherosclerosis. No pericardial effusion. Heart size is upper limits of normal. Mediastinum/Nodes: Thyroid gland, trachea, and esophagus are unremarkable. No mediastinal adenopathy. No axillary or supraclavicular adenopathy. The hilar lymph nodes are suboptimally visualized secondary to lack of IV contrast. Lungs/Pleura: Mild pleuroparenchymal scarring is noted overlying the posterior  right lower lobe. There is been interval atelectasis of the basilar left lower lobe with associated left lung volume loss and marked asymmetric elevation of the left hemidiaphragm. No obvious obstructing mass identified within the left lower lobe airway. Scarring versus subsegmental atelectasis noted in the anterior basal left upper lobe. Irregular nodule within the periphery of the left upper lobe measures 4 mm, image 51/4. Upper Abdomen: No acute abnormality identified within the imaged portions of the upper abdomen. There is left renal cortical scarring and volume loss. Prominent left extrarenal pelvis is noted. Aortic atherosclerotic calcifications. Too small to characterize low-density structure within the lateral segment of left lobe measures 7 mm, image 146/2. Musculoskeletal: No acute or suspicious findings. Mild degenerative disc disease noted within the thoracic spine. IMPRESSION: 1. Interval atelectasis of the basilar left lower lobe  with associated left lung volume loss and marked asymmetric elevation of the left hemidiaphragm. No obvious obstructing mass identified within the left lower lobe airway. Recommend follow-up imaging to ensure re-expansion of the left lower lobe and to exclude un underlying obstructing lesion. 2. Irregular nodule within the periphery of the left upper lobe measures 4 mm. If the patient is at high risk for bronchogenic carcinoma, follow-up chest CT at 1year is recommended. If the patient is at low risk, no follow-up is needed. This recommendation follows the consensus statement: Guidelines for Management of Small Pulmonary Nodules Detected on CT Scans: A Statement from the Fleischner Society as published in Radiology 2005; 237:395-400. 3.  Aortic Atherosclerosis (ICD10-I70.0). Electronically Signed   By: Signa Kell M.D.   On: 05/26/2023 13:52    Disposition Plan & Communication  Patient status: Inpatient  Admitted From: Home Planned disposition location: Home Anticipated discharge date: 8/18 pending clinical stability  Family Communication: none at bedside    Author: Leeroy Bock, DO [facility] 05/28/2023, 8:21 AM   Available by Epic secure chat 7AM-7PM. If 7PM-7AM, please contact night-coverage.  [facility] contact information found on [email or web address].

## 2023-05-29 ENCOUNTER — Inpatient Hospital Stay: Admit: 2023-05-29 | Payer: Medicare Other

## 2023-05-29 DIAGNOSIS — I442 Atrioventricular block, complete: Principal | ICD-10-CM

## 2023-05-29 DIAGNOSIS — Z950 Presence of cardiac pacemaker: Secondary | ICD-10-CM | POA: Diagnosis not present

## 2023-05-29 LAB — CBC
HCT: 38.8 % (ref 36.0–46.0)
Hemoglobin: 13.4 g/dL (ref 12.0–15.0)
MCH: 33.3 pg (ref 26.0–34.0)
MCHC: 34.5 g/dL (ref 30.0–36.0)
MCV: 96.5 fL (ref 80.0–100.0)
Platelets: 220 10*3/uL (ref 150–400)
RBC: 4.02 MIL/uL (ref 3.87–5.11)
RDW: 12.8 % (ref 11.5–15.5)
WBC: 9.4 10*3/uL (ref 4.0–10.5)
nRBC: 0 % (ref 0.0–0.2)

## 2023-05-29 LAB — GLUCOSE, CAPILLARY
Glucose-Capillary: 100 mg/dL — ABNORMAL HIGH (ref 70–99)
Glucose-Capillary: 107 mg/dL — ABNORMAL HIGH (ref 70–99)
Glucose-Capillary: 105 mg/dL — ABNORMAL HIGH (ref 70–99)
Glucose-Capillary: 94 mg/dL (ref 70–99)

## 2023-05-29 LAB — RENAL FUNCTION PANEL
Albumin: 3.1 g/dL — ABNORMAL LOW (ref 3.5–5.0)
Anion gap: 9 (ref 5–15)
BUN: 19 mg/dL (ref 8–23)
CO2: 21 mmol/L — ABNORMAL LOW (ref 22–32)
Calcium: 9 mg/dL (ref 8.9–10.3)
Chloride: 106 mmol/L (ref 98–111)
Creatinine, Ser: 0.76 mg/dL (ref 0.44–1.00)
GFR, Estimated: >60 mL/min (ref >60)
Glucose, Bld: 102 mg/dL — ABNORMAL HIGH (ref 70–99)
Phosphorus: 3 mg/dL (ref 2.5–4.6)
Potassium: 4.6 mmol/L (ref 3.5–5.1)
Sodium: 136 mmol/L (ref 135–145)

## 2023-05-29 MED ORDER — APIXABAN 2.5 MG PO TABS: 2.5000 mg | ORAL_TABLET | Freq: Two times a day (BID) | ORAL | 0 refills | Status: AC

## 2023-05-29 NOTE — Progress Notes (Incomplete)
Patient ID: Sheila Kim, female   DOB: Mar 04, 1941, 82 y.o.   MRN: [id number] [facility]    SUBJECTIVE: ***   Vitals:   05/29/23 0600 05/29/23 0709 05/29/23 0800 05/29/23 0825  BP: (!) 152/76 120/76    Pulse: 72 63 67 71  Resp: 20 14 (!) 26   Temp:   98.2 F (36.8 C)   TempSrc:   Oral   SpO2: 97% 92% 99%   Weight:      Height:         Intake/Output Summary (Last 24 hours) at 05/29/2023 1104 Last data filed at 05/29/2023 4098 Gross per 24 hour  Intake 330 ml  Output --  Net 330 ml      PHYSICAL EXAM  General: Well developed, well nourished, in no acute distress HEENT:  Normocephalic and atramatic Neck:  No JVD.  Lungs: Clear bilaterally to auscultation and percussion. Heart: HRRR . Normal S1 and S2 without gallops or murmurs.  Abdomen: Bowel sounds are positive, abdomen soft and non-tender  Msk:  Back normal, normal gait. Normal strength and tone for age. Extremities: No clubbing, cyanosis or edema.   Neuro: Alert and oriented X 3. Psych:  Good affect, responds appropriately   LABS: Basic Metabolic Panel: Recent Labs    05/27/23 0608 05/28/23 0517 05/29/23 0559  NA 137 134* 136  K 3.9 3.0* 4.6  CL 106 102 106  CO2 22 24 21*  GLUCOSE 110* 105* 102*  BUN 14 16 19   CREATININE 0.91 0.91 0.76  CALCIUM 8.8* 8.6* 9.0  MG 2.1  --   --   PHOS 2.9 3.2 3.0   Liver Function Tests: Recent Labs    05/28/23 0517 05/29/23 0559  ALBUMIN 3.1* 3.1*   No results for input(s): "LIPASE", "AMYLASE" in the last 72 hours. CBC: Recent Labs    05/28/23 0517 05/29/23 0559  WBC 11.0* 9.4  HGB 13.2 13.4  HCT 37.3 38.8  MCV 93.7 96.5  PLT 216 220   Cardiac Enzymes: No results for input(s): "CKTOTAL", "CKMB", "CKMBINDEX", "TROPONINI" in the last 72 hours. BNP: Invalid input(s): "POCBNP" D-Dimer: No results for input(s): "DDIMER" in the last 72 hours. Hemoglobin A1C: No results for input(s): "HGBA1C" in the last 72 hours. Fasting Lipid Panel: No  results for input(s): "CHOL", "HDL", "LDLCALC", "TRIG", "CHOLHDL", "LDLDIRECT" in the last 72 hours. Thyroid Function Tests: No results for input(s): "TSH", "T4TOTAL", "T3FREE", "THYROIDAB" in the last 72 hours.  Invalid input(s): "FREET3" Anemia Panel: No results for input(s): "VITAMINB12", "FOLATE", "FERRITIN", "TIBC", "IRON", "RETICCTPCT" in the last 72 hours.  No results found.   Echo ***  TELEMETRY: ***:  ASSESSMENT AND PLAN:  Principal Problem:   Complete heart block (HCC) Active Problems:   AKI (acute kidney injury) (HCC)   Hypotension due to drugs   S/P placement of cardiac pacemaker    1. ***   Alwyn Pea, MD, PHD [facility] 05/29/2023 11:04 AM

## 2023-05-29 NOTE — Plan of Care (Signed)
  Problem: Education: Goal: Knowledge of General Education information will improve Description: Including pain rating scale, medication(s)/side effects and non-pharmacologic comfort measures Outcome: Progressing   Problem: Clinical Measurements: Goal: Ability to maintain clinical measurements within normal limits will improve Outcome: Progressing Goal: Will remain free from infection Outcome: Progressing Goal: Respiratory complications will improve Outcome: Progressing   Problem: Activity: Goal: Risk for activity intolerance will decrease Outcome: Progressing   Problem: Nutrition: Goal: Adequate nutrition will be maintained Outcome: Progressing   Problem: Coping: Goal: Level of anxiety will decrease Outcome: Progressing   Problem: Clinical Measurements: Goal: Ability to maintain clinical measurements within normal limits will improve Outcome: Progressing

## 2023-05-29 NOTE — Evaluation (Addendum)
Physical Therapy Evaluation Patient Details Name: Sheila Kim MRN: [id number] DOB: 1941/08/13 Today's Date: 05/29/2023  History of Present Illness  Pt is a 82 y/o female admitted due to complete heart block and now s/p transvenous pacemaker placement (8/16). PMH includes CAD s/p CABG, CKD, HTN, HLD, anemia, diverticulitis, OA, and osteoporosis.  Clinical Impression   Pt presents sitting in recliner, no complaints of pain. She currently lives alone in a 1 story home with a ramp entrance and a rail on the L side. PTA she was independent with all ADLs and uses a cane only in morning when waking up, otherwise does not use AD during the day for ambulation. She walks the treadmill ~6 days/week and participates in regular physical activity    Pt strength screening revealed gross LE MMT 4-5/5. Pt performed 4 stage balance, unable to maintain SLS for >/=10s noting deficits in static standing balance. Pt able to perform sit<>stand CGA-supervision with/without RW. Pt tolerated ambulating ~138ft with no AD/RW, PT noting some unsteadiness and occasional cross over step with change in direction and during dual tasking without RW. Pt notes the above activity was overall moderate intensity and was fatigued at the end of session. Agreeable to utilize RW for the time being to decrease risk of falls.  HR monitored throughout session and responded appropriately to activity. Pt would benefit from continued skilled therapy to address deficits in activity tolerance and static/dynamic balance.      If plan is discharge home, recommend the following: Assistance with cooking/housework;Help with stairs or ramp for entrance;Assist for transportation   Can travel by private vehicle        Equipment Recommendations None recommended by PT  Recommendations for Other Services       Functional Status Assessment Patient has had a recent decline in their functional status and demonstrates the ability to make  significant improvements in function in a reasonable and predictable amount of time.     Precautions / Restrictions Precautions Precautions: Fall Restrictions Weight Bearing Restrictions: No      Mobility  Bed Mobility               General bed mobility comments: pt seated in recliner upon entry    Transfers Overall transfer level: Needs assistance Equipment used: Rolling walker (2 wheels) Transfers: Sit to/from Stand Sit to Stand: Supervision, Contact guard assist                Ambulation/Gait Ambulation/Gait assistance: Contact guard assist Gait Distance (Feet): 160 Feet Assistive device: None   Gait velocity: decreased     General Gait Details: Occasional crossover pattern with change in directions and dual tasking, some intermittent unsteadiness noted  Stairs            Wheelchair Mobility     Tilt Bed    Modified Rankin (Stroke Patients Only)       Balance Overall balance assessment: Needs assistance Sitting-balance support: No upper extremity supported, Feet unsupported, Feet supported Sitting balance-Leahy Scale: Normal       Standing balance-Leahy Scale: Good                 High Level Balance Comments: 4 stage balance: unable to maintain SLS for >/=10s             Pertinent Vitals/Pain Pain Assessment Pain Assessment: No/denies pain    Home Living Family/patient expects to be discharged to:: Private residence Living Arrangements: Alone Available Help at Discharge: Neighbor Type of Home: House  Home Access: Ramped entrance (Rail on L)       Home Layout: One level Home Equipment: Cane - single Librarian, academic (2 wheels)      Prior Function Prior Level of Function : Independent/Modified Independent             Mobility Comments: Pt uses cane only in morning when waking up, otherwise does not use AD during the day. She walks the treatmill ~6 days/week and participates in regular physical  activity. ADLs Comments: independent with all ADLs     Extremity/Trunk Assessment   Upper Extremity Assessment Upper Extremity Assessment: Overall WFL for tasks assessed    Lower Extremity Assessment Lower Extremity Assessment: Overall WFL for tasks assessed (BLE MMT gross 4-5/5)       Communication   Communication Communication: No apparent difficulties  Cognition Arousal: Alert Behavior During Therapy: WFL for tasks assessed/performed Overall Cognitive Status: Within Functional Limits for tasks assessed                                 General Comments: A&Ox4        General Comments General comments (skin integrity, edema, etc.): HR responded appropriately to activity    Exercises     Assessment/Plan    PT Assessment Patient needs continued PT services  PT Problem List Decreased activity tolerance;Decreased balance;Decreased mobility;Decreased knowledge of use of DME       PT Treatment Interventions DME instruction;Gait training;Functional mobility training;Therapeutic activities;Therapeutic exercise;Balance training;Neuromuscular re-education;Patient/family education;Stair training    PT Goals (Current goals can be found in the Care Plan section)  Acute Rehab PT Goals Patient Stated Goal: return home PT Goal Formulation: With patient Time For Goal Achievement: 06/12/23 Potential to Achieve Goals: Good    Frequency Min 1X/week     Co-evaluation               AM-PAC PT "6 Clicks" Mobility  Outcome Measure Help needed turning from your back to your side while in a flat bed without using bedrails?: None Help needed moving from lying on your back to sitting on the side of a flat bed without using bedrails?: None Help needed moving to and from a bed to a chair (including a wheelchair)?: A Little Help needed standing up from a chair using your arms (e.g., wheelchair or bedside chair)?: A Little Help needed to walk in hospital room?: A  Little Help needed climbing 3-5 steps with a railing? : A Little 6 Click Score: 20    End of Session Equipment Utilized During Treatment: Gait belt Activity Tolerance: Patient tolerated treatment well Patient left: in chair;with call bell/phone within reach Nurse Communication: Mobility status;Other (comment) (No DME needs) PT Visit Diagnosis: Other abnormalities of gait and mobility (R26.89);Unsteadiness on feet (R26.81)    Time: 1610-9604 PT Time Calculation (min) (ACUTE ONLY): 25 min   Charges:   PT Evaluation $PT Eval Low Complexity: 1 Low PT Treatments $Therapeutic Activity: 8-22 mins PT General Charges $$ ACUTE PT VISIT: 1 Visit        Jmichael Gille, PT, SPT 11:24 AM,05/29/23

## 2023-05-29 NOTE — Discharge Summary (Signed)
Physician Discharge Summary  Patient: Sheila Kim ZOX:[id number] DOB: 1940-11-22   Code Status: Full Code Admit date: 05/25/2023 Discharge date: 05/29/2023 Disposition: Home, outpatient PT, OT PCP: Erasmo Downer, MD  Recommendations for Outpatient Follow-up:  Follow up with PCP within 1-2 weeks Regarding general hospital follow up and preventative care Recommend    Discharge Diagnoses:  Principal Problem:   Complete heart block (HCC) Active Problems:   AKI (acute kidney injury) (HCC)   Hypotension due to drugs   S/P placement of cardiac pacemaker   Heart block AV complete [facility])  Brief Hospital Course Summary: Pt ***  Discharge Condition: {DISCHARGE CONDITION:19696}, improved Recommended discharge diet: {Discharge WJXB:[id number]}  Consultations: ***  Procedures/Studies: ***   Allergies as of 05/29/2023       Reactions   Alcohol    Gastric pain   Gelatin Capsule Empty  [gelatin]    Stomach pains   Gemfibrozil    Stomach pains, urine retention and muscle weakness.   Meloxicam    Drastically alters taste   Nsaids Other (See Comments)   Tolerates aspirin stomach pains and reflux   Oxycodone Itching   Trembling   Simvastatin Other (See Comments)   Feet and leg pain and general body weakness.   Sulfa Antibiotics Nausea And Vomiting, Other (See Comments)   Clarithromycin Rash, Nausea And Vomiting   Terrible taste in mouth and stomach ache.   Lopressor  [metoprolol] Palpitations   Niacin Other (See Comments), Rash   Flushing   Ofloxacin Rash, Nausea Only        Medication List     STOP taking these medications    aspirin 81 MG tablet   [employer] Senior Probiotic Caps   furosemide 40 MG tablet Commonly known as: LASIX   potassium chloride SA 20 MEQ tablet Commonly known as: KLOR-CON M       TAKE these medications    acetaminophen 500 MG tablet Commonly known as: TYLENOL Take 500 mg by mouth. In the PM and as needed    acetaminophen 650 MG CR tablet Commonly known as: TYLENOL Take 650 mg by mouth. 2 tablets in the AM and 1 tablet in the PM   apixaban 2.5 MG Tabs tablet Commonly known as: ELIQUIS Take 1 tablet (2.5 mg total) by mouth 2 (two) times daily.   ASPERCREME LIDOCAINE EX Apply 1 Application topically as needed. Roll on. Use every day   Calcium Magnesium Zinc 333-133-5 MG Tabs Take 1 tablet by mouth daily.   cholecalciferol 25 MCG (1000 UNIT) tablet Commonly known as: VITAMIN D3 Take 1,000 Units by mouth daily.   CoQ10 100 MG Caps Take 200 mg by mouth daily.   [facility]S SPECTRAVITE ADULT 50+ PO Spectravite Ultra Women's Health Senior - Historical Medication  daily Active   cyanocobalamin 1000 MCG tablet Take 1,000 mcg by mouth daily.   famotidine 20 MG tablet Commonly known as: PEPCID Take 20 mg by mouth 2 (two) times daily.   ferrous sulfate 325 (65 FE) MG tablet Take 325 mg by mouth daily with breakfast.   fluticasone 50 MCG/ACT nasal spray Commonly known as: FLONASE Place 2 sprays into both nostrils daily.   folic acid 1 MG tablet Commonly known as: FOLVITE Take 1 mg by mouth daily.   ketotifen 0.025 % ophthalmic solution Commonly known as: ZADITOR Place 1 drop into both eyes as needed.   loratadine 10 MG tablet Commonly known as: CLARITIN Take 10 mg by mouth daily.   MELATONIN EXTRA  STRENGTH PO Take 0.25 tablets by mouth at bedtime as needed.   Polyethyl Glycol-Propyl Glycol 0.4-0.3 % Soln Apply to eye as needed.   polyethylene glycol 17 g packet Commonly known as: MIRALAX / GLYCOLAX Take 17 g by mouth daily.   propranolol 40 MG tablet Commonly known as: INDERAL Take 0.5 tablets (20 mg total) by mouth 2 (two) times daily.   Psyllium Husk Powd Take by mouth daily.   pyridoxine 100 MG tablet Commonly known as: B-6 Take 100 mg by mouth daily.   rosuvastatin 5 MG tablet Commonly known as: Crestor Take 1 pill PO daily 5 times weekly.        Follow-up  Information     Paraschos, Alexander, MD. Go in 1 week(s).   Specialty: Cardiology Contact information: [address] [facility] [place] [address] [phone]                 Subjective   Pt reports ***  All questions and concerns were addressed at time of discharge.  Objective  Blood pressure 129/71, pulse 71, temperature 98.2 F (36.8 C), temperature source Oral, resp. rate 19, height 4\' 8"  (1.422 m), weight 55.3 kg, SpO2 97%.   General: Pt is alert, awake, not in acute distress Cardiovascular: RRR, S1/S2 +, no rubs, no gallops Respiratory: CTA bilaterally, no wheezing, no rhonchi Abdominal: Soft, NT, ND, bowel sounds + Extremities: no edema, no cyanosis  The results of significant diagnostics from this hospitalization (including imaging, microbiology, ancillary and laboratory) are listed below for reference.   Imaging studies: DG Chest Port 1 View  Result Date: 05/27/2023 CLINICAL DATA:  Shortness of breath.  History of hypertension. EXAM: PORTABLE CHEST 1 VIEW COMPARISON:  05/26/2023 FINDINGS: Stable cardiomediastinal contours. Previous median sternotomy and CABG procedure. Loop recorder noted in the projection of the left heart. Unchanged asymmetric elevation of the left hemidiaphragm with persistent left lower lobe atelectasis. Unchanged blunting of the left costophrenic angle. Mild increase interstitial markings noted compared with previous exam. No superimposed airspace disease. IMPRESSION: 1. Mild increase interstitial markings compared with previous exam. This may reflect mild interstitial edema. 2. Unchanged asymmetric elevation of the left hemidiaphragm with persistent atelectasis of the left lower lobe. Electronically Signed   By: Signa Kell M.D.   On: 05/27/2023 11:41   EP PPM/ICD IMPLANT  Result Date: 05/27/2023 Successful Micra AV2 leadless pacemaker implantation   CARDIAC CATHETERIZATION  Result Date: 05/26/2023    Anticipated discharge date to be determined.   No indication for antiplatelet therapy at this time . Conclusion Successful placement of temporary pacemaker to right ventricle for high-grade AV block Patient had excellent capture excellent position pacer sutured in place patient transferred to ICU   CT CHEST WO CONTRAST  Result Date: 05/26/2023 CLINICAL DATA:  Evaluate for pleural effusion. Recent cardiac catheterization. Abnormal chest radiograph. EXAM: CT CHEST WITHOUT CONTRAST TECHNIQUE: Multidetector CT imaging of the chest was performed following the standard protocol without IV contrast. RADIATION DOSE REDUCTION: This exam was performed according to the departmental dose-optimization program which includes automated exposure control, adjustment of the mA and/or kV according to patient size and/or use of iterative reconstruction technique. COMPARISON:  Chest radiograph from 05/11/2013 FINDINGS: Cardiovascular: Previous median sternotomy and CABG procedure. Aortic atherosclerosis. No pericardial effusion. Heart size is upper limits of normal. Mediastinum/Nodes: Thyroid gland, trachea, and esophagus are unremarkable. No mediastinal adenopathy. No axillary or supraclavicular adenopathy. The hilar lymph nodes are suboptimally visualized secondary to lack of IV contrast. Lungs/Pleura:  Mild pleuroparenchymal scarring is noted overlying the posterior right lower lobe. There is been interval atelectasis of the basilar left lower lobe with associated left lung volume loss and marked asymmetric elevation of the left hemidiaphragm. No obvious obstructing mass identified within the left lower lobe airway. Scarring versus subsegmental atelectasis noted in the anterior basal left upper lobe. Irregular nodule within the periphery of the left upper lobe measures 4 mm, image 51/4. Upper Abdomen: No acute abnormality identified within the imaged portions of the upper abdomen. There is left renal cortical scarring and volume  loss. Prominent left extrarenal pelvis is noted. Aortic atherosclerotic calcifications. Too small to characterize low-density structure within the lateral segment of left lobe measures 7 mm, image 146/2. Musculoskeletal: No acute or suspicious findings. Mild degenerative disc disease noted within the thoracic spine. IMPRESSION: 1. Interval atelectasis of the basilar left lower lobe with associated left lung volume loss and marked asymmetric elevation of the left hemidiaphragm. No obvious obstructing mass identified within the left lower lobe airway. Recommend follow-up imaging to ensure re-expansion of the left lower lobe and to exclude un underlying obstructing lesion. 2. Irregular nodule within the periphery of the left upper lobe measures 4 mm. If the patient is at high risk for bronchogenic carcinoma, follow-up chest CT at 1year is recommended. If the patient is at low risk, no follow-up is needed. This recommendation follows the consensus statement: Guidelines for Management of Small Pulmonary Nodules Detected on CT Scans: A Statement from the Fleischner Society as published in Radiology 2005; 237:395-400. 3.  Aortic Atherosclerosis (ICD10-I70.0). Electronically Signed   By: Signa Kell M.D.   On: 05/26/2023 13:52   DG Chest Port 1 View  Result Date: 05/26/2023 CLINICAL DATA:  Heart block,  recent cardiac catheterization EXAM: PORTABLE CHEST 1 VIEW COMPARISON:  05/11/2013 FINDINGS: External pad overlies enlarged cardiac silhouette. Moderate LEFT effusion noted. Elevation of the LEFT hemidiaphragm additionally. RIGHT lung clear. Midline sternotomy no pneumothorax IMPRESSION: Elevation of the LEFT hemidiaphragm with associated atelectasis and effusion. Electronically Signed   By: Genevive Bi M.D.   On: 05/26/2023 09:50    Labs: Basic Metabolic Panel: Recent Labs  Lab 05/25/23 2239 05/26/23 0440 05/27/23 0608 05/28/23 0517 05/29/23 0559  NA 134* 138 137 134* 136  K 4.9 4.2 3.9 3.0* 4.6  CL  98 105 106 102 106  CO2 20* 25 22 24  21*  GLUCOSE 238* 134* 110* 105* 102*  BUN 30* 26* 14 16 19   CREATININE 1.85* 1.49* 0.91 0.91 0.76  CALCIUM 9.7 9.1 8.8* 8.6* 9.0  MG 2.6* 2.4 2.1  --   --   PHOS  --  4.5 2.9 3.2 3.0   CBC: Recent Labs  Lab 05/25/23 2239 05/26/23 0440 05/27/23 0608 05/28/23 0517 05/29/23 0559  WBC 11.4* 11.4* 11.4* 11.0* 9.4  HGB 15.1* 13.0 13.2 13.2 13.4  HCT 46.1* 38.1 39.3 37.3 38.8  MCV 98.9 96.9 97.8 93.7 96.5  PLT 320 260 235 216 220   Microbiology: ***  Time coordinating discharge: Over 30 minutes  Leeroy Bock, MD  [facility] 05/29/2023, 1:13 PM

## 2023-05-30 ENCOUNTER — Telehealth: Payer: Self-pay | Admitting: *Deleted

## 2023-05-30 NOTE — Transitions of Care (Post Inpatient/ED Visit) (Signed)
   05/30/2023  Name: Sheila Kim MRN: [id number] DOB: Jun 11, 1941  Today's TOC FU Call Status: Today's TOC FU Call Status:: Unsuccessful Call (1st Attempt) Unsuccessful Call (1st Attempt) Date: 05/30/23  Attempted to reach the patient regarding the most recent Inpatient/ED visit.  Follow Up Plan: Additional outreach attempts will be made to reach the patient to complete the Transitions of Care (Post Inpatient/ED visit) call.  Gean Maidens BSN RN [facility] [phone]

## 2023-05-31 ENCOUNTER — Telehealth: Payer: Self-pay | Admitting: *Deleted

## 2023-05-31 NOTE — Transitions of Care (Post Inpatient/ED Visit) (Signed)
   05/31/2023  Name: BRANDAN SOCKWELL MRN: [id number] DOB: Oct 08, 1941  Today's TOC FU Call Status: Today's TOC FU Call Status:: Unsuccessful Call (2nd Attempt) Unsuccessful Call (2nd Attempt) Date: 05/31/23  Attempted to reach the patient regarding the most recent Inpatient/ED visit.  Follow Up Plan: Additional outreach attempts will be made to reach the patient to complete the Transitions of Care (Post Inpatient/ED visit) call.  Gean Maidens BSN RN [facility] [phone]

## 2023-06-01 DIAGNOSIS — Z950 Presence of cardiac pacemaker: Secondary | ICD-10-CM | POA: Diagnosis not present

## 2023-06-02 ENCOUNTER — Telehealth: Payer: Self-pay | Admitting: *Deleted

## 2023-06-02 NOTE — Transitions of Care (Post Inpatient/ED Visit) (Signed)
   06/02/2023  Name: Sheila Kim MRN: [id number] DOB: 06-Dec-1940  Today's TOC FU Call Status: Today's TOC FU Call Status:: Unsuccessful Call (3rd Attempt) Unsuccessful Call (3rd Attempt) Date: 06/02/23  Attempted to reach the patient regarding the most recent Inpatient/ED visit.  Follow Up Plan: Additional outreach attempts will be made to reach the patient to complete the Transitions of Care (Post Inpatient/ED visit) call.   Gean Maidens BSN RN [facility] [phone]

## 2023-06-14 ENCOUNTER — Telehealth: Payer: Self-pay | Admitting: Family Medicine

## 2023-06-14 ENCOUNTER — Ambulatory Visit: Payer: Self-pay

## 2023-06-14 NOTE — Telephone Encounter (Signed)
Reason for Disposition  [1] Caller requesting NON-URGENT health information AND [2] PCP's office is the best resource  Answer Assessment - Initial Assessment Questions 1. REASON FOR CALL or QUESTION: "What is your reason for calling today?" or "How can I best help you?" or "What question do you have that I can help answer?"     I had a complete heart block on 05/25/2023.   I had a temporary pacer put in that night.   Then on 8/16 I had a Micro AZ 2 transcatheter pacemaker put into my right ventricle.    I'm doing well now.   I'm beginning to feel much better now.  They put me on Eliquis 2.5 mg twice a day.   I'm off the aspirin.    I called into the [facility] to get my rx for the Eliquis.   I want them to have the rx on hand.   [facility] is sending it to your office for prior authorization and all.     I don't need it until the end of Oct.  After Oct. 30th.    I got a 3 month rx from the hospital when I was discharged.     It's less expensive via mail order.    I don't want to get too many at one time.   So please don't reorder the Eliquis until after Oct. 30th, 2024.  Protocols used: Information Only Call - No Triage-A-AH  Chief Complaint: Relaying an update on her medical condition Symptoms: Had complete heart block Frequency: 05/25/2023.   See notes for updates on condition and medications. Pertinent Negatives: Patient denies any problems. Disposition: [] ED /[] Urgent Care (no appt availability in office) / [] Appointment(In office/virtual)/ []  [place] [facility]/ [] Home Care/ [] Refused Recommended Disposition /[] [place] [facility]/ [x]  Follow-up with PCP Additional Notes: Message sent to Dr. Beryle Flock for her update.

## 2023-06-14 NOTE — Telephone Encounter (Signed)
Summary: med info   Pt called in just to let nurse know that she had a total heart blockage on 08/14 and pacemaker ,micro az 2 transcatheter put it in right ventrial of her heart and she isnt taking aspirin anymore,but she is taking eloquis     Left message to call back.

## 2023-06-14 NOTE — Telephone Encounter (Signed)
Patient advised.

## 2023-06-14 NOTE — Telephone Encounter (Signed)
She other phone note. She requests that we don't send this refill yet.

## 2023-06-14 NOTE — Telephone Encounter (Signed)
[facility] faxed refill request for the following medications:    apixaban (ELIQUIS) 2.5 MG TABS tablet     Please advise

## 2023-06-14 NOTE — Telephone Encounter (Signed)
Thanks for the update. Would recommend having cardiology manage refills going forward since they are managing it. I will not send a refill currently.

## 2023-06-21 DIAGNOSIS — I213 ST elevation (STEMI) myocardial infarction of unspecified site: Secondary | ICD-10-CM | POA: Diagnosis not present

## 2023-06-21 DIAGNOSIS — I2510 Atherosclerotic heart disease of native coronary artery without angina pectoris: Secondary | ICD-10-CM | POA: Diagnosis not present

## 2023-06-21 DIAGNOSIS — I519 Heart disease, unspecified: Secondary | ICD-10-CM | POA: Diagnosis not present

## 2023-07-04 DIAGNOSIS — R8290 Unspecified abnormal findings in urine: Secondary | ICD-10-CM | POA: Diagnosis not present

## 2023-07-04 DIAGNOSIS — I10 Essential (primary) hypertension: Secondary | ICD-10-CM | POA: Diagnosis not present

## 2023-07-04 DIAGNOSIS — E1122 Type 2 diabetes mellitus with diabetic chronic kidney disease: Secondary | ICD-10-CM | POA: Diagnosis not present

## 2023-07-04 DIAGNOSIS — I129 Hypertensive chronic kidney disease with stage 1 through stage 4 chronic kidney disease, or unspecified chronic kidney disease: Secondary | ICD-10-CM | POA: Diagnosis not present

## 2023-07-11 DIAGNOSIS — R600 Localized edema: Secondary | ICD-10-CM | POA: Diagnosis not present

## 2023-07-11 DIAGNOSIS — I129 Hypertensive chronic kidney disease with stage 1 through stage 4 chronic kidney disease, or unspecified chronic kidney disease: Secondary | ICD-10-CM | POA: Diagnosis not present

## 2023-07-11 DIAGNOSIS — N2581 Secondary hyperparathyroidism of renal origin: Secondary | ICD-10-CM | POA: Diagnosis not present

## 2023-07-11 DIAGNOSIS — I10 Essential (primary) hypertension: Secondary | ICD-10-CM | POA: Diagnosis not present

## 2023-07-18 ENCOUNTER — Other Ambulatory Visit: Payer: Self-pay

## 2023-07-18 MED ORDER — COMIRNATY 30 MCG/0.3ML IM SUSY
0.3000 mL | PREFILLED_SYRINGE | Freq: Once | INTRAMUSCULAR | 0 refills | Status: AC
  Filled 2023-07-18: qty 0.3, 1d supply, fill #0

## 2023-07-18 MED ORDER — FLUAD 0.5 ML IM SUSY
0.5000 mL | PREFILLED_SYRINGE | Freq: Once | INTRAMUSCULAR | 0 refills | Status: AC
  Filled 2023-07-18: qty 0.5, 1d supply, fill #0

## 2023-09-07 DIAGNOSIS — N182 Chronic kidney disease, stage 2 (mild): Secondary | ICD-10-CM | POA: Diagnosis not present

## 2023-09-07 DIAGNOSIS — I2510 Atherosclerotic heart disease of native coronary artery without angina pectoris: Secondary | ICD-10-CM | POA: Diagnosis not present

## 2023-09-07 DIAGNOSIS — I21A1 Myocardial infarction type 2: Secondary | ICD-10-CM | POA: Diagnosis not present

## 2023-09-07 DIAGNOSIS — Z951 Presence of aortocoronary bypass graft: Secondary | ICD-10-CM | POA: Diagnosis not present

## 2023-09-07 DIAGNOSIS — I213 ST elevation (STEMI) myocardial infarction of unspecified site: Secondary | ICD-10-CM | POA: Diagnosis not present

## 2023-09-07 DIAGNOSIS — I10 Essential (primary) hypertension: Secondary | ICD-10-CM | POA: Diagnosis not present

## 2023-09-07 DIAGNOSIS — E785 Hyperlipidemia, unspecified: Secondary | ICD-10-CM | POA: Diagnosis not present

## 2023-11-01 DIAGNOSIS — E119 Type 2 diabetes mellitus without complications: Secondary | ICD-10-CM | POA: Diagnosis not present

## 2023-11-01 DIAGNOSIS — Z961 Presence of intraocular lens: Secondary | ICD-10-CM | POA: Diagnosis not present

## 2023-11-04 ENCOUNTER — Other Ambulatory Visit: Payer: Self-pay | Admitting: Family Medicine

## 2023-11-04 DIAGNOSIS — K219 Gastro-esophageal reflux disease without esophagitis: Secondary | ICD-10-CM

## 2023-11-04 NOTE — Telephone Encounter (Signed)
D/C 05/28/23. Requested Prescriptions  Refused Prescriptions Disp Refills   pantoprazole (PROTONIX) 40 MG tablet [Pharmacy Med Name: Pantoprazole Sodium 40 MG Oral Tablet Delayed Release] 200 tablet 2    Sig: TAKE 1 TABLET BY MOUTH TWICE  DAILY     Gastroenterology: Proton Pump Inhibitors Passed - 11/04/2023  1:25 PM      Passed - Valid encounter within last 12 months    Recent Outpatient Visits           5 months ago Primary hypertension   [place] [facility] [place], Marzella Schlein, MD   11 months ago Encounter for annual wellness visit (AWV) in Medicare patient   [place] [facility] [place], Marzella Schlein, MD   1 year ago Primary hypertension   [place] [facility] Jacky Kindle, FNP   1 year ago Encounter for annual wellness visit (AWV) in Medicare patient   [facility] [place], Marzella Schlein, MD   2 years ago Primary hypertension   [place] [facility] [place], Marzella Schlein, MD       Future Appointments             In 1 week [place], Marzella Schlein, MD [facility]   In 6 months Deirdre Evener, MD [facility]

## 2023-11-14 ENCOUNTER — Other Ambulatory Visit: Payer: Self-pay | Admitting: Family Medicine

## 2023-11-15 NOTE — Telephone Encounter (Signed)
 D/C 05/29/23. Requested Prescriptions  Refused Prescriptions Disp Refills   furosemide  (LASIX ) 40 MG tablet [Pharmacy Med Name: Furosemide  40 MG Oral Tablet] 100 tablet 2    Sig: TAKE ONE-HALF TABLET BY MOUTH  TWICE DAILY     Cardiovascular:  Diuretics - Loop Failed - 11/15/2023 12:45 PM      Failed - Valid encounter within last 6 months    Recent Outpatient Visits           6 months ago Primary hypertension   Sheila Kim, Sheila HERO, Sheila Kim   1 year ago Encounter for annual wellness visit (AWV) in Medicare patient   [facility] [name], Sheila HERO, Sheila Kim   1 year ago Primary hypertension   [name], FNP   2 years ago Encounter for annual wellness visit (AWV) in Medicare patient   [place] [facility] [place], Sheila HERO, Sheila Kim   2 years ago Primary hypertension   [place] [facility] [place], Sheila HERO, Sheila Kim       Future Appointments             In 2 days Bacigalupo, Sheila HERO, Sheila Kim [facility]   In 6 months Hester Alm BROCKS, Sheila Kim [facility]            Passed - K in normal range and within 180 days    Potassium  Date Value Ref Range Status  05/29/2023 4.6 3.5 - 5.1 mmol/L Final  01/09/2014 4.3 3.5 - 5.1 mmol/L Final         Passed - Ca in normal range and within 180 days    Calcium   Date Value Ref Range Status  05/29/2023 9.0 8.9 - 10.3 mg/dL Final   Calcium , Total  Date Value Ref Range Status  05/13/2013 8.4 (L) 8.5 - 10.1 mg/dL Final         Passed - Na in normal range and within 180 days    Sodium  Date Value Ref Range Status  05/29/2023 136 135 - 145 mmol/L Final  05/16/2023 138 134 - 144 mmol/L Final  05/13/2013 142 136 - 145 mmol/L Final         Passed - Cr in normal range and within 180 days    Creat  Date Value Ref Range Status  09/28/2017 1.12 (H) 0.60 - 0.93 mg/dL  Final    Comment:    For patients >70 years of age, the reference limit for Creatinine is approximately 13% higher for people identified as African-American. .    Creatinine, Ser  Date Value Ref Range Status  05/29/2023 0.76 0.44 - 1.00 mg/dL Final         Passed - Cl in normal range and within 180 days    Chloride  Date Value Ref Range Status  05/29/2023 106 98 - 111 mmol/L Final  05/13/2013 109 (H) 98 - 107 mmol/L Final         Passed - Mg Level in normal range and within 180 days    Magnesium  Date Value Ref Range Status  05/27/2023 2.1 1.7 - 2.4 mg/dL Final    Comment:    Performed at [facility], [address] [place], [place] [address]         Passed - Last BP in normal range    BP Readings from Last 1 Encounters:  05/29/23 129/71

## 2023-11-17 ENCOUNTER — Encounter: Payer: Self-pay | Admitting: Family Medicine

## 2023-11-17 ENCOUNTER — Ambulatory Visit: Payer: Medicare Other | Admitting: Family Medicine

## 2023-11-17 VITALS — BP 128/76 | HR 60 | Ht <= 58 in | Wt 115.4 lb

## 2023-11-17 DIAGNOSIS — Z0001 Encounter for general adult medical examination with abnormal findings: Secondary | ICD-10-CM

## 2023-11-17 DIAGNOSIS — E7800 Pure hypercholesterolemia, unspecified: Secondary | ICD-10-CM

## 2023-11-17 DIAGNOSIS — I442 Atrioventricular block, complete: Secondary | ICD-10-CM | POA: Diagnosis not present

## 2023-11-17 DIAGNOSIS — I130 Hypertensive heart and chronic kidney disease with heart failure and stage 1 through stage 4 chronic kidney disease, or unspecified chronic kidney disease: Secondary | ICD-10-CM | POA: Diagnosis not present

## 2023-11-17 DIAGNOSIS — Z0000 Encounter for general adult medical examination without abnormal findings: Secondary | ICD-10-CM

## 2023-11-17 DIAGNOSIS — L920 Granuloma annulare: Secondary | ICD-10-CM | POA: Diagnosis not present

## 2023-11-17 DIAGNOSIS — H6123 Impacted cerumen, bilateral: Secondary | ICD-10-CM | POA: Diagnosis not present

## 2023-11-17 DIAGNOSIS — I10 Essential (primary) hypertension: Secondary | ICD-10-CM | POA: Diagnosis not present

## 2023-11-17 DIAGNOSIS — N1832 Chronic kidney disease, stage 3b: Secondary | ICD-10-CM | POA: Diagnosis not present

## 2023-11-17 DIAGNOSIS — R7303 Prediabetes: Secondary | ICD-10-CM | POA: Diagnosis not present

## 2023-11-17 DIAGNOSIS — Z79899 Other long term (current) drug therapy: Secondary | ICD-10-CM | POA: Diagnosis not present

## 2023-11-17 NOTE — Assessment & Plan Note (Signed)
 Recommend low carb diet Recheck A1c

## 2023-11-17 NOTE — Progress Notes (Signed)
 Annual Wellness Visit     Patient: Sheila Kim, Female    DOB: 05/10/1941, 83 y.o.   MRN: [id number] Visit Date: 11/17/2023  Today's Provider: Jon Eva, MD   Chief Complaint  Patient presents with   Annual Exam    Last completed 11/15/22 Diet - Vegetarian with fish Exercise - at least 6 days a week walking and bed exercises 7 days a week and 3 lb hand weights 3xs a week avg 50-60 mins daily and maybe 30 minutes on Sundays Feeling - well Sleeping - fairly well, some nights just toss and turn but then makes up for it the next night Concerns - Patient would like right ear to be check and for a sore to be checked on left leg that has been present since Christmas. Originally thought was spider bite   Rash    Left leg sore. Reports redder, less soreness, no difference in size. Patient has tried several things from otc and has been present since around Christmas   Ear Pain    Right ear pain especially when weather changes so she would just like ti checked. Hears a crackling noise at night. Tries to keep clean with sweet oil and using a hand cloth to clean it.    Subjective    Sheila Kim is a 83 y.o. female who presents today for her Annual Wellness Visit.  Discussed the use of AI scribe software for clinical note transcription with the patient, who gave verbal consent to proceed.  History of Present Illness   The patient, with a history of prediabetes, presents for a wellness check-up. She reports a skin lesion on her leg that has been present for several months. Initially thought to be a spider bite, the lesion has not completely resolved despite application of anti-itch cream. The patient describes the lesion as sore and itchy, and it initially presented with a bull's-eye appearance.  In addition, the patient reports occasional shooting pain in her right ear. She denies any hearing loss but is concerned about possible wax build-up. She has been  managing the discomfort with sweet oil and has noticed some wax discharge in the mornings. The patient has a history of having her ears flushed due to wax build-up many years ago.  The patient is currently on multiple medications including Eliquis , Crestor , Propranolol , and Protonix . She reports good adherence to her medication regimen.            Medications: Outpatient Medications Prior to Visit  Medication Sig   acetaminophen  (TYLENOL ) 500 MG tablet Take 500 mg by mouth. In the PM and as needed   acetaminophen  (TYLENOL ) 650 MG CR tablet Take 650 mg by mouth. 2 tablets in the AM and 1 tablet in the PM   ASPERCREME LIDOCAINE  EX Apply 1 Application topically as needed. Roll on. Use every day   Calcium  Magnesium Zinc 333-133-5 MG TABS Take 1 tablet by mouth daily.   cholecalciferol (VITAMIN D ) 1000 UNITS tablet Take 1,000 Units by mouth daily.    Coenzyme Q10 (COQ10) 100 MG CAPS Take 200 mg by mouth daily.    cyanocobalamin 1000 MCG tablet Take 1,000 mcg by mouth daily.    famotidine (PEPCID) 20 MG tablet Take 20 mg by mouth 2 (two) times daily.   ferrous sulfate 325 (65 FE) MG tablet Take 325 mg by mouth daily with breakfast.    fluticasone  (FLONASE ) 50 MCG/ACT nasal spray Place 2 sprays into both nostrils daily.  folic acid (FOLVITE) 1 MG tablet Take 1 mg by mouth daily.    furosemide  (LASIX ) 40 MG tablet Take 20 mg by mouth 2 (two) times daily.   ketotifen (ZADITOR) 0.025 % ophthalmic solution Place 1 drop into both eyes as needed.   loratadine  (CLARITIN ) 10 MG tablet Take 10 mg by mouth daily.    MELATONIN EXTRA STRENGTH PO Take 0.25 tablets by mouth at bedtime as needed.    Multiple Vitamins-Minerals ([employer] SPECTRAVITE ADULT 50+ PO) Spectravite Ultra Women's Health Senior - Historical Medication  daily Active   pantoprazole  (PROTONIX ) 40 MG tablet Take 40 mg by mouth daily.   Polyethyl Glycol-Propyl Glycol 0.4-0.3 % SOLN Apply to eye as needed.   polyethylene glycol (MIRALAX  /  GLYCOLAX ) packet Take 17 g by mouth daily.   propranolol  (INDERAL ) 40 MG tablet Take 0.5 tablets (20 mg total) by mouth 2 (two) times daily.   Psyllium Husk POWD Take by mouth daily.    pyridoxine (B-6) 100 MG tablet Take 100 mg by mouth daily.    rosuvastatin  (CRESTOR ) 5 MG tablet Take 1 pill PO daily 5 times weekly.   apixaban  (ELIQUIS ) 2.5 MG TABS tablet Take 1 tablet (2.5 mg total) by mouth 2 (two) times daily.   No facility-administered medications prior to visit.    Allergies  Allergen Reactions   Alcohol     Gastric pain   Ezetimibe Other (See Comments)    Leg pain   Gelatin Capsule Empty  [Gelatin]     Stomach pains   Gemfibrozil     Stomach pains, urine retention and muscle weakness.   Meloxicam     Drastically alters taste   Nsaids Other (See Comments)    Tolerates aspirin  stomach pains and reflux   Oxycodone Itching    Trembling   Simvastatin Other (See Comments)    Feet and leg pain and general body weakness.   Sulfa Antibiotics Nausea And Vomiting and Other (See Comments)   Clarithromycin Rash and Nausea And Vomiting    Terrible taste in mouth and stomach ache.   Lopressor   [Metoprolol ] Palpitations   Niacin Other (See Comments) and Rash    Flushing   Ofloxacin Rash and Nausea Only    Patient Care Team: Myrla Jon HERO, MD as PCP - General (Family Medicine) Dingeldein, Elspeth, MD as Consulting Physician (Ophthalmology) Ammon Blunt, MD as Consulting Physician (Cardiology) Twylla Glendia BROCKS, MD as Consulting Physician (Urology) Dodson Nest I, MD as Referring Physician (Physical Medicine and Rehabilitation)  Review of Systems       Objective    Vitals: BP 128/76 Comment: home reading @ 10:30 am this morning  Pulse 60   Ht 4' 8 (1.422 m)   Wt 115 lb 6.4 oz (52.3 kg)   SpO2 100%   BMI 25.87 kg/m      Physical Exam Vitals reviewed.  Constitutional:      General: She is not in acute distress.    Appearance: Normal appearance.  She is well-developed. She is not diaphoretic.  HENT:     Head: Normocephalic and atraumatic.     Right Ear: Tympanic membrane, ear canal and external ear normal.     Left Ear: Tympanic membrane, ear canal and external ear normal.     Nose: Nose normal.     Mouth/Throat:     Mouth: Mucous membranes are moist.     Pharynx: Oropharynx is clear. No oropharyngeal exudate.  Eyes:     General: No scleral icterus.  Conjunctiva/sclera: Conjunctivae normal.     Pupils: Pupils are equal, round, and reactive to light.  Neck:     Thyroid : No thyromegaly.  Cardiovascular:     Rate and Rhythm: Normal rate and regular rhythm.     Heart sounds: Normal heart sounds. No murmur heard. Pulmonary:     Effort: Pulmonary effort is normal. No respiratory distress.     Breath sounds: Normal breath sounds. No wheezing or rales.  Abdominal:     General: There is no distension.     Palpations: Abdomen is soft.     Tenderness: There is no abdominal tenderness.  Musculoskeletal:        General: No deformity.     Cervical back: Neck supple.     Right lower leg: No edema.     Left lower leg: No edema.  Lymphadenopathy:     Cervical: No cervical adenopathy.  Skin:    General: Skin is warm and dry.     Findings: No rash.  Neurological:     Mental Status: She is alert and oriented to person, place, and time. Mental status is at baseline.     Gait: Gait normal.  Psychiatric:        Mood and Affect: Mood normal.        Behavior: Behavior normal.        Thought Content: Thought content normal.     Most recent functional status assessment:    11/17/2023    1:28 PM  In your present state of health, do you have any difficulty performing the following activities:  Hearing? 0  Vision? 0  Difficulty concentrating or making decisions? 0  Walking or climbing stairs? 1  Comment climbing stairs  Dressing or bathing? 0  Doing errands, shopping? 0  Preparing Food and eating ? N  Using the Toilet? N  In the  past six months, have you accidently leaked urine? Y  Do you have problems with loss of bowel control? N  Managing your Medications? N  Managing your Finances? N  Housekeeping or managing your Housekeeping? N   Most recent fall risk assessment:    11/17/2023    1:30 PM  Fall Risk   Falls in the past year? 0  Number falls in past yr: 0  Injury with Fall? 0  Risk for fall due to : No Fall Risks  Follow up Falls evaluation completed    Most recent depression screenings:    11/17/2023    1:31 PM 11/15/2022    1:27 PM  PHQ 2/9 Scores  PHQ - 2 Score 0 0  PHQ- 9 Score  1   Most recent cognitive screening:    11/17/2023    1:31 PM  6CIT Screen  What Year? 0 points  What month? 0 points  What time? 0 points  Count back from 20 0 points  Months in reverse 0 points  Repeat phrase 2 points  Total Score 2 points   Most recent Audit-C alcohol use screening    11/15/2023   11:48 AM  Alcohol Use Disorder Test (AUDIT)  1. How often do you have a drink containing alcohol? 0   A score of 3 or more in women, and 4 or more in men indicates increased risk for alcohol abuse, EXCEPT if all of the points are from question 1   No results found.  No results found for any visits on 11/17/23.  Assessment & Plan     Annual wellness visit done  today including the all of the following: Reviewed patient's Family Medical History Reviewed and updated list of patient's medical providers Assessment of cognitive impairment was done Assessed patient's functional ability Established a written schedule for health screening services Health Risk Assessent Completed and Reviewed  Exercise Activities and Dietary recommendations  Goals      DIET - REDUCE SUGAR INTAKE     Recommend cutting back and monitoring of dessert consumed in daily diet.        Immunization History  Administered Date(s) Administered   Fluad  Quad(high Dose 65+) 07/24/2019, 07/24/2020, 07/15/2021, 07/23/2022   Fluad   Trivalent(High Dose 65+) 07/18/2023   Influenza, High Dose Seasonal PF 07/26/2015, 08/06/2016, 07/21/2017, 08/03/2018   PFIZER Comirnaty (Gray Top)Covid-19 Tri-Sucrose Vaccine 01/14/2021, 07/23/2022   PFIZER(Purple Top)SARS-COV-2 Vaccination 11/14/2019, 12/05/2019, 07/08/2020   Pfizer Covid-19 Vaccine Bivalent Booster 1yrs & up 06/22/2021, 02/23/2022   Pfizer(Comirnaty )Fall Seasonal Vaccine 12 years and older 12/24/2022, 07/18/2023   Pneumococcal Conjugate-13 07/16/2014   Pneumococcal Polysaccharide-23 03/16/1999, 08/21/2005   Rsv, Bivalent, Protein Subunit Rsvpref,pf Marlow) 08/06/2022   Tdap 07/01/2011, 11/20/2021   Zoster Recombinant(Shingrix ) 09/25/2018, 11/27/2018   Zoster, Live 10/01/2011    Health Maintenance  Topic Date Due   COVID-19 Vaccine (10 - 2024-25 season) 09/12/2023   DEXA SCAN  09/20/2028 (Originally 04/10/2011)   Medicare Annual Wellness (AWV)  11/16/2024   DTaP/Tdap/Td (3 - Td or Tdap) 11/21/2031   Pneumonia Vaccine 26+ Years old  Completed   INFLUENZA VACCINE  Completed   Zoster Vaccines- Shingrix   Completed   HPV VACCINES  Aged Out     Discussed health benefits of physical activity, and encouraged her to engage in regular exercise appropriate for her age and condition.    Problem List Items Addressed This Visit       Cardiovascular and Mediastinum   BP (high blood pressure)   Well controlled on home readings Continue current medications Reviewed metabolic panel F/u in 6 months       Relevant Medications   furosemide  (LASIX ) 40 MG tablet   Other Relevant Orders   Comprehensive metabolic panel   Heart & renal disease, hypertensive, 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HCC)   Risk factor control as above F/b Cardiology      Relevant Medications   furosemide  (LASIX ) 40 MG tablet   Other Relevant Orders   Comprehensive metabolic panel   Complete heart block (HCC)   F/b Cardiology Conitnue eliquis  Pacemaker in place      Relevant Medications   furosemide   (LASIX ) 40 MG tablet   Other Relevant Orders   CBC w/Diff/Platelet     Genitourinary   CKD (chronic kidney disease)   F/b nephrology Reviewed metabolic panel      Relevant Orders   Comprehensive metabolic panel     Other   Hypercholesteremia   Has previously failed multiple statins and zetia Mildly elevated triglycerides, but does not need fenofibrates Previously stable Continue statin - Crestor  5mg  5 times weekly      Relevant Medications   furosemide  (LASIX ) 40 MG tablet   Other Relevant Orders   Comprehensive metabolic panel   Lipid panel   Prediabetes   Recommend low carb diet Recheck A1c       Relevant Orders   Hemoglobin A1c   Other Visit Diagnoses       Encounter for annual wellness visit (AWV) in Medicare patient    -  Primary     Encounter for annual physical exam       Relevant Orders  Hemoglobin A1c   Comprehensive metabolic panel   Lipid panel   CBC w/Diff/Platelet     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 The lesion has improved but remains sore and unhealed. Differential diagnosis includes granuloma and non-melanoma skin cancer (e.g., squamous cell carcinoma). Given her prediabetes and concerns about healing, dermatology evaluation is recommended to rule out malignancy and consider biopsy. - Refer to dermatologist for evaluation and possible biopsy of the lesion  Cerumen Impaction Intermittent shooting pain in the right ear, suspected due to earwax buildup. Examination revealed cerumen in both ears, with the right ear being more symptomatic. Discussed the benefits of ear flushing to alleviate symptoms and prevent further complications. - Flush ears to  remove cerumen  General Health Maintenance Routine wellness visit. She is up to date on vaccinations including shingles, flu, COVID booster, and RSV. Blood pressure is well-controlled. Routine labs including blood sugar (A1c), kidney and liver function, and cholesterol are planned. Blood counts will be rechecked due to recent initiation of Eliquis . Mammogram is due in July. Bone density test is not indicated at this time. - Order routine labs including blood sugar (A1c), kidney and liver function, cholesterol, and blood counts - Schedule mammogram for July - Continue current medications: Crestor  5 mg five times a week, Eliquis , propranolol  20 mg twice a day, Protonix  40 mg daily, potassium tablets, Flonase , famotidine, probiotic, Tylenol , calcium , vitamin D3, CoQ10, B6, B12, folic acid  Follow-up - Schedule follow-up visit in six months - Schedule annual physical in one year.       Return in about 6 months (around 05/16/2024) for chronic disease f/u and 1 yr CPE/AWV.     Jon Eva, MD  [facility] [phone] (phone) [phone] (fax)  [facility]

## 2023-11-17 NOTE — Assessment & Plan Note (Signed)
Risk factor control as above F/b Cardiology

## 2023-11-17 NOTE — Assessment & Plan Note (Signed)
F/b nephrology Reviewed metabolic panel

## 2023-11-17 NOTE — Assessment & Plan Note (Signed)
 Has previously failed multiple statins and zetia Mildly elevated triglycerides, but does not need fenofibrates Previously stable Continue statin - Crestor  5mg  5 times weekly

## 2023-11-17 NOTE — Assessment & Plan Note (Signed)
 F/b Cardiology Conitnue eliquis  Pacemaker in place

## 2023-11-17 NOTE — Assessment & Plan Note (Signed)
Well controlled on home readings Continue current medications Reviewed metabolic panel F/u in 6 months

## 2023-11-18 ENCOUNTER — Encounter: Payer: Self-pay | Admitting: Family Medicine

## 2023-11-18 LAB — CBC WITH DIFFERENTIAL/PLATELET
Basophils Absolute: 0.1 10*3/uL (ref 0.0–0.2)
Basos: 1 %
EOS (ABSOLUTE): 0.4 10*3/uL (ref 0.0–0.4)
Eos: 5 %
Hematocrit: 44.9 % (ref 34.0–46.6)
Hemoglobin: 14.9 g/dL (ref 11.1–15.9)
Immature Grans (Abs): 0 10*3/uL (ref 0.0–0.1)
Immature Granulocytes: 0 %
Lymphocytes Absolute: 2.3 10*3/uL (ref 0.7–3.1)
Lymphs: 27 %
MCH: 31.6 pg (ref 26.6–33.0)
MCHC: 33.2 g/dL (ref 31.5–35.7)
MCV: 95 fL (ref 79–97)
Monocytes Absolute: 0.8 10*3/uL (ref 0.1–0.9)
Monocytes: 10 %
Neutrophils Absolute: 4.9 10*3/uL (ref 1.4–7.0)
Neutrophils: 57 %
Platelets: 341 10*3/uL (ref 150–450)
RBC: 4.72 x10E6/uL (ref 3.77–5.28)
RDW: 12.4 % (ref 11.7–15.4)
WBC: 8.5 10*3/uL (ref 3.4–10.8)

## 2023-11-18 LAB — COMPREHENSIVE METABOLIC PANEL
ALT: 18 [IU]/L (ref 0–32)
AST: 25 [IU]/L (ref 0–40)
Albumin: 4.7 g/dL (ref 3.7–4.7)
Alkaline Phosphatase: 61 [IU]/L (ref 44–121)
BUN/Creatinine Ratio: 17 (ref 12–28)
BUN: 20 mg/dL (ref 8–27)
Bilirubin Total: 1.1 mg/dL (ref 0.0–1.2)
CO2: 27 mmol/L (ref 20–29)
Calcium: 10.4 mg/dL — ABNORMAL HIGH (ref 8.7–10.3)
Chloride: 97 mmol/L (ref 96–106)
Creatinine, Ser: 1.21 mg/dL — ABNORMAL HIGH (ref 0.57–1.00)
Globulin, Total: 2.4 g/dL (ref 1.5–4.5)
Glucose: 114 mg/dL — ABNORMAL HIGH (ref 70–99)
Potassium: 5 mmol/L (ref 3.5–5.2)
Sodium: 140 mmol/L (ref 134–144)
Total Protein: 7.1 g/dL (ref 6.0–8.5)
eGFR: 44 mL/min/{1.73_m2} — ABNORMAL LOW (ref >59)

## 2023-11-18 LAB — HEMOGLOBIN A1C
Est. average glucose Bld gHb Est-mCnc: 134 mg/dL
Hgb A1c MFr Bld: 6.3 % — ABNORMAL HIGH (ref 4.8–5.6)

## 2023-11-18 LAB — LIPID PANEL
Chol/HDL Ratio: 3.9 {ratio} (ref 0.0–4.4)
Cholesterol, Total: 204 mg/dL — ABNORMAL HIGH (ref 100–199)
HDL: 52 mg/dL (ref >39)
LDL Chol Calc (NIH): 120 mg/dL — ABNORMAL HIGH (ref 0–99)
Triglycerides: 185 mg/dL — ABNORMAL HIGH (ref 0–149)
VLDL Cholesterol Cal: 32 mg/dL (ref 5–40)

## 2023-11-18 LAB — SPECIMEN STATUS REPORT

## 2023-12-20 ENCOUNTER — Other Ambulatory Visit: Payer: Self-pay

## 2023-12-24 ENCOUNTER — Other Ambulatory Visit: Payer: Self-pay | Admitting: Family Medicine

## 2023-12-24 DIAGNOSIS — I10 Essential (primary) hypertension: Secondary | ICD-10-CM

## 2023-12-26 NOTE — Telephone Encounter (Signed)
 Requested by interface surescripts. Medication discontinued 05/29/23.  Requested Prescriptions  Pending Prescriptions Disp Refills   pantoprazole (PROTONIX) 40 MG tablet [Pharmacy Med Name: Pantoprazole Sodium 40 MG Oral Tablet Delayed Release] 200 tablet 2    Sig: TAKE 1 TABLET BY MOUTH TWICE  DAILY     Gastroenterology: Proton Pump Inhibitors Passed - 12/26/2023  4:21 PM      Passed - Valid encounter within last 12 months    Recent Outpatient Visits           7 months ago Primary hypertension   [facility] [place], Marzella Schlein, MD   1 year ago Encounter for annual wellness visit (AWV) in Medicare patient   [facility] [place], Marzella Schlein, MD   1 year ago Primary hypertension   [place] [facility] Merita Norton T, FNP   2 years ago Encounter for annual wellness visit (AWV) in Medicare patient   [facility] [place], Marzella Schlein, MD   2 years ago Primary hypertension   [place] [facility] [place], Marzella Schlein, MD       Future Appointments             In 2 weeks Elie Goody, MD [facility] [place]   In 4 months Bacigalupo, Marzella Schlein, MD [facility]   In 5 months Deirdre Evener, MD [place] [facility]             furosemide (LASIX) 40 MG tablet [Pharmacy Med Name: Furosemide 40 MG Oral Tablet] 100 tablet 2    Sig: TAKE ONE-HALF TABLET BY MOUTH  TWICE DAILY     Cardiovascular:  Diuretics - Loop Failed - 12/26/2023  4:21 PM      Failed - Ca in normal range and within 180 days    Calcium  Date Value Ref Range Status  11/17/2023 10.4 (H) 8.7 - 10.3 mg/dL Final   Calcium, Total  Date Value Ref Range Status  05/13/2013 8.4 (L) 8.5 - 10.1 mg/dL Final         Failed - Cr in normal range and within 180 days    Creat  Date Value Ref Range Status  09/28/2017 1.12 (H) 0.60 - 0.93 mg/dL Final     Comment:    For patients >87 years of age, the reference limit for Creatinine is approximately 13% higher for people identified as African-American. .    Creatinine, Ser  Date Value Ref Range Status  11/17/2023 1.21 (H) 0.57 - 1.00 mg/dL Final         Failed - Mg Level in normal range and within 180 days    Magnesium  Date Value Ref Range Status  05/27/2023 2.1 1.7 - 2.4 mg/dL Final    Comment:    Performed at [facility], [address] [place], [place] [address]         Failed - Valid encounter within last 6 months    Recent Outpatient Visits           7 months ago Primary hypertension   [place] [place], Marzella Schlein, MD   1 year ago Encounter for annual wellness visit (AWV) in Medicare patient   [facility] [place], Marzella Schlein, MD   1 year ago Primary hypertension   [place] [facility] Merita Norton T, FNP   2 years ago Encounter  for annual wellness visit (AWV) in Medicare patient   [facility] [place], Marzella Schlein, MD   2 years ago Primary hypertension   [place] [facility] [place], Marzella Schlein, MD       Future Appointments             In 2 weeks Elie Goody, MD [facility]   In 4 months Bacigalupo, Marzella Schlein, MD [facility]   In 5 months Deirdre Evener, MD [facility]            Passed - K in normal range and within 180 days    Potassium  Date Value Ref Range Status  11/17/2023 5.0 3.5 - 5.2 mmol/L Final  01/09/2014 4.3 3.5 - 5.1 mmol/L Final         Passed - Na in normal range and within 180 days    Sodium  Date Value Ref Range Status  11/17/2023 140 134 - 144 mmol/L Final  05/13/2013 142 136 - 145 mmol/L Final         Passed - Cl in normal range and within 180 days    Chloride  Date Value Ref Range Status  11/17/2023 97 96 - 106  mmol/L Final  05/13/2013 109 (H) 98 - 107 mmol/L Final         Passed - Last BP in normal range    BP Readings from Last 1 Encounters:  11/17/23 128/76          rosuvastatin (CRESTOR) 5 MG tablet [Pharmacy Med Name: Rosuvastatin Calcium 5 MG Oral Tablet] 72 tablet     Sig: TAKE 1 TABLET BY MOUTH 5 TIMES  WEEKLY     Cardiovascular:  Antilipid - Statins 2 Failed - 12/26/2023  4:21 PM      Failed - Cr in normal range and within 360 days    Creat  Date Value Ref Range Status  09/28/2017 1.12 (H) 0.60 - 0.93 mg/dL Final    Comment:    For patients >75 years of age, the reference limit for Creatinine is approximately 13% higher for people identified as African-American. .    Creatinine, Ser  Date Value Ref Range Status  11/17/2023 1.21 (H) 0.57 - 1.00 mg/dL Final         Failed - Lipid Panel in normal range within the last 12 months    Cholesterol, Total  Date Value Ref Range Status  11/17/2023 204 (H) 100 - 199 mg/dL Final   LDL Cholesterol (Calc)  Date Value Ref Range Status  09/28/2017 125 (H) mg/dL (calc) Final    Comment:    Reference range: <100 . Desirable range <100 mg/dL for primary prevention;   <70 mg/dL for patients with CHD or diabetic patients  with > or = 2 CHD risk factors. Marland Kitchen LDL-C is now calculated using the Martin-Hopkins  calculation, which is a validated novel method providing  better accuracy than the Friedewald equation in the  estimation of LDL-C.  Horald Pollen et al. Lenox Ahr. 4098;119(14): 2061-2068  (http://education.[employer].com/faq/FAQ164)    LDL Chol Calc (NIH)  Date Value Ref Range Status  11/17/2023 120 (H) 0 - 99 mg/dL Final   HDL  Date Value Ref Range Status  11/17/2023 52 >39 mg/dL Final   Triglycerides  Date Value Ref Range Status  11/17/2023 185 (H) 0 - 149 mg/dL Final         Passed - Patient  is not pregnant      Passed - Valid encounter within last 12 months    Recent Outpatient Visits           7 months ago  Primary hypertension   [facility] [place], Marzella Schlein, MD   1 year ago Encounter for annual wellness visit (AWV) in Medicare patient   [facility] [place], Marzella Schlein, MD   1 year ago Primary hypertension   [place] Merita Norton T, FNP   2 years ago Encounter for annual wellness visit (AWV) in Medicare patient   [facility] [place], Marzella Schlein, MD   2 years ago Primary hypertension   [place] [facility] [place], Marzella Schlein, MD       Future Appointments             In 2 weeks Elie Goody, MD [facility]   In 4 months Bacigalupo, Marzella Schlein, MD [facility]   In 5 months Deirdre Evener, MD [place]             propranolol (INDERAL) 40 MG tablet [Pharmacy Med Name: PROPRANOLOL  40MG   TAB] 100 tablet 3    Sig: TAKE ONE-HALF TABLET BY MOUTH  TWICE DAILY     Cardiovascular:  Beta Blockers Failed - 12/26/2023  4:21 PM      Failed - Valid encounter within last 6 months    Recent Outpatient Visits           7 months ago Primary hypertension   [facility] [place], Marzella Schlein, MD   1 year ago Encounter for annual wellness visit (AWV) in Medicare patient   [facility], Marzella Schlein, MD   1 year ago Primary hypertension   [place] [facility] Merita Norton T, FNP   2 years ago Encounter for annual wellness visit (AWV) in Medicare patient   [facility] [place], Marzella Schlein, MD   2 years ago Primary hypertension   [place] [facility] [place], Marzella Schlein, MD       Future Appointments             In 2 weeks Elie Goody, MD [facility] [place] [facility]   In 4 months Bacigalupo, Marzella Schlein, MD [facility]    In 5 months Deirdre Evener, MD [place] [facility]            Passed - Last BP in normal range    BP Readings from Last 1 Encounters:  11/17/23 128/76         Passed - Last Heart Rate in normal range    Pulse Readings from Last 1 Encounters:  11/17/23 60         Refused Prescriptions Disp Refills   potassium chloride SA (KLOR-CON M) 20 MEQ tablet [Pharmacy Med Name: Potassium Chloride Crys ER 20 MEQ Oral Tablet Extended Release] 100 tablet 3    Sig: TAKE 1 TABLET BY MOUTH DAILY     Endocrinology:  Minerals - Potassium Supplementation Failed - 12/26/2023  4:21 PM      Failed - Cr in normal range and within 360 days    Creat  Date Value Ref Range Status  09/28/2017 1.12 (H) 0.60 - 0.93 mg/dL Final  Comment:    For patients >15 years of age, the reference limit for Creatinine is approximately 13% higher for people identified as African-American. .    Creatinine, Ser  Date Value Ref Range Status  11/17/2023 1.21 (H) 0.57 - 1.00 mg/dL Final         Passed - K in normal range and within 360 days    Potassium  Date Value Ref Range Status  11/17/2023 5.0 3.5 - 5.2 mmol/L Final  01/09/2014 4.3 3.5 - 5.1 mmol/L Final         Passed - Valid encounter within last 12 months    Recent Outpatient Visits           7 months ago Primary hypertension   [place] [facility] [place], Marzella Schlein, MD   1 year ago Encounter for annual wellness visit (AWV) in Medicare patient   [facility] [place], Marzella Schlein, MD   1 year ago Primary hypertension   [place] [facility] Merita Norton T, FNP   2 years ago Encounter for annual wellness visit (AWV) in Medicare patient   [facility] [place], Marzella Schlein, MD   2 years ago Primary hypertension   [place] [facility] [place], Marzella Schlein, MD       Future Appointments             In 2 weeks Elie Goody, MD [facility]   In 4 months Bacigalupo, Marzella Schlein, MD [facility]   In 5 months Deirdre Evener, MD [facility] [place] [facility]

## 2023-12-26 NOTE — Telephone Encounter (Signed)
 Requested medication (s) are due for refill today: historical medications and expired medications  Requested medication (s) are on the active medication list: yes  Last refill:  protonix, lasix- historical medications, crestor 11/15/22 #65 3 refills , propranolol- 11/15/22 #100 3 refills   Future visit scheduled: yes in 4 months   Notes to clinic:  do you want to order historical medications, protonix , lasix? Do you want to renew Rx crestor , propranolol?     Requested Prescriptions  Pending Prescriptions Disp Refills   pantoprazole (PROTONIX) 40 MG tablet [Pharmacy Med Name: Pantoprazole Sodium 40 MG Oral Tablet Delayed Release] 200 tablet 2    Sig: TAKE 1 TABLET BY MOUTH TWICE  DAILY     Gastroenterology: Proton Pump Inhibitors Passed - 12/26/2023  4:22 PM      Passed - Valid encounter within last 12 months    Recent Outpatient Visits           7 months ago Primary hypertension   [place] [place], Marzella Schlein, MD   1 year ago Encounter for annual wellness visit (AWV) in Medicare patient   [facility] [place], Marzella Schlein, MD   1 year ago Primary hypertension   [place] [facility] Mer[facility] Norton T, FNP   2 years ago Encounter for annual wellness visit (AWV) in Medicare patient   [facility] [place], Marzella Schlein, MD   2 years ago Primary hypertension   [facility] [place], Marzella Schlein, MD       Future Appointments             In 2 weeks Elie Goody, MD [facility] [place] [facility]   In 4 months Bacigalupo, Marzella Schlein, MD [facility]   In 5 months Deirdre Evener, MD [place] [facility]             furosemide (LASIX) 40 MG tablet [Pharmacy Med Name: Furosemide 40 MG Oral Tablet] 100 tablet 2    Sig: TAKE ONE-HALF TABLET BY MOUTH  TWICE DAILY     Cardiovascular:  Diuretics - Loop Failed -  12/26/2023  4:22 PM      Failed - Ca in normal range and within 180 days    Calcium  Date Value Ref Range Status  11/17/2023 10.4 (H) 8.7 - 10.3 mg/dL Final   Calcium, Total  Date Value Ref Range Status  05/13/2013 8.4 (L) 8.5 - 10.1 mg/dL Final         Failed - Cr in normal range and within 180 days    Creat  Date Value Ref Range Status  09/28/2017 1.12 (H) 0.60 - 0.93 mg/dL Final    Comment:    For patients >19 years of age, the reference limit for Creatinine is approximately 13% higher for people identified as African-American. .    Creatinine, Ser  Date Value Ref Range Status  11/17/2023 1.21 (H) 0.57 - 1.00 mg/dL Final         Failed - Mg Level in normal range and within 180 days    Magnesium  Date Value Ref Range Status  05/27/2023 2.1 1.7 - 2.4 mg/dL Final    Comment:    Performed at [facility], [address], [place], [place] [address]         Failed - Valid encounter within last 6 months    Recent Outpatient Visits  7 months ago Primary hypertension   [place] [facility] [place], Marzella Schlein, MD   1 year ago Encounter for annual wellness visit (AWV) in Medicare patient   [facility] [place], Marzella Schlein, MD   1 year ago Primary hypertension   [place] [facility] Merita Norton T, FNP   2 years ago Encounter for annual wellness visit (AWV) in Medicare patient   [place] [facility] [place], Marzella Schlein, MD   2 years ago Primary hypertension   [place] [facility] [place], Marzella Schlein, MD       Future Appointments             In 2 weeks Elie Goody, MD [facility] [place] [facility]   In 4 months Bacigalupo, Marzella Schlein, MD [facility], [facility]   In 5 months Deirdre Evener, MD [facility] [place] [facility]            Passed - K in normal range and within 180 days    Potassium  Date Value Ref  Range Status  11/17/2023 5.0 3.5 - 5.2 mmol/L Final  01/09/2014 4.3 3.5 - 5.1 mmol/L Final         Passed - Na in normal range and within 180 days    Sodium  Date Value Ref Range Status  11/17/2023 140 134 - 144 mmol/L Final  05/13/2013 142 136 - 145 mmol/L Final         Passed - Cl in normal range and within 180 days    Chloride  Date Value Ref Range Status  11/17/2023 97 96 - 106 mmol/L Final  05/13/2013 109 (H) 98 - 107 mmol/L Final         Passed - Last BP in normal range    BP Readings from Last 1 Encounters:  11/17/23 128/76          rosuvastatin (CRESTOR) 5 MG tablet [Pharmacy Med Name: Rosuvastatin Calcium 5 MG Oral Tablet] 72 tablet     Sig: TAKE 1 TABLET BY MOUTH 5 TIMES  WEEKLY     Cardiovascular:  Antilipid - Statins 2 Failed - 12/26/2023  4:22 PM      Failed - Cr in normal range and within 360 days    Creat  Date Value Ref Range Status  09/28/2017 1.12 (H) 0.60 - 0.93 mg/dL Final    Comment:    For patients >109 years of age, the reference limit for Creatinine is approximately 13% higher for people identified as African-American. .    Creatinine, Ser  Date Value Ref Range Status  11/17/2023 1.21 (H) 0.57 - 1.00 mg/dL Final         Failed - Lipid Panel in normal range within the last 12 months    Cholesterol, Total  Date Value Ref Range Status  11/17/2023 204 (H) 100 - 199 mg/dL Final   LDL Cholesterol (Calc)  Date Value Ref Range Status  09/28/2017 125 (H) mg/dL (calc) Final    Comment:    Reference range: <100 . Desirable range <100 mg/dL for primary prevention;   <70 mg/dL for patients with CHD or diabetic patients  with > or = 2 CHD risk factors. Marland Kitchen LDL-C is now calculated using the Martin-Hopkins  calculation, which is a validated novel method providing  better accuracy than the Friedewald equation in the  estimation of LDL-C.  Horald Pollen et al. Len[place] Ahr. 9562;130(86): 2061-2068  (http://education.[facility].com/faq/FAQ164)  LDL  Chol Calc (NIH)  Date Value Ref Range Status  11/17/2023 120 (H) 0 - 99 mg/dL Final   HDL  Date Value Ref Range Status  11/17/2023 52 >39 mg/dL Final   Triglycerides  Date Value Ref Range Status  11/17/2023 185 (H) 0 - 149 mg/dL Final         Passed - Patient is not pregnant      Passed - Valid encounter within last 12 months    Recent Outpatient Visits           7 months ago Primary hypertension   [address] [facility] [place], Marzella Schlein, MD   1 year ago Encounter for annual wellness visit (AWV) in Medicare patient   [facility] [place], Marzella Schlein, MD   1 year ago Primary hypertension   [place] [facility] Merita Norton T, FNP   2 years ago Encounter for annual wellness visit (AWV) in Medicare patient   [facility] [place], Marzella Schlein, MD   2 years ago Primary hypertension   [place] [facility] [place], Marzella Schlein, MD       Future Appointments             In 2 weeks Elie Goody, MD [facility] [place] [facility]   In 4 months Bacigalupo, Marzella Schlein, MD [facility]   In 5 months Deirdre Evener, MD [place] [facility]             propranolol (INDERAL) 40 MG tablet [Pharmacy Med Name: PROPRANOLOL  40MG   TAB] 100 tablet 3    Sig: TAKE ONE-HALF TABLET BY MOUTH  TWICE DAILY     Cardiovascular:  Beta Blockers Failed - 12/26/2023  4:22 PM      Failed - Valid encounter within last 6 months    Recent Outpatient Visits           7 months ago Primary hypertension   [facility] [place], Marzella Schlein, MD   1 year ago Encounter for annual wellness visit (AWV) in Medicare patient   [facility] [place], Marzella Schlein, MD   1 year ago Primary hypertension   [place] [facility] Merita Norton T, FNP   2 years ago Encounter for annual wellness  visit (AWV) in Medicare patient   [facility] [place], Marzella Schlein, MD   2 years ago Primary hypertension   [facility] [place], Marzella Schlein, MD       Future Appointments             In 2 weeks Elie Goody, MD [facility] [place] [facility]   In 4 months Bacigalupo, Marzella Schlein, MD [facility], [facility]   In 5 months Deirdre Evener, MD [place] [facility]            Passed - Last BP in normal range    BP Readings from Last 1 Encounters:  11/17/23 128/76         Passed - Last Heart Rate in normal range    Pulse Readings from Last 1 Encounters:  11/17/23 60         Refused Prescriptions Disp Refills   potassium chloride SA (KLOR-CON M) 20 MEQ tablet [Pharmacy Med Name: Potassium Chloride Crys ER 20 MEQ Oral Tablet Extended Release] 100 tablet 3  Sig: TAKE 1 TABLET BY MOUTH DAILY     Endocrinology:  Minerals - Potassium Supplementation Failed - 12/26/2023  4:22 PM      Failed - Cr in normal range and within 360 days    Creat  Date Value Ref Range Status  09/28/2017 1.12 (H) 0.60 - 0.93 mg/dL Final    Comment:    For patients >52 years of age, the reference limit for Creatinine is approximately 13% higher for people identified as African-American. .    Creatinine, Ser  Date Value Ref Range Status  11/17/2023 1.21 (H) 0.57 - 1.00 mg/dL Final         Passed - K in normal range and within 360 days    Potassium  Date Value Ref Range Status  11/17/2023 5.0 3.5 - 5.2 mmol/L Final  01/09/2014 4.3 3.5 - 5.1 mmol/L Final         Passed - Valid encounter within last 12 months    Recent Outpatient Visits           7 months ago Primary hypertension   [place] [facility] [place], Marzella Schlein, MD   1 year ago Encounter for annual wellness visit (AWV) in Medicare patient   [facility] [place], Mar[facility], MD   1 year ago  Primary hypertension   [place] [facility] Merita Norton T, FNP   2 years ago Encounter for annual wellness visit (AWV) in Medicare patient   [facility] [place], Marzella Schlein, MD   2 years ago Primary hypertension    [facility] [place], Marzella Schlein, MD       Future Appointments             In 2 weeks Elie Goody, MD [facility]   In 4 months Bacigalupo, Marzella Schlein, MD [facility], [facility]   In 5 months Deirdre Evener, MD [facility] [place] [facility]

## 2024-01-04 DIAGNOSIS — E785 Hyperlipidemia, unspecified: Secondary | ICD-10-CM | POA: Diagnosis not present

## 2024-01-04 DIAGNOSIS — I21A1 Myocardial infarction type 2: Secondary | ICD-10-CM | POA: Diagnosis not present

## 2024-01-04 DIAGNOSIS — I10 Essential (primary) hypertension: Secondary | ICD-10-CM | POA: Diagnosis not present

## 2024-01-04 DIAGNOSIS — I213 ST elevation (STEMI) myocardial infarction of unspecified site: Secondary | ICD-10-CM | POA: Diagnosis not present

## 2024-01-04 DIAGNOSIS — I2510 Atherosclerotic heart disease of native coronary artery without angina pectoris: Secondary | ICD-10-CM | POA: Diagnosis not present

## 2024-01-04 DIAGNOSIS — Z951 Presence of aortocoronary bypass graft: Secondary | ICD-10-CM | POA: Diagnosis not present

## 2024-01-04 DIAGNOSIS — I519 Heart disease, unspecified: Secondary | ICD-10-CM | POA: Diagnosis not present

## 2024-01-12 ENCOUNTER — Encounter: Payer: Self-pay | Admitting: Dermatology

## 2024-01-12 ENCOUNTER — Ambulatory Visit: Payer: Medicare Other | Admitting: Dermatology

## 2024-01-12 DIAGNOSIS — C44729 Squamous cell carcinoma of skin of left lower limb, including hip: Secondary | ICD-10-CM

## 2024-01-12 DIAGNOSIS — L720 Epidermal cyst: Secondary | ICD-10-CM

## 2024-01-12 DIAGNOSIS — D492 Neoplasm of unspecified behavior of bone, soft tissue, and skin: Secondary | ICD-10-CM

## 2024-01-12 NOTE — Patient Instructions (Addendum)
 Wound Care Instructions  Cleanse wound gently with soap and water once a day then pat dry with clean gauze. Apply a thin coat of Petrolatum (petroleum jelly, "Vaseline") over the wound (unless you have an allergy to this). We recommend that you use a new, sterile tube of Vaseline. Do not pick or remove scabs. Do not remove the yellow or white "healing tissue" from the base of the wound.  Cover the wound with fresh, clean, nonstick gauze and secure with paper tape. You may use Band-Aids in place of gauze and tape if the wound is small enough, but would recommend trimming much of the tape off as there is often too much. Sometimes Band-Aids can irritate the skin.  You should call the office for your biopsy report after 1 week if you have not already been contacted.  If you experience any problems, such as abnormal amounts of bleeding, swelling, significant bruising, significant pain, or evidence of infection, please call the office immediately.  FOR ADULT SURGERY PATIENTS: If you need something for pain relief you may take 1 extra strength Tylenol (acetaminophen) AND 2 Ibuprofen (200mg  each) together every 4 hours as needed for pain. (do not take these if you are allergic to them or if you have a reason you should not take them.) Typically, you may only need pain medication for 1 to 3 days.     Offices for Mohs surgery:  [facility] [address], [place], [place] [address]  [facility] [address], [place], [place] [address]    Recommend daily broad spectrum sunscreen SPF 30+ to sun-exposed areas, reapply every 2 hours as needed. Call for new or changing lesions.  Staying in the shade or wearing long sleeves, sun glasses (UVA+UVB protection) and wide brim hats (4-inch brim around the entire circumference of the hat) are also recommended for sun protection.      Due to recent changes in healthcare laws, you may see results of your pathology and/or  laboratory studies on MyChart before the doctors have had a chance to review them. We understand that in some cases there may be results that are confusing or concerning to you. Please understand that not all results are received at the same time and often the doctors may need to interpret multiple results in order to provide you with the best plan of care or course of treatment. Therefore, we ask that you please give [place] 2 business days to thoroughly review all your results before contacting the office for clarification. Should we see a critical lab result, you will be contacted sooner.   If You Need Anything After Your Visit  If you have any questions or concerns for your doctor, please call our main line at [phone] and press option 4 to reach your doctor's medical assistant. If no one answers, please leave a voicemail as directed and we will return your call as soon as possible. Messages left after 4 pm will be answered the following business day.   You may also send [place] a message via MyChart. We typically respond to MyChart messages within 1-2 business days.  For prescription refills, please ask your pharmacy to contact our office. Our fax number is [phone].  If you have an urgent issue when the clinic is closed that cannot wait until the next business day, you can page your doctor at the number below.    Please note that while we do our best to be available for urgent issues  outside of office hours, we are not available 24/7.   If you have an urgent issue and are unable to reach [place], you may choose to seek medical care at your doctor's office, retail clinic, urgent care center, or emergency room.  If you have a medical emergency, please immediately call 911 or go to the emergency department.  Pager Numbers  - Dr. Gwen Pounds: [phone]  - Dr. Roseanne Reno: [phone]  - Dr. Katrinka Blazing: [phone]   In the event of inclement weather, please call our main line at [phone] for an  update on the status of any delays or closures.  Dermatology Medication Tips: Please keep the boxes that topical medications come in in order to help keep track of the instructions about where and how to use these. Pharmacies typically print the medication instructions only on the boxes and not directly on the medication tubes.   If your medication is too expensive, please contact our office at [phone] option 4 or send [place] a message through MyChart.   We are unable to tell what your co-pay for medications will be in advance as this is different depending on your insurance coverage. However, we may be able to find a substitute medication at lower cost or fill out paperwork to get insurance to cover a needed medication.   If a prior authorization is required to get your medication covered by your insurance company, please allow [place] 1-2 business days to complete this process.  Drug prices often vary depending on where the prescription is filled and some pharmacies may offer cheaper prices.  The website www.goodrx.com contains coupons for medications through different pharmacies. The prices here do not account for what the cost may be with help from insurance (it may be cheaper with your insurance), but the website can give you the price if you did not use any insurance.  - You can print the associated coupon and take it with your prescription to the pharmacy.  - You may also stop by our office during regular business hours and pick up a GoodRx coupon card.  - If you need your prescription sent electronically to a different pharmacy, notify our office through [facility] or by phone at [phone] option 4.     Si Usted Necesita Algo Despus de Su Visita  Tambin puede enviarnos un mensaje a travs de Clinical cytogeneticist. Por lo general respondemos a los mensajes de MyChart en el transcurso de 1 a 2 das hbiles.  Para renovar recetas, por favor pida a su farmacia que se ponga en contacto con  nuestra oficina. Annie Sable de fax es [place] [phone].  Si tiene un asunto urgente cuando la clnica est cerrada y que no puede esperar hasta el siguiente da hbil, puede llamar/localizar a su doctor(a) al nmero que aparece a continuacin.   Por favor, tenga en cuenta que aunque hacemos todo lo posible para estar disponibles para asuntos urgentes fuera del horario de [place], no estamos disponibles las 24 horas del da, los 7 [address]  [address] de la [place].   Si tiene un problema urgente y no puede comunicarse con nosotros, puede optar por buscar atencin mdica  en el consultorio de su doctor(a), en una clnica privada, en un centro de atencin urgente o en una sala de emergencias.  Si tiene Engineer, drilling, por favor llame inmediatamente al 911 o vaya a la sala de emergencias.  Nmeros de bper  - Dr. Gwen Pounds: [phone]  - Dra. Roseanne Reno: [phone]  - Dr. Katrinka Blazing: [phone]  En caso de inclemencias del [place], por favor llame a nuestra lnea principal al [phone] para una actualizacin sobre el [place] de cualquier retraso o cierre.  Consejos para la medicacin en dermatologa: Por favor, guarde las cajas en las que vienen los medicamentos de uso tpico para ayudarle a seguir las instrucciones sobre dnde y cmo usarlos. Las farmacias generalmente imprimen las instrucciones del medicamento slo en las cajas y no directamente en los tubos del [place].   Si su medicamento es muy caro, por favor, pngase en contacto con Rolm [place]ala llamando al [phone] y presione la opcin 4 o envenos un mensaje a travs de Clinical cytogeneticist.   No podemos decirle cul ser su copago por los medicamentos por adelantado ya que esto es diferente dependiendo de la cobertura de su seguro. Sin embargo, es posible que podamos encontrar un medicamento sustituto a Audiological scientist un formulario para que el seguro cubra el medicamento que se considera necesario.   Si se requiere una autorizacin  previa para que su compaa de seguros [place] su medicamento, por favor permtanos de 1 a 2 das hbiles para completar [address]  Los precios de los medicamentos varan con frecuencia dependiendo del Environmental consultant de dnde se surte la receta y alguna farmacias pueden ofrecer precios ms baratos.  El sitio web www.goodrx.com tiene cupones para medicamentos de Health and safety inspector. Los precios aqu no tienen en cuenta lo que podra costar con la ayuda del seguro (puede ser ms barato con su seguro), pero el sitio web puede darle el precio si no utiliz Tourist information centre manager.  - Puede imprimir el cupn correspondiente y llevarlo con su receta a la farmacia.  - Tambin puede pasar por nuestra oficina durante el horario de atencin regular y Education officer, museum una tarjeta de cupones de GoodRx.  - Si necesita que su receta se enve electrnicamente a una farmacia diferente, informe a nuestra oficina a travs de MyChart de [place] o por telfono llamando al [phone] y presione la opcin 4.

## 2024-01-12 NOTE — Progress Notes (Signed)
   Follow-Up Visit   Subjective  Sheila Kim is a 83 y.o. female who presents for the following: Spots on left leg and left wrist. Itching, stinging, burning. Tender, red, inflamed, crusty. Dur: 3 months.   The patient has spots, moles and lesions to be evaluated, some may be new or changing and the patient may have concern these could be cancer.    The following portions of the chart were reviewed this encounter and updated as appropriate: medications, allergies, medical history  Review of Systems:  No other skin or systemic complaints except as noted in HPI or Assessment and Plan.  Objective  Well appearing patient in no apparent distress; mood and affect are within normal limits.  A focused examination was performed of the following areas: Left leg, left wrist  Relevant physical exam findings are noted in the Assessment and Plan.  Left Dorsal Wrist 4 mm pink scaly papule  Left Lower Leg - Anterior 1.7 cm pink hyperkeratotic plaque   Assessment & Plan   NEOPLASM OF SKIN (2) Left Dorsal Wrist Skin / nail biopsy Type of biopsy: tangential   Informed consent: discussed and consent obtained   Timeout: patient name, date of birth, surgical site, and procedure verified   Procedure prep:  Patient was prepped and draped in usual sterile fashion Prep type:  Isopropyl alcohol Anesthesia: the lesion was anesthetized in a standard fashion   Anesthetic:  1% lidocaine w/ epinephrine 1-100,000 buffered w/ 8.4% NaHCO3 Instrument used: DermaBlade   Hemostasis achieved with: pressure and aluminum chloride   Outcome: patient tolerated procedure well   Post-procedure details: sterile dressing applied and wound care instructions given   Dressing type: bandage and petrolatum   Specimen 1 - Surgical pathology Differential Diagnosis: R/O SCC  Check Margins: No Left Lower Leg - Anterior Skin / nail biopsy Type of biopsy: tangential   Informed consent: discussed and consent  obtained   Timeout: patient name, date of birth, surgical site, and procedure verified   Procedure prep:  Patient was prepped and draped in usual sterile fashion Prep type:  Isopropyl alcohol Anesthesia: the lesion was anesthetized in a standard fashion   Anesthetic:  1% lidocaine w/ epinephrine 1-100,000 buffered w/ 8.4% NaHCO3 Instrument used: DermaBlade   Hemostasis achieved with: pressure and aluminum chloride   Outcome: patient tolerated procedure well   Post-procedure details: sterile dressing applied and wound care instructions given   Dressing type: bandage and petrolatum   Specimen 2 - Surgical pathology Differential Diagnosis: R/O SCC   Check Margins: No Plan Mohs if + skin cancer if patient can arrange transportation.   Return for TBSE As Scheduled, With Dr. Gwen Pounds.  I, Lawson Radar, CMA, am acting as scribe for Elie Goody, MD.   Documentation: I have reviewed the above documentation for accuracy and completeness, and I agree with the above.  Elie Goody, MD

## 2024-01-13 DIAGNOSIS — C4492 Squamous cell carcinoma of skin, unspecified: Secondary | ICD-10-CM

## 2024-01-13 HISTORY — DX: Squamous cell carcinoma of skin, unspecified: C44.92

## 2024-01-17 LAB — SURGICAL PATHOLOGY

## 2024-01-18 ENCOUNTER — Telehealth: Payer: Self-pay

## 2024-01-18 NOTE — Telephone Encounter (Addendum)
 Called and discussed results with patient. She was given option to have Mohs which was discussed with her at last appointment with Dr. Katrinka Blazing or have ED&C preformed by Dr. Roseanne Reno or Dr. Gwen Pounds.  Patient would prefer to have [facility] treatment here in [place] with Dr. Gwen Pounds. Scheduled 02/02/2024 at 12:05.   ----- Message from Willeen Niece sent at 01/17/2024  6:42 PM EDT ----- 1. Skin, left dorsal wrist :       CONSISTENT WITH SURFACE OF AN EPIDERMOID CYST  2. Skin, left lower leg - anterior :       SQUAMOUS CELL CARCINOMA, KERATOACANTHOMA TYPE    1. Benign cyst 2. SCC skin cancer, since KA type, would recommend [facility] - please call patient

## 2024-01-18 NOTE — Telephone Encounter (Signed)
 Patient confirmed with cardiologist that she does not have to stop Eliquis or start antibiotic. aw

## 2024-01-22 ENCOUNTER — Other Ambulatory Visit: Payer: Self-pay | Admitting: Family Medicine

## 2024-01-24 ENCOUNTER — Telehealth: Payer: Self-pay

## 2024-01-24 MED ORDER — POTASSIUM CHLORIDE CRYS ER 20 MEQ PO TBCR: 20.0000 meq | EXTENDED_RELEASE_TABLET | Freq: Every day | ORAL | 3 refills | Status: DC

## 2024-01-24 NOTE — Telephone Encounter (Signed)
 Unable to refill per protocol, Rx expired. Discontinued 05/29/23.  Requested Prescriptions  Pending Prescriptions Disp Refills   potassium chloride SA (KLOR-CON M) 20 MEQ tablet [Pharmacy Med Name: Potassium Chloride Crys ER 20 MEQ Oral Tablet Extended Release] 100 tablet 3    Sig: TAKE 1 TABLET BY MOUTH DAILY     Endocrinology:  Minerals - Potassium Supplementation Failed - 01/24/2024  9:29 AM      Failed - Cr in normal range and within 360 days    Creat  Date Value Ref Range Status  09/28/2017 1.12 (H) 0.60 - 0.93 mg/dL Final    Comment:    For patients >32 years of age, the reference limit for Creatinine is approximately 13% higher for people identified as African-American. .    Creatinine, Ser  Date Value Ref Range Status  11/17/2023 1.21 (H) 0.57 - 1.00 mg/dL Final         Passed - K in normal range and within 360 days    Potassium  Date Value Ref Range Status  11/17/2023 5.0 3.5 - 5.2 mmol/L Final  01/09/2014 4.3 3.5 - 5.1 mmol/L Final         Passed - Valid encounter within last 12 months    Recent Outpatient Visits           2 months ago Encounter for annual wellness visit (AWV) in Medicare patient   [facility] [place], Stan Eans, MD       Future Appointments             In 1 week Elta Halter, MD [facility]   In 3 months Bacigalupo, Stan Eans, MD [facility]   In 4 months Elta Halter, MD [facility]

## 2024-01-24 NOTE — Telephone Encounter (Signed)
 Copied from CRM [id number]. Topic: Clinical - Prescription Issue >> Jan 24, 2024 11:09 AM [place] wrote: Reason for CRM: potassium chloride SA (KLOR-CON M) CR tablet 40 mEq- refill denied per pharmacy- please advise patient of any issues [phone] pharmacy said to send prescription to  [facility] [id number]

## 2024-01-26 NOTE — Telephone Encounter (Signed)
 Pt reports she was taking potassium at the time of last levels.  Rx was not cancelled

## 2024-01-26 NOTE — Telephone Encounter (Signed)
 Can you call patient and see if she was taking potassium when her last K was checked (Feb). If she was, we should continue it, but if that was without potassium, then we do not mean to add it and would cancel with the pharmacy.

## 2024-02-02 ENCOUNTER — Encounter: Payer: Self-pay | Admitting: Dermatology

## 2024-02-02 ENCOUNTER — Ambulatory Visit: Admitting: Dermatology

## 2024-02-02 DIAGNOSIS — C44729 Squamous cell carcinoma of skin of left lower limb, including hip: Secondary | ICD-10-CM

## 2024-02-02 DIAGNOSIS — C4492 Squamous cell carcinoma of skin, unspecified: Secondary | ICD-10-CM

## 2024-02-02 NOTE — Progress Notes (Signed)
   Follow-Up Visit   Subjective  Sheila Kim is a 83 y.o. 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  The patient has spots, moles and lesions to be evaluated, some may be new or changing and the patient may have concern these could be cancer.  The following portions of the chart were reviewed this encounter and updated as appropriate: medications, allergies, medical history  Review of Systems:  No other skin or systemic complaints except as noted in HPI or Assessment and Plan.  Objective  Well appearing patient in no apparent distress; mood and affect are within normal limits.  A focused examination was performed of the following areas: Left leg  Relevant exam findings are noted in the Assessment and Plan.  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  Anterior - BIOPSY PROVEN Destruction of lesion Complexity: extensive   Destruction method: electrodesiccation and curettage   Informed consent: discussed and consent obtained   Timeout:  patient name, date of birth, surgical site, and procedure verified Procedure prep:  Patient was prepped and draped in usual sterile fashion Prep type:  Isopropyl alcohol Anesthesia: the lesion was anesthetized in a standard fashion   Anesthetic:  1% lidocaine  w/ epinephrine 1-100,000 buffered w/ 8.4% NaHCO3 Curettage performed in three different directions: Yes   Electrodesiccation performed over the curetted area: Yes   Lesion length (cm):  2.2 Lesion width (cm):  2.2 Final wound size (cm):  2.2 Hemostasis achieved with:  pressure, aluminum chloride and electrodesiccation Outcome: patient tolerated procedure well with no complications   Post-procedure details: sterile dressing applied and wound care instructions given   Dressing type: bandage and petrolatum    Epidermal / dermal shaving  Lesion  diameter (cm):  2.2 Informed consent: discussed and consent obtained   Timeout: patient name, date of birth, surgical site, and procedure verified   Procedure prep:  Patient was prepped and draped in usual sterile fashion Prep type:  Isopropyl alcohol Anesthesia: the lesion was anesthetized in a standard fashion   Anesthetic:  1% lidocaine  w/ epinephrine 1-100,000 buffered w/ 8.4% NaHCO3 Instrument used: DermaBlade   Hemostasis achieved with: pressure, aluminum chloride and electrodesiccation   Outcome: patient tolerated procedure well   Post-procedure details: sterile dressing applied and wound care instructions given   Dressing type: bandage and petrolatum    Return for TBSE, as scheduled, with Dr. Almeda Aris, RMA, am acting as scribe for Celine Collard, MD .   Documentation: I have reviewed the above documentation for accuracy and completeness, and I agree with the above.  Celine Collard, MD

## 2024-02-02 NOTE — Patient Instructions (Signed)

## 2024-02-06 ENCOUNTER — Ambulatory Visit: Admitting: Dermatology

## 2024-03-09 ENCOUNTER — Other Ambulatory Visit: Payer: Self-pay | Admitting: Family Medicine

## 2024-03-09 DIAGNOSIS — Z1231 Encounter for screening mammogram for malignant neoplasm of breast: Secondary | ICD-10-CM

## 2024-04-04 DIAGNOSIS — Z950 Presence of cardiac pacemaker: Secondary | ICD-10-CM | POA: Diagnosis not present

## 2024-04-04 DIAGNOSIS — I442 Atrioventricular block, complete: Secondary | ICD-10-CM | POA: Diagnosis not present

## 2024-04-16 ENCOUNTER — Ambulatory Visit
Admission: RE | Admit: 2024-04-16 | Discharge: 2024-04-16 | Disposition: A | Discharge status: Home / Self Care | Source: Ambulatory Visit | Attending: Family Medicine | Admitting: Family Medicine

## 2024-04-16 DIAGNOSIS — Z1231 Encounter for screening mammogram for malignant neoplasm of breast: Secondary | ICD-10-CM | POA: Insufficient documentation

## 2024-04-24 ENCOUNTER — Ambulatory Visit: Payer: Self-pay | Admitting: Family Medicine

## 2024-04-30 DIAGNOSIS — Z951 Presence of aortocoronary bypass graft: Secondary | ICD-10-CM | POA: Diagnosis not present

## 2024-04-30 DIAGNOSIS — R079 Chest pain, unspecified: Secondary | ICD-10-CM | POA: Diagnosis not present

## 2024-04-30 DIAGNOSIS — I2510 Atherosclerotic heart disease of native coronary artery without angina pectoris: Secondary | ICD-10-CM | POA: Diagnosis not present

## 2024-04-30 DIAGNOSIS — R0789 Other chest pain: Secondary | ICD-10-CM | POA: Diagnosis not present

## 2024-04-30 DIAGNOSIS — E785 Hyperlipidemia, unspecified: Secondary | ICD-10-CM | POA: Diagnosis not present

## 2024-04-30 DIAGNOSIS — I10 Essential (primary) hypertension: Secondary | ICD-10-CM | POA: Diagnosis not present

## 2024-04-30 DIAGNOSIS — I213 ST elevation (STEMI) myocardial infarction of unspecified site: Secondary | ICD-10-CM | POA: Diagnosis not present

## 2024-05-07 DIAGNOSIS — R0789 Other chest pain: Secondary | ICD-10-CM | POA: Diagnosis not present

## 2024-05-14 DIAGNOSIS — I21A1 Myocardial infarction type 2: Secondary | ICD-10-CM | POA: Diagnosis not present

## 2024-05-14 DIAGNOSIS — E785 Hyperlipidemia, unspecified: Secondary | ICD-10-CM | POA: Diagnosis not present

## 2024-05-14 DIAGNOSIS — I10 Essential (primary) hypertension: Secondary | ICD-10-CM | POA: Diagnosis not present

## 2024-05-14 DIAGNOSIS — Z951 Presence of aortocoronary bypass graft: Secondary | ICD-10-CM | POA: Diagnosis not present

## 2024-05-14 DIAGNOSIS — I213 ST elevation (STEMI) myocardial infarction of unspecified site: Secondary | ICD-10-CM | POA: Diagnosis not present

## 2024-05-14 DIAGNOSIS — I2510 Atherosclerotic heart disease of native coronary artery without angina pectoris: Secondary | ICD-10-CM | POA: Diagnosis not present

## 2024-05-17 ENCOUNTER — Ambulatory Visit (INDEPENDENT_AMBULATORY_CARE_PROVIDER_SITE_OTHER): Payer: Medicare Other | Admitting: Family Medicine

## 2024-05-17 ENCOUNTER — Encounter: Payer: Self-pay | Admitting: Family Medicine

## 2024-05-17 VITALS — BP 130/80 | HR 60 | Ht <= 58 in | Wt 116.2 lb

## 2024-05-17 DIAGNOSIS — R7303 Prediabetes: Secondary | ICD-10-CM

## 2024-05-17 DIAGNOSIS — E7800 Pure hypercholesterolemia, unspecified: Secondary | ICD-10-CM | POA: Diagnosis not present

## 2024-05-17 DIAGNOSIS — I130 Hypertensive heart and chronic kidney disease with heart failure and stage 1 through stage 4 chronic kidney disease, or unspecified chronic kidney disease: Secondary | ICD-10-CM | POA: Diagnosis not present

## 2024-05-17 DIAGNOSIS — H6123 Impacted cerumen, bilateral: Secondary | ICD-10-CM | POA: Diagnosis not present

## 2024-05-17 DIAGNOSIS — N1832 Chronic kidney disease, stage 3b: Secondary | ICD-10-CM

## 2024-05-17 DIAGNOSIS — I442 Atrioventricular block, complete: Secondary | ICD-10-CM

## 2024-05-17 DIAGNOSIS — I10 Essential (primary) hypertension: Secondary | ICD-10-CM | POA: Diagnosis not present

## 2024-05-17 NOTE — Assessment & Plan Note (Signed)
 Currently well controlled on medication regimen. Denies any symptoms or adverse medication side effects. Last CMP was stable. In office reading of 160/96, but record of home measurements run between 110-130s/50-60s which is satisfactory. - Continue 1/2 tablet lasix  and propanolol daily - Order CMP

## 2024-05-17 NOTE — Assessment & Plan Note (Signed)
 Currently well controlled on lifestyle modifications. Denies any symptoms. Last A1C was stable at 6.3. - Continue lifestyle modifications - Order A1C

## 2024-05-17 NOTE — Progress Notes (Signed)
 Established Patient Office Visit  Subjective   Patient ID: Sheila Kim, female    DOB: May 07, 1941  Age: 83 y.o. MRN: [id number]  Chief Complaint  Patient presents with   Medical Management of Chronic Issues    Patient reports she had a chemical stress test done recently, wanted to go over that Patient reports she is feeling pretty good, states her pacemaker seems to be doing good.     Sheila Kim is a 83 yo F with a history of HTN, HLD, prediabetes, and CKD who presents to the clinic for follow up management of her chronic conditions.  On interview, she reports feeling well. She states that she has been managing her HTN and CKD well at home with reassuring readings and management with nephrology. She reports eating well and exercising to help manage her HLD and prediabetes. She has been following up recently with cardiology because of her pacemaker and some chest pain. She had a chemical stress test that she would like to discuss today. She also has concerns about impacted ear wax in both ears as she has been feeling some pressure. She would like to get her ears cleared today.   She reports no other concerns.      ROS    Objective:     BP 130/80 Comment: home reading  Pulse 60   Ht 4' 8 (1.422 m)   Wt 116 lb 3.2 oz (52.7 kg)   SpO2 100%   BMI 26.05 kg/m    Physical Exam Vitals reviewed.  Constitutional:      General: She is not in acute distress.    Appearance: Normal appearance. She is not ill-appearing or diaphoretic.  HENT:     Head: Normocephalic.     Right Ear: Ear canal and external ear normal. There is impacted cerumen.     Left Ear: Ear canal and external ear normal. There is impacted cerumen.     Nose: Nose normal.     Mouth/Throat:     Mouth: Mucous membranes are moist.     Pharynx: Oropharynx is clear. No oropharyngeal exudate or posterior oropharyngeal erythema.  Eyes:     General: No scleral icterus.    Conjunctiva/sclera:  Conjunctivae normal.     Pupils: Pupils are equal, round, and reactive to light.  Cardiovascular:     Rate and Rhythm: Normal rate and regular rhythm.     Pulses: Normal pulses.     Heart sounds: Normal heart sounds. No murmur heard.    No friction rub. No gallop.  Pulmonary:     Effort: Pulmonary effort is normal. No respiratory distress.     Breath sounds: Normal breath sounds. No wheezing.  Abdominal:     General: There is no distension.     Palpations: Abdomen is soft.     Tenderness: There is no abdominal tenderness. There is no guarding.  Musculoskeletal:     Cervical back: Normal range of motion.     Right lower leg: No edema.     Left lower leg: No edema.  Lymphadenopathy:     Cervical: No cervical adenopathy.  Skin:    General: Skin is warm and dry.     Capillary Refill: Capillary refill takes less than 2 seconds.  Neurological:     Mental Status: She is alert and oriented to person, place, and time.  Psychiatric:        Mood and Affect: Mood normal.      No results found for  any visits on 05/17/24.    The ASCVD Risk score (Arnett DK, et al., 2019) failed to calculate for the following reasons:   The 2019 ASCVD risk score is only valid for ages 78 to 66   Risk score cannot be calculated because patient has a medical history suggesting prior/existing ASCVD    Assessment & Plan:   Problem List Items Addressed This Visit     CKD (chronic kidney disease)   Currently well managed and followed by nephrology. Denies any symptoms or adverse effects. Last CMP was stable. - Continue current management and follow up with nephrology as scheduled - Order CMP       Relevant Orders   Comprehensive Metabolic Panel (CMET)   Hypercholesteremia   Currently well controlled on medication regimen. Denies any symptoms or adverse medication side effects. Last FLP was stable. - Continue Crestor  5 mg 5 days a week as scheduled - Order FLP       Relevant Medications    isosorbide mononitrate (IMDUR) 30 MG 24 hr tablet   Other Relevant Orders   Comprehensive Metabolic Panel (CMET)   Lipid Profile   Prediabetes   Currently well controlled on lifestyle modifications. Denies any symptoms. Last A1C was stable at 6.3. - Continue lifestyle modifications - Order A1C       Relevant Orders   HgB A1c   BP (high blood pressure)   Currently well controlled on medication regimen. Denies any symptoms or adverse medication side effects. Last CMP was stable. In office reading of 160/96, but record of home measurements run between 110-130s/50-60s which is satisfactory. - Continue 1/2 tablet lasix  and propanolol daily - Order CMP       Relevant Medications   isosorbide mononitrate (IMDUR) 30 MG 24 hr tablet   Other Relevant Orders   Comprehensive Metabolic Panel (CMET)   Heart & renal disease, hypertensive, 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HCC) - Primary   Managed for BP as described in that section. Also followed by cardiology. - Continue follow up with cardiology as scheduled      Relevant Medications   isosorbide mononitrate (IMDUR) 30 MG 24 hr tablet   Complete heart block (HCC)   Managed by cardiology. Pacemaker placed in 2024 and on eliquis . - Continue cardiology follow up as scheduled      Relevant Medications   isosorbide mononitrate (IMDUR) 30 MG 24 hr tablet   Other Visit Diagnoses       Bilateral impacted cerumen          Bilateral impacted cerumen Presents with bilateral impacted cerumen that she would like to have cleaned. Reports no other symptoms and intact hearing. - Cleaned impacted cerumen  General Health Maintenance Discussed wellness and preventative health screenings to achieve health maintenance goals. - Encouraged flu and Covid vaccinations in the fall - Declines DEXA scan - Up to date on other wellness measures and preventative screenings   Return in about 6 months (around 11/17/2024) for CPE, AWV.    [name], Medical  Student   Patient seen along with MS3 student, Elia Blanch. I personally evaluated this patient along with the student, and verified all aspects of the history, physical exam, and medical decision making as documented by the student. I agree with the student's documentation and have made all necessary edits.  Balbina Depace, Jon HERO, MD, MPH [facility]

## 2024-05-17 NOTE — Assessment & Plan Note (Signed)
 Managed by cardiology. Pacemaker placed in 2024 and on eliquis . - Continue cardiology follow up as scheduled

## 2024-05-17 NOTE — Assessment & Plan Note (Addendum)
 Managed for BP as described in that section. Also followed by cardiology. - Continue follow up with cardiology as scheduled

## 2024-05-17 NOTE — Assessment & Plan Note (Signed)
 Currently well controlled on medication regimen. Denies any symptoms or adverse medication side effects. Last FLP was stable. - Continue Crestor  5 mg 5 days a week as scheduled - Order FLP

## 2024-05-17 NOTE — Assessment & Plan Note (Signed)
 Currently well managed and followed by nephrology. Denies any symptoms or adverse effects. Last CMP was stable. - Continue current management and follow up with nephrology as scheduled - Order CMP

## 2024-05-19 LAB — COMPREHENSIVE METABOLIC PANEL WITH GFR
ALT: 18 IU/L (ref 0–32)
AST: 26 IU/L (ref 0–40)
Albumin: 4.7 g/dL (ref 3.7–4.7)
Alkaline Phosphatase: 61 IU/L (ref 44–121)
BUN/Creatinine Ratio: 13 (ref 12–28)
BUN: 17 mg/dL (ref 8–27)
Bilirubin Total: 1.1 mg/dL (ref 0.0–1.2)
CO2: 23 mmol/L (ref 20–29)
Calcium: 9.6 mg/dL (ref 8.7–10.3)
Chloride: 95 mmol/L — ABNORMAL LOW (ref 96–106)
Creatinine, Ser: 1.27 mg/dL — ABNORMAL HIGH (ref 0.57–1.00)
Globulin, Total: 2.5 g/dL (ref 1.5–4.5)
Glucose: 113 mg/dL — ABNORMAL HIGH (ref 70–99)
Potassium: 4.6 mmol/L (ref 3.5–5.2)
Sodium: 137 mmol/L (ref 134–144)
Total Protein: 7.2 g/dL (ref 6.0–8.5)
eGFR: 41 mL/min/1.73 — ABNORMAL LOW (ref >59)

## 2024-05-19 LAB — LIPID PANEL
Chol/HDL Ratio: 3.6 ratio (ref 0.0–4.4)
Cholesterol, Total: 203 mg/dL — ABNORMAL HIGH (ref 100–199)
HDL: 56 mg/dL (ref >39)
LDL Chol Calc (NIH): 119 mg/dL — ABNORMAL HIGH (ref 0–99)
Triglycerides: 160 mg/dL — ABNORMAL HIGH (ref 0–149)
VLDL Cholesterol Cal: 28 mg/dL (ref 5–40)

## 2024-05-19 LAB — HEMOGLOBIN A1C
Est. average glucose Bld gHb Est-mCnc: 126 mg/dL
Hgb A1c MFr Bld: 6 % — ABNORMAL HIGH (ref 4.8–5.6)

## 2024-05-30 ENCOUNTER — Ambulatory Visit: Payer: Medicare Other | Admitting: Dermatology

## 2024-05-30 ENCOUNTER — Encounter: Payer: Self-pay | Admitting: Dermatology

## 2024-05-30 DIAGNOSIS — L814 Other melanin hyperpigmentation: Secondary | ICD-10-CM | POA: Diagnosis not present

## 2024-05-30 DIAGNOSIS — L570 Actinic keratosis: Secondary | ICD-10-CM | POA: Diagnosis not present

## 2024-05-30 DIAGNOSIS — Z1283 Encounter for screening for malignant neoplasm of skin: Secondary | ICD-10-CM

## 2024-05-30 DIAGNOSIS — D1801 Hemangioma of skin and subcutaneous tissue: Secondary | ICD-10-CM | POA: Diagnosis not present

## 2024-05-30 DIAGNOSIS — L821 Other seborrheic keratosis: Secondary | ICD-10-CM

## 2024-05-30 DIAGNOSIS — Z7189 Other specified counseling: Secondary | ICD-10-CM

## 2024-05-30 DIAGNOSIS — L820 Inflamed seborrheic keratosis: Secondary | ICD-10-CM

## 2024-05-30 DIAGNOSIS — W908XXA Exposure to other nonionizing radiation, initial encounter: Secondary | ICD-10-CM

## 2024-05-30 DIAGNOSIS — L578 Other skin changes due to chronic exposure to nonionizing radiation: Secondary | ICD-10-CM | POA: Diagnosis not present

## 2024-05-30 DIAGNOSIS — S1096XA Insect bite of unspecified part of neck, initial encounter: Secondary | ICD-10-CM

## 2024-05-30 DIAGNOSIS — W57XXXA Bitten or stung by nonvenomous insect and other nonvenomous arthropods, initial encounter: Secondary | ICD-10-CM

## 2024-05-30 DIAGNOSIS — Z8589 Personal history of malignant neoplasm of other organs and systems: Secondary | ICD-10-CM | POA: Diagnosis not present

## 2024-05-30 DIAGNOSIS — D229 Melanocytic nevi, unspecified: Secondary | ICD-10-CM

## 2024-05-30 NOTE — Progress Notes (Signed)
 Follow-Up Visit   Subjective  Sheila Kim is a 83 y.o. female who presents for the following: Skin Cancer Screening and Full Body Skin Exam. Hx of SCC. Hx of AKs.   The patient presents for Total-Body Skin Exam (TBSE) for skin cancer screening and mole check. The patient has spots, moles and lesions to be evaluated, some may be new or changing and the patient may have concern these could be cancer.  The following portions of the chart were reviewed this encounter and updated as appropriate: medications, allergies, medical history  Review of Systems:  No other skin or systemic complaints except as noted in HPI or Assessment and Plan.  Objective  Well appearing patient in no apparent distress; mood and affect are within normal limits.  A full examination was performed including scalp, head, eyes, ears, nose, lips, neck, chest, axillae, abdomen, back, buttocks, bilateral upper extremities, bilateral lower extremities, hands, feet, fingers, toes, fingernails, and toenails. All findings within normal limits unless otherwise noted below.   Relevant physical exam findings are noted in the Assessment and Plan.  L middle nose x1, R nasal bridge x1 (2) Erythematous thin papules/macules with gritty scale.  Right Upper Cutaneous Lip x1 Erythematous keratotic or waxy stuck-on papule or plaque.  Assessment & Plan   SKIN CANCER SCREENING PERFORMED TODAY.  HISTORY OF SQUAMOUS CELL CARCINOMA OF THE SKIN. Left lower anterior leg. KA type. [facility] 02/02/2024. - Violaceous scar with slight induration - No evidence of recurrence today - No lymphadenopathy - Recommend regular full body skin exams - Recommend daily broad spectrum sunscreen SPF 30+ to sun-exposed areas, reapply every 2 hours as needed.  - Call if any new or changing lesions are noted between office visits  ACTINIC DAMAGE - Chronic condition, secondary to cumulative UV/sun exposure - diffuse scaly erythematous macules with  underlying dyspigmentation - Recommend daily broad spectrum sunscreen SPF 30+ to sun-exposed areas, reapply every 2 hours as needed.  - Staying in the shade or wearing long sleeves, sun glasses (UVA+UVB protection) and wide brim hats (4-inch brim around the entire circumference of the hat) are also recommended for sun protection.  - Call for new or changing lesions.  LENTIGINES, SEBORRHEIC KERATOSES, HEMANGIOMAS - Benign normal skin lesions - Benign-appearing - Call for any changes  MELANOCYTIC NEVI - Tan-brown and/or pink-flesh-colored symmetric macules and papules - Benign appearing on exam today - Observation - Call clinic for new or changing moles - Recommend daily use of broad spectrum spf 30+ sunscreen to sun-exposed areas.   EXCORIATION, secondary to mosquito bite Exam: Excoriation at upper back/post neck Treatment Plan: Recommend vaseline. Call if not resolving.  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 They are chronic with expected duration over 1 year. A portion of actinic keratoses will progress to squamous cell carcinoma of the skin. It is not possible to reliably predict which spots will progress to skin cancer and so treatment is recommended to prevent development of skin cancer.  Recommend daily broad spectrum sunscreen SPF 30+ to sun-exposed areas, reapply every 2 hours as needed.  Recommend staying in the shade or wearing long sleeves, sun glasses (UVA+UVB protection) and wide brim hats (4-inch brim around the entire circumference of the hat). Call for new or changing lesions. Destruction of lesion - L middle nose x1, R nasal bridge x1 (2) Complexity: simple   Destruction  method: cryotherapy   Informed consent: discussed and consent obtained   Timeout:  patient name, date of birth, surgical site, and procedure  verified Lesion destroyed using liquid nitrogen: Yes   Region frozen until ice ball extended beyond lesion: Yes   Outcome: patient tolerated procedure well with no complications   Post-procedure details: wound care instructions given   Additional details:  Prior to procedure, discussed risks of blister formation, small wound, skin dyspigmentation, or rare scar following cryotherapy. Recommend Vaseline ointment to treated areas while healing.   INFLAMED SEBORRHEIC KERATOSIS Right Upper Cutaneous Lip x1 Symptomatic, irritating, patient would like treated. Destruction of lesion - Right Upper Cutaneous Lip x1 Complexity: simple   Destruction method: cryotherapy   Informed consent: discussed and consent obtained   Timeout:  patient name, date of birth, surgical site, and procedure verified Lesion destroyed using liquid nitrogen: Yes   Region frozen until ice ball extended beyond lesion: Yes   Outcome: patient tolerated procedure well with no complications   Post-procedure details: wound care instructions given   Additional details:  Prior to procedure, discussed risks of blister formation, small wound, skin dyspigmentation, or rare scar following cryotherapy. Recommend Vaseline ointment to treated areas while healing.   Return in about 1 year (around 05/30/2025) for TBSE, HxSCC.  I, Jill Parcell, CMA, am acting as scribe for Alm Rhyme, MD.   Documentation: I have reviewed the above documentation for accuracy and completeness, and I agree with the above.  Alm Rhyme, MD

## 2024-05-30 NOTE — Patient Instructions (Addendum)

## 2024-06-07 ENCOUNTER — Ambulatory Visit (INDEPENDENT_AMBULATORY_CARE_PROVIDER_SITE_OTHER): Admitting: Family Medicine

## 2024-06-07 ENCOUNTER — Encounter: Payer: Self-pay | Admitting: Family Medicine

## 2024-06-07 VITALS — BP 122/81 | HR 61 | Temp 98.6°F | Ht <= 58 in | Wt 118.4 lb

## 2024-06-07 DIAGNOSIS — H60322 Hemorrhagic otitis externa, left ear: Secondary | ICD-10-CM | POA: Diagnosis not present

## 2024-06-07 DIAGNOSIS — H6122 Impacted cerumen, left ear: Secondary | ICD-10-CM | POA: Diagnosis not present

## 2024-06-07 MED ORDER — CIPROFLOXACIN-DEXAMETHASONE 0.3-0.1 % OT SUSP: 4.0000 [drp] | Freq: Two times a day (BID) | OTIC | 0 refills | Status: AC

## 2024-06-07 NOTE — Progress Notes (Signed)
 Established patient visit   Patient: Sheila Kim   DOB: [birth year]   83 y.o. Female  MRN: [id number] Visit Date: 06/07/2024  Today's healthcare provider: Jon Eva, MD   Chief Complaint  Patient presents with   Follow-up    Patient reports that she is here for a follow up for an ear irrigation on both.  Stated that she was to use peroxide for 2 weeks and then come back to see about getting the irrigated again.  Reports that there was some wax that came out of the right ear but not much in the left.   Subjective    HPI HPI     Follow-up    Additional comments: Patient reports that she is here for a follow up for an ear irrigation on both.  Stated that she was to use peroxide for 2 weeks and then come back to see about getting the irrigated again.  Reports that there was some wax that came out of the right ear but not much in the left.      Last edited by Terrel Powell CROME, CMA on 06/07/2024  9:17 AM.       Discussed the use of AI scribe software for clinical note transcription with the patient, who gave verbal consent to proceed.  History of Present Illness   Sheila Kim is an 83 year old female who presents with persistent ear wax buildup in the left ear.  For the past three weeks, she has experienced persistent ear wax buildup in her left ear. She has used ear drops and cotton balls to soften the wax, resulting in itching and discomfort, but the ear remains clogged. Previously, her right ear was affected but is no longer an issue. The blockage in the left ear is particularly bothersome at night, interfering with her sleep. She avoids using Q-tips to prevent further compaction of the wax.         Medications: Outpatient Medications Prior to Visit  Medication Sig   acetaminophen  (TYLENOL ) 500 MG tablet Take 500 mg by mouth. In the PM and as needed   acetaminophen  (TYLENOL ) 650 MG CR tablet Take 650 mg by mouth. 2 tablets in the AM and 1  tablet in the PM   apixaban  (ELIQUIS ) 2.5 MG TABS tablet Take 1 tablet (2.5 mg total) by mouth 2 (two) times daily.   ASPERCREME LIDOCAINE  EX Apply 1 Application topically as needed. Roll on. Use every day   Calcium  Magnesium Zinc 333-133-5 MG TABS Take 1 tablet by mouth daily.   cholecalciferol (VITAMIN D ) 1000 UNITS tablet Take 1,000 Units by mouth daily.    Coenzyme Q10 (COQ10) 100 MG CAPS Take 200 mg by mouth daily.    cyanocobalamin 1000 MCG tablet Take 1,000 mcg by mouth daily.    famotidine (PEPCID) 20 MG tablet Take 20 mg by mouth 2 (two) times daily.   ferrous sulfate 325 (65 FE) MG tablet Take 325 mg by mouth daily with breakfast.    fluticasone  (FLONASE ) 50 MCG/ACT nasal spray Place 2 sprays into both nostrils daily.   folic acid (FOLVITE) 1 MG tablet Take 1 mg by mouth daily.    furosemide  (LASIX ) 40 MG tablet TAKE ONE-HALF TABLET BY MOUTH  TWICE DAILY   isosorbide mononitrate (IMDUR) 30 MG 24 hr tablet Take 30 mg by mouth daily.   ketotifen (ZADITOR) 0.025 % ophthalmic solution Place 1 drop into both eyes as needed.   loratadine  (CLARITIN ) 10 MG  tablet Take 10 mg by mouth daily.    MELATONIN EXTRA STRENGTH PO Take 0.25 tablets by mouth at bedtime as needed.    Multiple Vitamins-Minerals ([employer] SPECTRAVITE ADULT 50+ PO) Spectravite Ultra Women's Health Senior - Historical Medication  daily Active   pantoprazole  (PROTONIX ) 40 MG tablet TAKE 1 TABLET BY MOUTH TWICE  DAILY   Polyethyl Glycol-Propyl Glycol 0.4-0.3 % SOLN Apply to eye as needed.   polyethylene glycol (MIRALAX  / GLYCOLAX ) packet Take 17 g by mouth daily.   potassium chloride  SA (KLOR-CON  M) 20 MEQ tablet Take 1 tablet (20 mEq total) by mouth daily.   propranolol  (INDERAL ) 40 MG tablet TAKE ONE-HALF TABLET BY MOUTH  TWICE DAILY   Psyllium Husk POWD Take by mouth daily.    pyridoxine (B-6) 100 MG tablet Take 100 mg by mouth daily.    rosuvastatin  (CRESTOR ) 5 MG tablet TAKE 1 TABLET BY MOUTH 5 TIMES  WEEKLY   No  facility-administered medications prior to visit.    Review of Systems     Objective    BP 122/81 (BP Location: Left Arm, Patient Position: Sitting, Cuff Size: Normal)   Pulse 61   Temp 98.6 F (37 C) (Oral)   Ht 4' 8 (1.422 m)   Wt 118 lb 6.4 oz (53.7 kg)   SpO2 100%   BMI 26.54 kg/m    Physical Exam HENT:     Right Ear: Tympanic membrane, ear canal and external ear normal.     Left Ear: Ear canal and external ear normal. There is impacted cerumen.      No results found for any visits on 06/07/24.  Assessment & Plan     Problem List Items Addressed This Visit   None Visit Diagnoses       Impacted cerumen of left ear    -  Primary   Relevant Orders   Ambulatory referral to ENT     Acute hemorrhagic otitis externa of left ear       Relevant Orders   Ambulatory referral to ENT           Cerumen impaction Persistent cerumen impaction in the left ear despite previous softening attempts. The right ear is clear, and the left ear wax appears softer, increasing the likelihood of successful removal today. Consider ENT referral for specialized removal if unsuccessful. - Attempt cerumen removal from the left ear - Refer to ENT if unsuccessful     CMA attempted lavage for cerumen impaction, but patient experienced pain and bleeding of the canal. Will refer to ENT for further management and give ciprodex  ear drops for irritation of canal.  No follow-ups on file.       Jon Eva, MD  [facility] [phone] (phone) [phone] (fax)  [facility]

## 2024-06-13 DIAGNOSIS — H6122 Impacted cerumen, left ear: Secondary | ICD-10-CM | POA: Diagnosis not present

## 2024-06-13 DIAGNOSIS — H60332 Swimmer's ear, left ear: Secondary | ICD-10-CM | POA: Diagnosis not present

## 2024-06-13 DIAGNOSIS — J301 Allergic rhinitis due to pollen: Secondary | ICD-10-CM | POA: Diagnosis not present

## 2024-06-13 DIAGNOSIS — H6981 Other specified disorders of Eustachian tube, right ear: Secondary | ICD-10-CM | POA: Diagnosis not present

## 2024-07-02 DIAGNOSIS — R8290 Unspecified abnormal findings in urine: Secondary | ICD-10-CM | POA: Diagnosis not present

## 2024-07-02 DIAGNOSIS — R600 Localized edema: Secondary | ICD-10-CM | POA: Diagnosis not present

## 2024-07-02 DIAGNOSIS — N2581 Secondary hyperparathyroidism of renal origin: Secondary | ICD-10-CM | POA: Diagnosis not present

## 2024-07-02 DIAGNOSIS — I10 Essential (primary) hypertension: Secondary | ICD-10-CM | POA: Diagnosis not present

## 2024-07-04 DIAGNOSIS — Z23 Encounter for immunization: Secondary | ICD-10-CM | POA: Diagnosis not present

## 2024-07-04 DIAGNOSIS — I2510 Atherosclerotic heart disease of native coronary artery without angina pectoris: Secondary | ICD-10-CM | POA: Diagnosis not present

## 2024-07-04 DIAGNOSIS — I10 Essential (primary) hypertension: Secondary | ICD-10-CM | POA: Diagnosis not present

## 2024-07-04 DIAGNOSIS — Z951 Presence of aortocoronary bypass graft: Secondary | ICD-10-CM | POA: Diagnosis not present

## 2024-07-04 DIAGNOSIS — I213 ST elevation (STEMI) myocardial infarction of unspecified site: Secondary | ICD-10-CM | POA: Diagnosis not present

## 2024-07-04 DIAGNOSIS — E785 Hyperlipidemia, unspecified: Secondary | ICD-10-CM | POA: Diagnosis not present

## 2024-07-04 DIAGNOSIS — I21A1 Myocardial infarction type 2: Secondary | ICD-10-CM | POA: Diagnosis not present

## 2024-07-06 DIAGNOSIS — J301 Allergic rhinitis due to pollen: Secondary | ICD-10-CM | POA: Diagnosis not present

## 2024-07-06 DIAGNOSIS — H60332 Swimmer's ear, left ear: Secondary | ICD-10-CM | POA: Diagnosis not present

## 2024-07-10 ENCOUNTER — Other Ambulatory Visit: Payer: Self-pay

## 2024-07-10 DIAGNOSIS — I129 Hypertensive chronic kidney disease with stage 1 through stage 4 chronic kidney disease, or unspecified chronic kidney disease: Secondary | ICD-10-CM | POA: Diagnosis not present

## 2024-07-10 DIAGNOSIS — I10 Essential (primary) hypertension: Secondary | ICD-10-CM | POA: Diagnosis not present

## 2024-07-10 DIAGNOSIS — R600 Localized edema: Secondary | ICD-10-CM | POA: Diagnosis not present

## 2024-07-10 DIAGNOSIS — N2581 Secondary hyperparathyroidism of renal origin: Secondary | ICD-10-CM | POA: Diagnosis not present

## 2024-07-10 MED ORDER — COMIRNATY 30 MCG/0.3ML IM SUSY
0.3000 mL | PREFILLED_SYRINGE | Freq: Once | INTRAMUSCULAR | 0 refills | Status: AC
  Filled 2024-07-10: qty 0.3, 1d supply, fill #0

## 2024-07-10 MED ORDER — FLUZONE HIGH-DOSE 0.5 ML IM SUSY
0.5000 mL | PREFILLED_SYRINGE | Freq: Once | INTRAMUSCULAR | 0 refills | Status: AC
  Filled 2024-07-10: qty 0.5, 1d supply, fill #0

## 2024-09-14 ENCOUNTER — Other Ambulatory Visit: Payer: Self-pay | Admitting: Family Medicine

## 2024-11-13 ENCOUNTER — Ambulatory Visit

## 2024-11-20 ENCOUNTER — Encounter: Admitting: Family Medicine

## 2024-11-26 ENCOUNTER — Encounter: Payer: Medicare Other | Admitting: Family Medicine

## 2024-12-05 ENCOUNTER — Ambulatory Visit

## 2025-06-04 ENCOUNTER — Ambulatory Visit: Admitting: Dermatology
